# Patient Record
Sex: Female | Born: 2000 | Hispanic: Yes | Marital: Single | Smoking: Never smoker
Health system: Southern US, Community
[De-identification: ages and names within clinical notes are randomized; demographics above are authoritative.]

## PROBLEM LIST (undated history)

## (undated) DIAGNOSIS — J45909 Unspecified asthma, uncomplicated: Secondary | ICD-10-CM

## (undated) HISTORY — PX: NO PAST SURGERIES: SHX2092

---
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
## 2020-01-15 ENCOUNTER — Encounter: Payer: Self-pay | Admitting: Women's Health

## 2020-01-15 ENCOUNTER — Ambulatory Visit (INDEPENDENT_AMBULATORY_CARE_PROVIDER_SITE_OTHER): Payer: Medicaid Other | Admitting: Women's Health

## 2020-01-15 ENCOUNTER — Other Ambulatory Visit: Payer: Medicaid Other

## 2020-01-15 VITALS — BP 109/73 | HR 100 | Wt 185.6 lb

## 2020-01-15 DIAGNOSIS — Z131 Encounter for screening for diabetes mellitus: Secondary | ICD-10-CM

## 2020-01-15 DIAGNOSIS — Z3402 Encounter for supervision of normal first pregnancy, second trimester: Secondary | ICD-10-CM

## 2020-01-15 DIAGNOSIS — Z3A25 25 weeks gestation of pregnancy: Secondary | ICD-10-CM | POA: Diagnosis not present

## 2020-01-15 DIAGNOSIS — Z1389 Encounter for screening for other disorder: Secondary | ICD-10-CM

## 2020-01-15 DIAGNOSIS — Z331 Pregnant state, incidental: Secondary | ICD-10-CM

## 2020-01-15 DIAGNOSIS — Z3480 Encounter for supervision of other normal pregnancy, unspecified trimester: Secondary | ICD-10-CM

## 2020-01-15 LAB — POCT URINALYSIS DIPSTICK OB
Blood, UA: NEGATIVE
Glucose, UA: NEGATIVE
Ketones, UA: NEGATIVE
Leukocytes, UA: NEGATIVE
Nitrite, UA: NEGATIVE
POC,PROTEIN,UA: NEGATIVE

## 2020-01-15 NOTE — Progress Notes (Signed)
LOW-RISK PREGNANCY VISIT Patient name: Nichole [name] MRN [id number]  Date of birth: [birth year] Chief Complaint:   Routine Prenatal Visit  History of Present Illness:   Nichole [name] is a 19 y.o. G1P0 female at [redacted]w[redacted]d with an Estimated Date of Delivery: 04/23/20 being seen today for ongoing management of a low-risk pregnancy.  Depression screen [facility] 2/9 10/21/2019 08/26/2019 02/26/2019 11/21/2017 05/15/2017  Decreased Interest 0 0 0 0 0  Down, Depressed, Hopeless 0 0 0 0 0  PHQ - 2 Score 0 0 0 0 0  Altered sleeping 0 - - - -  Tired, decreased energy 1 - - - -  Change in appetite 0 - - - -  Feeling bad or failure about yourself  0 - - - -  Trouble concentrating 1 - - - -  Moving slowly or fidgety/restless 0 - - - -  Suicidal thoughts 0 - - - -  PHQ-9 Score 2 - - - -    Today she reports one breast leaking a little milk. Contractions: Irritability. Vag. Bleeding: None.  Movement: Present. denies leaking of fluid. Review of Systems:   Pertinent items are noted in HPI Denies abnormal vaginal discharge w/ itching/odor/irritation, headaches, visual changes, shortness of breath, chest pain, abdominal pain, severe nausea/vomiting, or problems with urination or bowel movements unless otherwise stated above. Pertinent History Reviewed:  Reviewed past medical,surgical, social, obstetrical and family history.  Reviewed problem list, medications and allergies. Physical Assessment:   Vitals:   01/15/20 0935  BP: 109/73  Pulse: 100  Weight: 185 lb 9.6 oz (84.2 kg)  Body mass index is 31.86 kg/m.        Physical Examination:   General appearance: Well appearing, and in no distress  Mental status: Alert, oriented to person, place, and time  Skin: Warm & dry  Cardiovascular: Normal heart rate noted  Respiratory: Normal respiratory effort, no distress  Abdomen: Soft, gravid, nontender  Pelvic: Cervical exam deferred         Extremities:    Fetal Status:     Movement: Present    Chaperone: Clint Bolder   Results for orders placed or performed in visit on 01/15/20 (from the past 24 hour(s))  POC Urinalysis Dipstick OB   Collection Time: 01/15/20  9:38 AM  Result Value Ref Range   Color, UA     Clarity, UA     Glucose, UA Negative Negative   Bilirubin, UA     Ketones, UA neg    Spec Grav, UA     Blood, UA neg    pH, UA     POC,PROTEIN,UA Negative Negative, Trace, Small (1+), Moderate (2+), Large (3+), 4+   Urobilinogen, UA     Nitrite, UA neg    Leukocytes, UA Negative Negative   Appearance     Odor      Assessment & Plan:  1) Low-risk pregnancy G1P0 at [redacted]w[redacted]d with an Estimated Date of Delivery: 04/23/20    Meds: No orders of the defined types were placed in this encounter.  Labs/procedures today: pn2  Plan:  Continue routine obstetrical care  Next visit: prefers in person    Reviewed: Preterm labor symptoms and general obstetric precautions including but not limited to vaginal bleeding, contractions, leaking of fluid and fetal movement were reviewed in detail with the patient.  All questions were answered. Has home bp cuff.  Check bp weekly, let us know if >140/90.   Follow-up: No follow-ups on file.  Orders Placed  This Encounter  Procedures   POC Urinalysis Dipstick OB   Cheral Marker CNM, [facility] 01/15/2020 9:43 AM

## 2020-01-15 NOTE — Patient Instructions (Signed)
Nichole [name], I greatly value your feedback.  If you receive a survey following your visit with [name] today, we appreciate you taking the time to fill it out.  Thanks, [name], CNM, WHNP-BC   [facility] ([address] [place], [place] [address]) Entrance C, located off of [address] valet parking  Go to [email or web address] to register for FREE online childbirth classes   Call the office [phone]) or go to [facility] if:  You begin to have strong, frequent contractions  Your water breaks.  Sometimes it is a big gush of fluid, sometimes it is just a trickle that keeps getting your panties wet or running down your legs  You have vaginal bleeding.  It is normal to have a small amount of spotting if your cervix was checked.   You don't feel your baby moving like normal.  If you don't, get you something to eat and drink and lay down and focus on feeling your baby move.  You should feel at least 10 movements in 2 hours.  If you don't, you should call the office or go to [facility].    Tdap Vaccine  It is recommended that you get the Tdap vaccine during the third trimester of EACH pregnancy to help protect your baby from getting pertussis (whooping cough)  27-36 weeks is the BEST time to do this so that you can pass the protection on to your baby. During pregnancy is better than after pregnancy, but if you are unable to get it during pregnancy it will be offered at the hospital.   You can get this vaccine with [name], at the health department, your family doctor, or some local pharmacies  Everyone who will be around your baby should also be up-to-date on their vaccines before the baby comes. Adults (who are not pregnant) only need 1 dose of Tdap during adulthood.    Pediatricians/Family Doctors:  [facility] [phone]            [facility] [phone]                 [facility]  [phone] (usually not accepting new patients unless you have family there already, you are always welcome to call and ask)       [facility] Department [phone]       [facility] Pediatricians/Family Doctors:   [facility]: [phone]  [facility]: [phone]  [facility]: [phone]  [facility] Doctors:   [facility]: [phone]   [facility]: [phone]  [facility] Doctors:  [facility]: [phone]   Home Blood Pressure Monitoring for Patients   Your provider has recommended that you check your blood pressure (BP) at least once a week at home. If you do not have a blood pressure cuff at home, one will be provided for you. Contact your provider if you have not received your monitor within 1 week.   Helpful Tips for Accurate Home Blood Pressure Checks  . Don't smoke, exercise, or drink caffeine 30 minutes before checking your BP . Use the restroom before checking your BP (a full bladder can raise your pressure) . Relax in a comfortable upright chair . Feet on the ground . Left arm resting comfortably on a flat surface at the level of your heart . Legs uncrossed . Back supported . Sit quietly and don't talk . Place the cuff on your bare  arm . Adjust snuggly, so that only two fingertips can fit between your skin and the top of the cuff . Check 2 readings separated by at least one minute . Keep a log of your BP readings . For a visual, please reference this diagram: http://ccnc.care/bpdiagram  Provider Name: [facility]     Phone: [phone]  Zone 1: ALL CLEAR  Continue to monitor your symptoms:  . BP reading is less than 140 (top [name]) or less than 90 (bottom [name])  . No right upper stomach pain . No headaches or seeing spots . No feeling nauseated or throwing up . No swelling in face and hands  Zone 2: CAUTION Call your  doctor's office for any of the following:  . BP reading is greater than 140 (top [name]) or greater than 90 (bottom [name])  . Stomach pain under your ribs in the middle or right side . Headaches or seeing spots . Feeling nauseated or throwing up . Swelling in face and hands  Zone 3: EMERGENCY  Seek immediate medical care if you have any of the following:  . BP reading is greater than160 (top [name]) or greater than 110 (bottom [name]) . Severe headaches not improving with Tylenol . Serious difficulty catching your breath . Any worsening symptoms from Zone 2    Tercer trimestre de Psychiatrist Third Trimester of Pregnancy El tercer trimestre comprende desde la [employer] la semana40 (desde el mes7 hasta el mes9). El tercer trimestre es un perodo en el que el beb en gestacin (feto) crece rpidamente. Hacia el final del noveno mes, el feto mide alrededor de 20pulgadas (45cm) de largo y pesa entre 6 y 10 libras (2,700 y 57,500kg). Cambios en el cuerpo durante el tercer trimestre Su organismo continuar atravesando por muchos cambios durante el [place]. Estos cambios varan de [place]. Durante el tercer trimestre:  Seguir aumentando de [place]. Es de esperar que aumente entre 25 y 35libras (11 y 16kg) hacia el final del Psychiatrist.  Podrn aparecer las primeras Albertson's caderas, el abdomen y las [place].  Puede tener necesidad de Geographical information systems officer con ms frecuencia porque el feto baja hacia la pelvis y ejerce presin sobre la vejiga.  Puede desarrollar o continuar teniendo Merchant navy officer. Esto se debe a que el aumento de las hormonas hace que los msculos en el tubo digestivo trabajen ms lentamente.  Puede desarrollar o continuar teniendo estreimiento debido a que el aumento de las hormonas ralentiza la digestin y hace que los msculos que [employer] desechos a travs de los intestinos se relajen.  Puede desarrollar hemorroides. Estas son venas hinchadas (venas varicosas)  en el recto que pueden causar picazn o dolor.  Puede desarrollar venas hinchadas y abultadas (venas varicosas) en las piernas.  Puede presentar ms dolor en la pelvis, la espalda o los muslos. Esto se debe al Citigroup de peso y al aumento de las hormonas que relajan las articulaciones.  Tal vez haya cambios en el cabello. Esto cambios pueden incluir su engrosamiento, crecimiento rpido y [employer] Waste Industries textura. Adems, a algunas mujeres se les cae el cabello durante o despus del embarazo, o tienen el cabello seco o fino. Lo ms probable es que el cabello se le normalice despus del nacimiento del beb.  Sus pechos seguirn creciendo y se pondrn cada vez ms sensibles. Un lquido amarillo Charity fundraiser) puede salir de sus pechos. Esta es la primera leche que usted produce para su beb.  El ombligo puede salir May  afuera.  Puede observar que se le Eli Lilly and Company, el rostro o los tobillos.  Puede presentar un aumento del hormigueo o entumecimiento en las manos, brazos y piernas. La piel de su vientre tambin puede sentirse entumecida.  Puede sentir que le falta el aire debido a que se expande el tero.  Puede tener ms problemas para dormir. Esto puede deberse al tamao de su vientre, una mayor necesidad de Geographical information systems officer y un aumento en el metabolismo de su cuerpo.  Puede notar que el feto "baja" o lo siente ms bajo, en el abdomen (aligeramiento).  Puede tener un aumento de la secrecin vaginal.  Puede notar que las articulaciones se sienten flojas y puede sentir dolor alrededor del hueso plvico. Qu debe esperar en las visitas prenatales Le harn exmenes prenatales cada 2semanas hasta la semana36. A partir de ese momento le harn los Lex[employer] International. Durante una visita prenatal de rutina:  La pesarn para asegurarse de que usted y el beb estn creciendo normalmente.  Le tomarn la presin arterial.  Le medirn el abdomen para controlar el desarrollo del beb.  Se escucharn los  latidos cardacos fetales.  Se evaluarn los resultados de los estudios solicitados en visitas anteriores.  Le revisarn el cuello del tero cuando est prxima la fecha de parto para controlar si el cuello uterino se ha afinado o adelgazado (borrado).  Le harn una prueba de estreptococos del grupo B. Esto sucede AutoNation 35 y 33. El mdico puede preguntarle lo siguiente:  Cmo le gustara que fuera el [place].  Cmo se siente.  Si siente los movimientos del beb.  Si ha tenido sntomas anormales, como prdida de lquido, [place], dolores de cabeza intensos o clicos abdominales.  Si est consumiendo algn producto que contenga tabaco, como cigarrillos, tabaco de Theatre manager y Administrator, Civil Service.  Si tiene Colgate-Palmolive. Otros exmenes o estudios de deteccin que pueden realizarse durante el tercer trimestre incluyen lo siguiente:  Anlisis de sangre para controlar los niveles de hierro (anemia).  Controles fetales para determinar su salud, nivel de [place] y Designer, jewellery. Si tiene [place] enfermedad o hay problemas durante el embarazo, le harn estudios.  Prueba sin estrs. Esta prueba verifica la salud de su beb y se [place] para Engineer, manufacturing signos de problemas, tales como si el beb no est recibiendo suficiente oxgeno. Durante esta prueba, se coloca un cinturn alrededor de su vientre. Al moverse el beb, se controla su frecuencia cardaca. Qu es el falso [place] de [place]? El falso trabajo de parto es una afeccin en la que se sienten pequeos e irregulares espasmos de los msculos del tero (contracciones) que generalmente desaparecen al hacer reposo, cambiar de posicin o al beber agua. Estas contracciones se llaman contracciones de CSX Corporation. Las Fifth Third Bancorp pueden durar horas, [address] o incluso semanas, antes de que el verdadero trabajo de parto se inicie. Si las contracciones ocurren a intervalos regulares, se vuelven ms frecuentes, aumentan en intensidad o se  vuelven dolorosas, debera ver al mdico.  Cules son los signos del [place] de [place]?  Clicos abdominales.  Contracciones regulares que comienzan en intervalos de 10 minutos y se vuelven ms fuertes y ms frecuentes con el tiempo.  Contracciones que comienzan en la parte superior del tero y se extienden hacia abajo, a la zona inferior del abdomen y la espalda.  Aumento de la presin en la pelvis y Armed forces logistics/support/administrative officer en la espalda.  Una secrecin de mucosidad acuosa o con sangre que sale de la vagina.  Prdida de  lquido amnitico. Esto tambin se conoce como "ruptura de la bolsa de las aguas". Esto puede ser un chorro o un goteo constante y lento de lquido. Informe a su mdico si tiene un color u [employer]. Si tiene alguno de F[employer], llame a su mdico de inmediato, incluso si es antes de la fecha de [place]. Siga estas indicaciones en su casa: Medicamentos  Siga las indicaciones del mdico en relacin con el uso de medicamentos. Durante el embarazo, hay medicamentos que pueden tomarse y otros que no.  Tome vitaminas prenatales que contengan por lo menos (?g) de cido flico.  Si est estreida, tome un laxante suave, si el mdico lo autoriza. Qu debe comer y beber   Meriel Flavors una dieta equilibrada que incluya gran cantidad de frutas y verduras frescas, cereales integrales, buenas fuentes de protenas como carnes [place], huevos o tofu, y lcteos descremados. El mdico la ayudar a Production assistant, radio cantidad de peso que puede [place].  No coma carne cruda ni quesos sin cocinar. Estos elementos contienen grmenes que pueden causar defectos congnitos en el beb.  Si no consume muchos alimentos con calcio, hable con su mdico sobre si debera tomar un suplemento diario de calcio.  La ingesta diaria de cuatro o cinco comidas pequeas en lugar de tres comidas abundantes.  Limite el consumo de alimentos con alto contenido de grasas y azcares procesados, como alimentos  fritos o dulces.  Para evitar el estreimiento: ? Bebe suficiente lquido para mantener la orina clara o de color amarillo plido. ? Consuma alimentos ricos en fibra, como frutas y verduras frescas, cereales integrales y frijoles. Actividad  Haga ejercicio solamente como se lo haya indicado el mdico. La mayora de las mujeres pueden continuar su rutina de ejercicios durante el [place]. Intente realizar como mnimo de actividad fsica por lo menos 5das a la semana. Deje de hacer ejercicio si experimenta contracciones uterinas.  Evite levantar pesos [employer] Brands.  No haga ejercicio en condiciones de calor o humedad extremas, o a grandes alturas.  Use zapatos cmodos de tacn bajo.  Adopte una buena postura.  Puede seguir teniendo [employer] [place] [place] Travelers, excepto que el mdico le diga lo contrario. Alivio del dolor y del [place] pausas frecuentes y descanse con las piernas elevadas si tiene calambres en las piernas o dolor en la zona lumbar.  Dese baos de asiento con agua tibia para Engineer, materials o las molestias causadas por las hemorroides. Use una crema para las hemorroides si el mdico la autoriza.  Use un sostn que le brinde buen soporte para prevenir las molestias causadas por la sensibilidad en los pechos.  Si tiene venas varicosas: ? Use pantimedias que brinden soporte o medias de compresin como se lo haya indicado el mdico. ? Eleve los pies durante , 3 o 4veces por da. Cuidados prenatales  Escriba sus preguntas. Llvelas cuando concurra a las visitas prenatales.  Concurra a todas las visitas prenatales tal como se lo haya indicado el mdico. Esto es importante. Seguridad  Use el cinturn de seguridad en todo momento mientras conduce.  Haga una lista de los nmeros de telfono de Associate Professor, que W. [employer] nmeros de telfono de familiares, [place], el hospital y los departamentos de polica y bomberos. Instrucciones generales  Evite el  contacto con las bandejas sanitarias de los gatos y la tierra que estos animales usan. Estos elementos contienen grmenes que pueden causar defectos congnitos en el beb. Si tiene Financial controller, pdale a alguien que limpie la caja  de arena por usted.  No haga viajes largos excepto que sea absolutamente necesario y solo con la autorizacin de su mdico.  No se d baos de inmersin en agua caliente, baos turcos ni saunas.  No beber alcohol.  No consuma ningn producto que contenga nicotina o tabaco, como cigarrillos y Administrator, Civil Service. Si necesita ayuda para dejar de fumar, consulte al mdico.  No use hierbas medicinales ni medicamentos que no le hayan recetado. Estas sustancias qumicas afectan la formacin y el desarrollo del beb.  No se haga duchas vaginales ni use tampones o toallas higinicas perfumadas.  No mantenga las piernas cruzadas durante largos periodos de [place].  Para prepararse para la llegada de su beb: ? Tome clases prenatales para entender, Education administrator, y hacer preguntas sobre el P[place] de parto y [facility]. ? Haga un ensayo de la partida al hospital. ? Visite el hospital y recorra el rea de maternidad. ? Pida un permiso de maternidad o paternidad a sus empleadores. ? Organice para que Research scientist (physical sciences) o amigo cuide a sus mascotas mientras usted est en el hospital. ? Compre un asiento de seguridad FirstEnergy Corp, y asegrese de saber cmo instalarlo en su automvil. ? Prepare el bolso que llevar al hospital. ? Prepare la habitacin del beb. Asegrese de quitar todas las almohadas y [place] de peluche de la cuna del beb para evitar la asfixia.  Visite a su dentista si no lo ha Occupational hygienist. Use un cepillo de dientes blando para higienizarse los dientes y psese el hilo dental con suavidad. Comunquese con un mdico si:  No est segura de que est en trabajo de parto o de que ha roto la bolsa de las aguas.  Se siente mareada.  Siente  clicos leves, presin en la pelvis o dolor persistente en el abdomen.  Siente dolor en la parte inferior de la espalda.  Tiene nuseas, vmitos o diarrea persistentes.  Brett Fairy secrecin vaginal inusual o con mal olor.  Siente dolor al [employer]oco[employer]illips. Solicite ayuda de inmediato si:  Rompe la bolsa de las aguas antes de la semana 37.  Tiene contracciones regulares en intervalos de menos de 5 minutos antes de la semana 37.  Tiene fiebre.  Tiene una prdida de lquido por la vagina.  Tiene sangrado o pequeas prdidas vaginales.  Tiene dolor o clicos abdominales intensos.  Baja de peso o sube de peso rpidamente.  Tiene dificultad para respirar y siente dolor de pecho.  Sbitamente se le hinchan mucho el rostro, las [place], los tobillos, los pies o las piernas.  Su beb se mueve menos de 10 veces en 2 horas.  Siente un dolor de cabeza intenso que no se alivia al tomar United Parcel.  Nota cambios en la visin. Resumen  El tercer trimestre comprende desde la semana28 [employer] semana40, es decir, desde el mes7 hasta el [address] El tercer trimestre es un perodo en el que el beb en gestacin (feto) crece rpidamente.  Durante el tercer trimestre, su incomodidad puede aumentar a medida que usted y su beb continan aumentando de [place]. Es posible que tenga dolor abdominal, en las piernas y en la [place], [place] para dormir y [place] mayor necesidad de Geographical information systems officer.  Durante el tercer trimestre, sus pechos seguirn creciendo y se pondrn cada vez ms sensibles. Un lquido amarillo Charity fundraiser) puede salir de sus pechos. Esta es la primera leche que usted produce para su beb.  El falso trabajo de parto es una afeccin en la que se sienten  pequeos e irregulares espasmos de los msculos del tero (contracciones) que a Corporate investment bankerla larga desaparecen. Estas contracciones se llaman contracciones de CSX CorporationBraxton Hicks. Las Fifth Third Bancorpcontracciones pueden durar horas, [address]  [address] o incluso semanas, antes de que el verdadero trabajo  de parto se inicie.  Los signos del trabajo de parto pueden incluir: calambres abdominales; contracciones regulares que comienzan en intervalos de 10 minutos y se vuelven ms fuertes y ms frecuentes con el tiempo; una secrecin de mucosidad acuosa o con sangre que sale de la vagina; aumento de la presin en la pelvis y Engineer, miningdolor latente en la espalda; y prdida de lquido amnitico. Esta informacin no tiene Theme park managercomo fin reemplazar el consejo del mdico. Asegrese de hacerle al mdico cualquier pregunta que tenga. Document Revised: 07/17/2016 Document Reviewed: 07/17/2016 Elsevier Patient Education  2020 [employer]

## 2020-01-16 LAB — CBC
Hematocrit: 36 % (ref 34.0–46.6)
Hemoglobin: 12 g/dL (ref 11.1–15.9)
MCH: 29.3 pg (ref 26.6–33.0)
MCHC: 33.3 g/dL (ref 31.5–35.7)
MCV: 88 fL (ref 79–97)
Platelets: 191 10*3/uL (ref 150–450)
RBC: 4.09 x10E6/uL (ref 3.77–5.28)
RDW: 12.3 % (ref 11.7–15.4)
WBC: 7.9 10*3/uL (ref 3.4–10.8)

## 2020-01-16 LAB — GLUCOSE TOLERANCE, 2 HOURS W/ 1HR
Glucose, 1 hour: 116 mg/dL (ref 65–179)
Glucose, 2 hour: 96 mg/dL (ref 65–152)
Glucose, Fasting: 74 mg/dL (ref 65–91)

## 2020-01-16 LAB — RPR: RPR Ser Ql: NONREACTIVE

## 2020-01-16 LAB — ANTIBODY SCREEN: Antibody Screen: NEGATIVE

## 2020-01-16 LAB — HIV ANTIBODY (ROUTINE TESTING W REFLEX): HIV Screen 4th Generation wRfx: NONREACTIVE

## 2020-02-09 DIAGNOSIS — Z23 Encounter for immunization: Secondary | ICD-10-CM | POA: Diagnosis not present

## 2020-02-09 DIAGNOSIS — S91332A Puncture wound without foreign body, left foot, initial encounter: Secondary | ICD-10-CM | POA: Diagnosis not present

## 2020-02-13 ENCOUNTER — Inpatient Hospital Stay (HOSPITAL_COMMUNITY)
Admission: EM | Admit: 2020-02-13 | Discharge: 2020-02-13 | Disposition: A | Discharge status: Home / Self Care | Payer: Medicaid Other | Attending: Family Medicine | Admitting: Family Medicine

## 2020-02-13 ENCOUNTER — Encounter (HOSPITAL_COMMUNITY): Payer: Self-pay | Admitting: Family Medicine

## 2020-02-13 ENCOUNTER — Other Ambulatory Visit: Payer: Self-pay

## 2020-02-13 DIAGNOSIS — O212 Late vomiting of pregnancy: Secondary | ICD-10-CM | POA: Diagnosis not present

## 2020-02-13 DIAGNOSIS — R197 Diarrhea, unspecified: Secondary | ICD-10-CM | POA: Insufficient documentation

## 2020-02-13 DIAGNOSIS — R1084 Generalized abdominal pain: Secondary | ICD-10-CM | POA: Insufficient documentation

## 2020-02-13 DIAGNOSIS — E876 Hypokalemia: Secondary | ICD-10-CM | POA: Insufficient documentation

## 2020-02-13 DIAGNOSIS — O99891 Other specified diseases and conditions complicating pregnancy: Secondary | ICD-10-CM | POA: Diagnosis not present

## 2020-02-13 DIAGNOSIS — Z3A30 30 weeks gestation of pregnancy: Secondary | ICD-10-CM | POA: Insufficient documentation

## 2020-02-13 DIAGNOSIS — O99283 Endocrine, nutritional and metabolic diseases complicating pregnancy, third trimester: Secondary | ICD-10-CM | POA: Insufficient documentation

## 2020-02-13 DIAGNOSIS — O26893 Other specified pregnancy related conditions, third trimester: Secondary | ICD-10-CM | POA: Diagnosis present

## 2020-02-13 DIAGNOSIS — R195 Other fecal abnormalities: Secondary | ICD-10-CM

## 2020-02-13 DIAGNOSIS — N858 Other specified noninflammatory disorders of uterus: Secondary | ICD-10-CM | POA: Diagnosis not present

## 2020-02-13 DIAGNOSIS — R112 Nausea with vomiting, unspecified: Secondary | ICD-10-CM

## 2020-02-13 LAB — CBC
HCT: 32.7 % — ABNORMAL LOW (ref 36.0–46.0)
Hemoglobin: 11.1 g/dL — ABNORMAL LOW (ref 12.0–15.0)
MCH: 29 pg (ref 26.0–34.0)
MCHC: 33.9 g/dL (ref 30.0–36.0)
MCV: 85.4 fL (ref 80.0–100.0)
Platelets: 178 10*3/uL (ref 150–400)
RBC: 3.83 MIL/uL — ABNORMAL LOW (ref 3.87–5.11)
RDW: 12.1 % (ref 11.5–15.5)
WBC: 9.9 10*3/uL (ref 4.0–10.5)
nRBC: 0 % (ref 0.0–0.2)

## 2020-02-13 LAB — URINALYSIS, ROUTINE W REFLEX MICROSCOPIC
Bacteria, UA: NONE SEEN
Glucose, UA: NEGATIVE mg/dL
Hgb urine dipstick: NEGATIVE
Ketones, ur: 5 mg/dL — AB
Nitrite: NEGATIVE
Protein, ur: 100 mg/dL — AB
Specific Gravity, Urine: 1.026 (ref 1.005–1.030)
pH: 7 (ref 5.0–8.0)

## 2020-02-13 LAB — COMPREHENSIVE METABOLIC PANEL
ALT: 12 U/L (ref 0–44)
AST: 17 U/L (ref 15–41)
Albumin: 3.1 g/dL — ABNORMAL LOW (ref 3.5–5.0)
Alkaline Phosphatase: 68 U/L (ref 38–126)
Anion gap: 13 (ref 5–15)
BUN: <5 mg/dL — ABNORMAL LOW (ref 6–20)
CO2: 22 mmol/L (ref 22–32)
Calcium: 9.1 mg/dL (ref 8.9–10.3)
Chloride: 102 mmol/L (ref 98–111)
Creatinine, Ser: 0.52 mg/dL (ref 0.44–1.00)
GFR, Estimated: >60 mL/min (ref >60)
Glucose, Bld: 116 mg/dL — ABNORMAL HIGH (ref 70–99)
Potassium: 3.2 mmol/L — ABNORMAL LOW (ref 3.5–5.1)
Sodium: 137 mmol/L (ref 135–145)
Total Bilirubin: 0.6 mg/dL (ref 0.3–1.2)
Total Protein: 6.4 g/dL — ABNORMAL LOW (ref 6.5–8.1)

## 2020-02-13 LAB — FETAL FIBRONECTIN: Fetal Fibronectin: NEGATIVE

## 2020-02-13 MED ORDER — SIMETHICONE 80 MG PO CHEW
80.0000 mg | CHEWABLE_TABLET | Freq: Once | ORAL | Status: AC
  Administered 2020-02-13: 80 mg via ORAL
  Filled 2020-02-13: qty 1

## 2020-02-13 MED ORDER — PROMETHAZINE HCL 25 MG PO TABS: 25.0000 mg | ORAL_TABLET | Freq: Four times a day (QID) | ORAL | 2 refills | Status: DC | PRN

## 2020-02-13 MED ORDER — POTASSIUM CHLORIDE ER 10 MEQ PO TBCR: 10.0000 meq | EXTENDED_RELEASE_TABLET | Freq: Every day | ORAL | 0 refills | Status: DC

## 2020-02-13 MED ORDER — LACTATED RINGERS IV SOLN: Freq: Once | INTRAVENOUS | Status: AC

## 2020-02-13 MED ORDER — HYOSCYAMINE SULFATE 0.125 MG SL SUBL
0.1250 mg | SUBLINGUAL_TABLET | Freq: Once | SUBLINGUAL | Status: AC
  Administered 2020-02-13: 0.125 mg via SUBLINGUAL
  Filled 2020-02-13: qty 1

## 2020-02-13 NOTE — MAU Note (Signed)
PT SAYS SHE WENT TO ER - LOWER ABD - STARTED AT 7 PM- . NOW FEELS LESS THAN AT 7 . [facility].

## 2020-02-13 NOTE — Discharge Instructions (Signed)
Hipopotasemia Hypokalemia Hipopotasemia significa que el nivel de potasio en sangre es menor que lo normal. El potasio es una sustancia qumica (electrolito) que ayuda a regular la cantidad de lquido en el organismo. Tambin estimula la tensin (contraccin) muscular y ayuda a que los nervios funcionen correctamente. Normalmente, la mayor parte del potasio del organismo se encuentra dentro de las clulas y solo una cantidad muy pequea est en la sangre. Debido a que la cantidad en la sangre es tan pequea, un cambio pequeo en los niveles de potasio en la sangre puede ser potencialmente mortal. Cules son las causas? Esta afeccin puede ser causada por lo siguiente:  Antibiticos.  Diarrea o vmitos. Tomar demasiada cantidad de un medicamento que lo ayuda a Warehouse manager deposiciones (laxante) puede causar diarrea y derivar en hipopotasemia.  Enfermedad renal crnica (ERC).  Medicamentos que ayudan al cuerpo a eliminar el exceso de lquido (diurticos).  Trastornos de Psychologist, sport and exercise, como la bulimia.  Niveles bajos de [employer].  Sudoracin abundante. Cules son los signos o los sntomas? Los sntomas de esta afeccin incluyen:  Debilidad.  Estreimiento.  Fatiga.  Calambres musculares.  Confusin mental.  Latidos cardacos salteados o irregulares (palpitaciones).  Hormigueo o entumecimiento. Cmo se diagnostica? Esta afeccin se diagnostica con un anlisis de sangre. Cmo se trata? El tratamiento para esta afeccin puede incluir lo siguiente:  Consumo de suplementos de potasio por la boca.  Ajuste de los medicamentos que toma.  Consumo de ms alimentos que contengan una gran cantidad de potasio. Si sus niveles de potasio son muy bajos, posiblemente sea necesario que reciba potasio a travs de una va intravenosa y se lo controle en el hospital. Siga estas indicaciones en su casa:   Tome los medicamentos de venta libre y los recetados solamente como se  lo haya indicado el mdico. Esto incluye las vitaminas y suplementos.  Siga una dieta saludable. Una dieta saludable incluye frutas y verduras frescas, cereales integrales, grasas saludables y protenas magras.  Si se lo indican, consuma ms alimentos que contengan una gran cantidad de potasio. Esto puede comprender lo siguiente: ? Frutos secos, como cacahuetes y Psychologist, prison and probation services. ? Semillas, como semillas de girasol y de Land. ? Porotos, guisantes secos y lentejas. ? Granos enteros y panes y cereales con salvado. ? [place] y verduras frescas, como damascos, palta, bananas, meln, kiwi, naranjas, tomates, esprragos y papas. ? Jugo de naranjas. ? Jugo de tomate. ? Carnes rojas. ? Yogur.  Concurra a todas las visitas de control como se lo haya indicado el mdico. Esto es importante. Comunquese con un mdico si:  Tiene debilidad que empeora.  Siente que el corazn late fuerte o est acelerado.  Vomita.  Tiene diarrea.  Tiene diabetes (diabetes mellitus) y tiene problemas para mantener el nivel de azcar en la sangre (glucosa) en el rango indicado. Solicite ayuda inmediatamente si:  Midwife.  Le falta el aire.  Tiene vmitos o diarrea durante ms de 2das.  Se desmaya. Resumen  Hipopotasemia significa que el nivel de potasio en sangre es menor que lo normal.  Esta afeccin se diagnostica con un anlisis de [place].  La hipopotasemia puede tratarse tomando suplementos de potasio, ajustando los medicamentos que toma o comiendo ms alimentos con alto contenido de Government social research officer.  Si sus niveles de potasio son muy bajos, posiblemente sea necesario que reciba potasio a travs de una va intravenosa y se lo controle en el hospital. Esta informacin no tiene Theme park manager el consejo del mdico.  Asegrese de hacerle al mdico cualquier pregunta que tenga. Document Revised: 12/04/2017 Document Reviewed: 12/04/2017 Elsevier Patient Education  2020 Elsevier  Inc.   Nuseas y vmitos en los adultos Nausea and Vomiting, Adult Las nuseas son la sensacin de Dentist en el estmago o de que se est por vomitar. Los vmitos se producen cuando los alimentos que estn en el estmago se expulsan por la boca. Vomitar puede hacerlo sentir dbil. Tambin puede hacerlo perder gran cantidad de agua del cuerpo (deshidratarse). Si pierde demasiada agua del cuerpo, es posible que usted:  Se sienta cansado.  Sienta sed.  Tenga la boca seca.  Tenga los labios agrietados.  Haga pis (orine) con menos frecuencia. Los [employer] y las personas que tienen otras enfermedades o un sistema de defensa del cuerpo (sistema inmunitario) dbil corren un mayor riesgo de perder demasiada agua del cuerpo. Si siente Journalist, newspaper y vomita, es importante que siga las indicaciones del mdico acerca de cmo cuidarse. Siga estas indicaciones en su casa: Controle sus sntomas para detectar cualquier cambio. Dgale a su mdico sobre ellos. Siga estas indicaciones para cuidarse en su hogar. Comida y bebida      Beverely Risen SRO (solucin de rehidratacin oral). Es Neomia Dear bebida que se vende en farmacias y tiendas.  Beba lquidos claros en pequeas cantidades segn le sea posible, por ejemplo: ? [place]. ? Trocitos de hielo. ? Jugo de frutas con agua agregada (jugo de frutas diluido). ? Bebidas deportivas de bajas caloras.  En la medida en que pueda, consuma alimentos blandos y fciles de digerir en pequeas cantidades, por ejemplo, los siguientes: ? Bananas. ? Pur de [employer]. ? Arroz. ? Carnes bajas en grasa Nonah Mattes). ? Pan tostado. ? Galletas.  Evite beber lquidos con alto contenido de azcar o cafena. Esto incluye bebidas energticas, bebidas deportivas y gaseosas.  Evite tomar alcohol.  Evite los alimentos condimentados o con alto contenido de [place]. Indicaciones generales  [employer] de venta libre y los recetados solamente como se lo haya  indicado el mdico.  Beba suficiente lquido para mantener el pis (la orina) de color amarillo plido.  Lvese las manos frecuentemente con agua y [place]. Use un desinfectante para manos si no dispone de [place] y [place].  Asegrese de que todas las personas que viven en su casa se laven bien las manos y con frecuencia.  Descanse en su casa hasta sentirse mejor.  Controle su afeccin para Insurance risk surveyor cambio.  Cuando Careers adviser, respire lenta y profundamente.  Concurra a todas las visitas de control como se lo haya indicado el mdico. Esto es importante. Comunquese con un mdico si:  Sus sntomas empeoran.  Aparecen nuevos sntomas.  Tiene fiebre.  No puede beber lquidos sin vomitar.  Siente malestar en el estmago durante ms de 2das.  Se siente aturdido o mareado.  Tiene dolor de [place].  Tiene calambres musculares.  Tiene una erupcin cutnea.  Siente dolor al [employer]. Solicite ayuda inmediatamente si:  [employer], el cuello, los brazos o la [place].  Se siente muy dbil o se desvanece (se desmaya).  Vomita repetidamente.  Vomita sangre de color rojo intenso o el vmito se asemeja al poso del caf.  Tiene deposiciones (heces) con sangre o de color negro, o con aspecto alquitranado.  Siente un dolor de cabeza intenso, rigidez en el cuello, o ambas cosas.  Tiene dolor muy intenso en el vientre (abdomen), clicos o meteorismo.  Tiene dificultad para  respirar.  Respira muy rpido.  Su corazn late muy rpidamente.  Siente la piel fra y hmeda.  Se siente confundido.  Tiene signos de haber perdido [place] cantidad excesiva de agua del cuerpo, como: ? [place], muy escasa o falta de [place]. ? Labios agrietados. ? Sequedad de boca. ? Ojos hundidos. ? Somnolencia. ? Debilidad. Estos sntomas pueden Customer service manager. No espere a ver si los sntomas desaparecen. Solicite atencin mdica de inmediato.  Comunquese con el servicio de emergencias de su localidad (911 en los [place]). No conduzca por sus propios medios [employer] hospital. Resumen  Las nuseas son la sensacin de Dentist en el estmago o de que se est por vomitar. Los vmitos se producen cuando los alimentos que estn en el estmago se expulsan por la boca.  Siga las indicaciones de su mdico acerca de qu comer y beber para evitar perder demasiada agua del cuerpo.  Tome los medicamentos de venta libre y los recetados solamente como se lo haya indicado el mdico.  Comunquese con su mdico si sus sntomas empeoran o si tiene nuevos sntomas.  Concurra a todas las visitas de control como se lo haya indicado el mdico. Esto es importante. Esta informacin no tiene Theme park manager el consejo del mdico. Asegrese de hacerle al mdico cualquier pregunta que tenga. Document Revised: 10/24/2017 Document Reviewed: 10/24/2017 Elsevier Patient Education  2020 [employer].

## 2020-02-13 NOTE — MAU Provider Note (Signed)
Chief Complaint:  Abdominal Pain   First Provider Initiated Contact with Patient 02/13/20 0141     HPI: Nichole [name] is a 19 y.o. G1P0 at 55w0dwho presents to maternity admissions reporting abdominal pain, loose stools and vomiting which all started earlier today.  No bleeding.  Has not tried anything for these symptoms.. She reports good fetal movement, denies LOF, vaginal bleeding, vaginal itching/burning, urinary symptoms, h/a, dizziness, constipation or fever/chills.    Abdominal Pain This is a new problem. The current episode started today. The onset quality is gradual. The problem occurs intermittently. The problem has been unchanged. The pain is located in the periumbilical region and generalized abdominal region. The quality of the pain is cramping and colicky. The abdominal pain radiates to the back. Associated symptoms include diarrhea (not frequent but loose), nausea and vomiting. Pertinent negatives include no anorexia, constipation, dysuria, fever, frequency, headaches or myalgias. Nothing aggravates the pain. The pain is relieved by nothing. She has tried nothing for the symptoms.     RN Note: PT SAYS SHE WENT TO ER - LOWER ABD - STARTED AT 7 PM- . NOW FEELS LESS THAN AT 7 . [facility] WITH  FAMILY [facility]  Past Medical History: Past Medical History:  Diagnosis Date  . Asthma     Past obstetric history: OB History  Gravida Para Term Preterm AB Living  1            SAB TAB Ectopic Multiple Live Births               # Outcome Date GA Lbr Len/2nd Weight Sex Delivery Anes PTL Lv  1 Current             Past Surgical History: Past Surgical History:  Procedure Laterality Date  . NO PAST SURGERIES      Family History: History reviewed. No pertinent family history.  Social History: Social History   Tobacco Use  . Smoking status: Never Smoker  . Smokeless tobacco: Never Used  Vaping Use  . Vaping Use: Never used  Substance Use Topics  . Alcohol use: No  . Drug use: No     Allergies: No Known Allergies  Meds:  Medications Prior to Admission  Medication Sig Dispense Refill Last Dose  . albuterol (VENTOLIN HFA) 108 (90 Base) MCG/ACT inhaler Inhale 2 puffs into the lungs every 6 (six) hours as needed for wheezing or shortness of breath. 18 g 1 02/12/2020 at Unknown time  . docusate sodium (COLACE) 250 MG capsule Take 1 capsule (250 mg total) by mouth 2 (two) times daily as needed for constipation. 30 capsule 4 Past Month at Unknown time  . Prenatal Vit-Fe Fumarate-FA (PRENATAL VITAMIN PO) Take by mouth.   Past Week at Unknown time  . Blood Pressure Monitor MISC For regular home bp monitoring during pregnancy 1 each 0   . loratadine (CLARITIN) 10 MG tablet Take 10 mg by mouth daily.   More than a month at Unknown time    I have reviewed patient's Past Medical Hx, Surgical Hx, Family Hx, Social Hx, medications and allergies.   ROS:  Review of Systems  Constitutional: Negative for fever.  Gastrointestinal: Positive for abdominal pain, diarrhea (not frequent but loose), nausea and vomiting. Negative for anorexia and constipation.  Genitourinary: Negative for dysuria and frequency.  Musculoskeletal: Negative for myalgias.  Neurological: Negative for headaches.   Other systems negative  Physical Exam   Patient Vitals for the past 24 hrs:  BP Temp Temp  src Pulse Resp SpO2 Height Weight  02/13/20 0108 107/60 98.2 F (36.8 C) Oral (!) 113 20 -- 5\' 4"  (1.626 m) 86 kg  02/13/20 0013 112/66 98 F (36.7 C) Oral (!) 114 (!) 22 99 % -- --   Constitutional: Well-developed, well-nourished female in no acute distress.  Cardiovascular: normal rate and rhythm Respiratory: normal effort, clear to auscultation bilaterally GI: Abd soft, non-tender, gravid appropriate for gestational age.   No rebound or guarding. MS: Extremities nontender, no edema, normal ROM Neurologic: Alert and oriented x 4.  GU: Neg CVAT.  PELVIC EXAM: Fetal fibronectin sent Dilation:  Closed Effacement (%): Thick Cervical Position: Posterior Station: Ballotable Exam by:: [phone], CNM   FHT:  Baseline 140 , moderate variability, accelerations present, no decelerations Contractions: uterine irritability   Labs: Results for orders placed or performed during the hospital encounter of 02/13/20 (from the past 24 hour(s))  Urinalysis, Routine w reflex microscopic     Status: Abnormal   Collection Time: 02/13/20  1:45 AM  Result Value Ref Range   Color, Urine AMBER (A) YELLOW   APPearance HAZY (A) CLEAR   Specific Gravity, Urine 1.026 1.005 - 1.030   pH 7.0 5.0 - 8.0   Glucose, UA NEGATIVE NEGATIVE mg/dL   Hgb urine dipstick NEGATIVE NEGATIVE   Bilirubin Urine SMALL (A) NEGATIVE   Ketones, ur 5 (A) NEGATIVE mg/dL   Protein, ur 02/15/20 (A) NEGATIVE mg/dL   Nitrite NEGATIVE NEGATIVE   Leukocytes,Ua SMALL (A) NEGATIVE   RBC / HPF 0-5 0 - 5 RBC/hpf   WBC, UA 21-50 0 - 5 WBC/hpf   Bacteria, UA NONE SEEN NONE SEEN   Squamous Epithelial / LPF 11-20 0 - 5   Mucus PRESENT    Hyaline Casts, UA PRESENT   Fetal fibronectin     Status: None   Collection Time: 02/13/20  1:55 AM  Result Value Ref Range   Fetal Fibronectin NEGATIVE NEGATIVE  CBC     Status: Abnormal   Collection Time: 02/13/20  2:10 AM  Result Value Ref Range   WBC 9.9 4.0 - 10.5 K/uL   RBC 3.83 (L) 3.87 - 5.11 MIL/uL   Hemoglobin 11.1 (L) 12.0 - 15.0 g/dL   HCT 02/15/20 (L) 36 - 46 %   MCV 85.4 80.0 - 100.0 fL   MCH 29.0 26.0 - 34.0 pg   MCHC 33.9 30.0 - 36.0 g/dL   RDW 64.4 03.4 - 74.2 %   Platelets 178 150 - 400 K/uL   nRBC 0.0 0.0 - 0.2 %  Comprehensive metabolic panel     Status: Abnormal   Collection Time: 02/13/20  2:10 AM  Result Value Ref Range   Sodium 137 135 - 145 mmol/L   Potassium 3.2 (L) 3.5 - 5.1 mmol/L   Chloride 102 98 - 111 mmol/L   CO2 22 22 - 32 mmol/L   Glucose, Bld 116 (H) 70 - 99 mg/dL   BUN <5 (L) 6 - 20 mg/dL   Creatinine, Ser 02/15/20 0.44 - 1.00 mg/dL   Calcium 9.1 8.9 -  6.38 mg/dL   Total Protein 6.4 (L) 6.5 - 8.1 g/dL   Albumin 3.1 (L) 3.5 - 5.0 g/dL   AST 17 15 - 41 U/L   ALT 12 0 - 44 U/L   Alkaline Phosphatase 68 38 - 126 U/L   Total Bilirubin 0.6 0.3 - 1.2 mg/dL   GFR, Estimated 75.6 >43 mL/min   Anion gap 13 5 - 15  O/Positive/-- (07/28 1444)  Imaging:  No results found.  MAU Course/MDM: I have ordered labs and reviewed results. No leukocytosis.  LFTs normal  Potassium is mildly depleted, likely from vomiting and loose stools NST reviewed, reactive We gave a liter of IVF which stopped irritability  Treatments in MAU included EFM, IV hydration.    Assessment: Pregnancy at [redacted]w[redacted]d Uterine irritability Loose stools Nausea and vomiting Mild hypokalemia   Plan: Discharge home Rx K-Dur x 3 days Rx Phenergan for nausea Advance diet as tolerated Preterm Labor precautions and fetal kick counts Follow up in Office for prenatal visits   Encouraged to return if she develops worsening of symptoms, increase in pain, fever, or other concerning symptoms.   Pt stable at time of discharge.  Wynelle Bourgeois CNM, MSN Certified Nurse-Midwife 02/13/2020 1:41 AM

## 2020-02-13 NOTE — ED Provider Notes (Signed)
12:33 AM G1P0 female, currently [redacted] weeks pregnant, presenting for abdominal pain.  States that she felt unwell earlier in the day with one episode of vomiting.  Began to have pain periumbilically around 1900 tonight.  Has been experiencing associated urinary urgency as well as discomfort across her back.  No vaginal bleeding, vaginal discharge, fevers, dysuria.  Followed by [name], CNM for prenatal care.  Accepted by midwife, Hilda Lias, at [facility] for evaluation.   Antony Madura, PA-C 02/13/20 0034    Ward, Layla Maw, DO 02/13/20 [id number]

## 2020-02-13 NOTE — ED Triage Notes (Addendum)
Pt said she was having some abdominal pain with nausea tonight. Pt said she did vomit twice. No bleeding, nothing leaking. Pt said she was having pain in her back into her legs. Pt is [redacted] weeks pregnant

## 2020-02-15 ENCOUNTER — Ambulatory Visit (INDEPENDENT_AMBULATORY_CARE_PROVIDER_SITE_OTHER): Payer: Medicaid Other | Admitting: Advanced Practice Midwife

## 2020-02-15 ENCOUNTER — Other Ambulatory Visit: Payer: Self-pay

## 2020-02-15 VITALS — BP 117/66 | HR 89 | Wt 190.0 lb

## 2020-02-15 DIAGNOSIS — Z331 Pregnant state, incidental: Secondary | ICD-10-CM

## 2020-02-15 DIAGNOSIS — Z3A30 30 weeks gestation of pregnancy: Secondary | ICD-10-CM

## 2020-02-15 DIAGNOSIS — Z23 Encounter for immunization: Secondary | ICD-10-CM

## 2020-02-15 DIAGNOSIS — Z1389 Encounter for screening for other disorder: Secondary | ICD-10-CM

## 2020-02-15 DIAGNOSIS — Z3403 Encounter for supervision of normal first pregnancy, third trimester: Secondary | ICD-10-CM

## 2020-02-15 LAB — POCT URINALYSIS DIPSTICK OB
Blood, UA: NEGATIVE
Glucose, UA: NEGATIVE
Ketones, UA: NEGATIVE
Leukocytes, UA: NEGATIVE
Nitrite, UA: NEGATIVE
POC,PROTEIN,UA: NEGATIVE

## 2020-02-15 NOTE — Addendum Note (Signed)
Addended by: Annamarie Dawley on: 02/15/2020 11:10 AM   Modules accepted: Orders

## 2020-02-15 NOTE — Patient Instructions (Signed)
Tercer trimestre de embarazo Third Trimester of Pregnancy El tercer trimestre comprende desde la semana28 hasta la semana40 (desde el mes7 hasta el mes9). El tercer trimestre es un perodo en el que el beb en gestacin (feto) crece rpidamente. Hacia el final del noveno mes, el feto mide alrededor de 20pulgadas (45cm) de largo y pesa entre 6 y 10 libras (2,700 y 4,500kg). Cambios en el cuerpo durante el tercer trimestre Su organismo continuar atravesando por muchos cambios durante el embarazo. Estos cambios varan de una mujer a otra. Durante el tercer trimestre:  Seguir aumentando de peso. Es de esperar que aumente entre 25 y 35libras (11 y 16kg) hacia el final del embarazo.  Podrn aparecer las primeras estras en las caderas, el abdomen y las mamas.  Puede tener necesidad de orinar con ms frecuencia porque el feto baja hacia la pelvis y ejerce presin sobre la vejiga.  Puede desarrollar o continuar teniendo acidez estomacal. Esto se debe a que el aumento de las hormonas hace que los msculos en el tubo digestivo trabajen ms lentamente.  Puede desarrollar o continuar teniendo estreimiento debido a que el aumento de las hormonas ralentiza la digestin y hace que los msculos que empujan los desechos a travs de los intestinos se relajen.  Puede desarrollar hemorroides. Estas son venas hinchadas (venas varicosas) en el recto que pueden causar picazn o dolor.  Puede desarrollar venas hinchadas y abultadas (venas varicosas) en las piernas.  Puede presentar ms dolor en la pelvis, la espalda o los muslos. Esto se debe al aumento de peso y al aumento de las hormonas que relajan las articulaciones.  Tal vez haya cambios en el cabello. Esto cambios pueden incluir su engrosamiento, crecimiento rpido y cambios en la textura. Adems, a algunas mujeres se les cae el cabello durante o despus del embarazo, o tienen el cabello seco o fino. Lo ms probable es que el cabello se le normalice  despus del nacimiento del beb.  Sus pechos seguirn creciendo y se pondrn cada vez ms sensibles. Un lquido amarillo (calostro) puede salir de sus pechos. Esta es la primera leche que usted produce para su beb.  El ombligo puede salir hacia afuera.  Puede observar que se le hinchan las manos, el rostro o los tobillos.  Puede presentar un aumento del hormigueo o entumecimiento en las manos, brazos y piernas. La piel de su vientre tambin puede sentirse entumecida.  Puede sentir que le falta el aire debido a que se expande el tero.  Puede tener ms problemas para dormir. Esto puede deberse al tamao de su vientre, una mayor necesidad de orinar y un aumento en el metabolismo de su cuerpo.  Puede notar que el feto "baja" o lo siente ms bajo, en el abdomen (aligeramiento).  Puede tener un aumento de la secrecin vaginal.  Puede notar que las articulaciones se sienten flojas y puede sentir dolor alrededor del hueso plvico. Qu debe esperar en las visitas prenatales Le harn exmenes prenatales cada 2semanas hasta la semana36. A partir de ese momento le harn los exmenes semanales. Durante una visita prenatal de rutina:  La pesarn para asegurarse de que usted y el beb estn creciendo normalmente.  Le tomarn la presin arterial.  Le medirn el abdomen para controlar el desarrollo del beb.  Se escucharn los latidos cardacos fetales.  Se evaluarn los resultados de los estudios solicitados en visitas anteriores.  Le revisarn el cuello del tero cuando est prxima la fecha de parto para controlar si el cuello uterino   se ha afinado o adelgazado (borrado).  Le harn una prueba de estreptococos del grupo B. Esto sucede entre las semanas 35 y 37. El mdico puede preguntarle lo siguiente:  Cmo le gustara que fuera el parto.  Cmo se siente.  Si siente los movimientos del beb.  Si ha tenido sntomas anormales, como prdida de lquido, sangrado, dolores de cabeza  intensos o clicos abdominales.  Si est consumiendo algn producto que contenga tabaco, como cigarrillos, tabaco de mascar y cigarrillos electrnicos.  Si tiene alguna pregunta. Otros exmenes o estudios de deteccin que pueden realizarse durante el tercer trimestre incluyen lo siguiente:  Anlisis de sangre para controlar los niveles de hierro (anemia).  Controles fetales para determinar su salud, nivel de actividad y crecimiento. Si tiene alguna enfermedad o hay problemas durante el embarazo, le harn estudios.  Prueba sin estrs. Esta prueba verifica la salud de su beb y se utiliza para detectar signos de problemas, tales como si el beb no est recibiendo suficiente oxgeno. Durante esta prueba, se coloca un cinturn alrededor de su vientre. Al moverse el beb, se controla su frecuencia cardaca. Qu es el falso trabajo de parto? El falso trabajo de parto es una afeccin en la que se sienten pequeos e irregulares espasmos de los msculos del tero (contracciones) que generalmente desaparecen al hacer reposo, cambiar de posicin o al beber agua. Estas contracciones se llaman contracciones de Braxton Hicks. Las contracciones pueden durar horas, das o incluso semanas, antes de que el verdadero trabajo de parto se inicie. Si las contracciones ocurren a intervalos regulares, se vuelven ms frecuentes, aumentan en intensidad o se vuelven dolorosas, debera ver al mdico.  Cules son los signos del trabajo de parto?  Clicos abdominales.  Contracciones regulares que comienzan en intervalos de 10 minutos y se vuelven ms fuertes y ms frecuentes con el tiempo.  Contracciones que comienzan en la parte superior del tero y se extienden hacia abajo, a la zona inferior del abdomen y la espalda.  Aumento de la presin en la pelvis y dolor latente en la espalda.  Una secrecin de mucosidad acuosa o con sangre que sale de la vagina.  Prdida de lquido amnitico. Esto tambin se conoce como  "ruptura de la bolsa de las aguas". Esto puede ser un chorro o un goteo constante y lento de lquido. Informe a su mdico si tiene un color u olor extrao. Si tiene alguno de estos signos, llame a su mdico de inmediato, incluso si es antes de la fecha de parto. Siga estas indicaciones en su casa: Medicamentos  Siga las indicaciones del mdico en relacin con el uso de medicamentos. Durante el embarazo, hay medicamentos que pueden tomarse y otros que no.  Tome vitaminas prenatales que contengan por lo menos 600microgramos (?g) de cido flico.  Si est estreida, tome un laxante suave, si el mdico lo autoriza. Qu debe comer y beber   Lleve una dieta equilibrada que incluya gran cantidad de frutas y verduras frescas, cereales integrales, buenas fuentes de protenas como carnes magras, huevos o tofu, y lcteos descremados. El mdico la ayudar a determinar la cantidad de peso que puede aumentar.  No coma carne cruda ni quesos sin cocinar. Estos elementos contienen grmenes que pueden causar defectos congnitos en el beb.  Si no consume muchos alimentos con calcio, hable con su mdico sobre si debera tomar un suplemento diario de calcio.  La ingesta diaria de cuatro o cinco comidas pequeas en lugar de tres comidas abundantes.  Limite el   consumo de alimentos con alto contenido de grasas y azcares procesados, como alimentos fritos o dulces.  Para evitar el estreimiento: ? Bebe suficiente lquido para mantener la orina clara o de color amarillo plido. ? Consuma alimentos ricos en fibra, como frutas y verduras frescas, cereales integrales y frijoles. Actividad  Haga ejercicio solamente como se lo haya indicado el mdico. La mayora de las mujeres pueden continuar su rutina de ejercicios durante el embarazo. Intente realizar como mnimo 30minutos de actividad fsica por lo menos 5das a la semana. Deje de hacer ejercicio si experimenta contracciones uterinas.  Evite levantar pesos  excesivos.  No haga ejercicio en condiciones de calor o humedad extremas, o a grandes alturas.  Use zapatos cmodos de tacn bajo.  Adopte una buena postura.  Puede seguir teniendo relaciones sexuales, excepto que el mdico le diga lo contrario. Alivio del dolor y del malestar  Haga pausas frecuentes y descanse con las piernas elevadas si tiene calambres en las piernas o dolor en la zona lumbar.  Dese baos de asiento con agua tibia para aliviar el dolor o las molestias causadas por las hemorroides. Use una crema para las hemorroides si el mdico la autoriza.  Use un sostn que le brinde buen soporte para prevenir las molestias causadas por la sensibilidad en los pechos.  Si tiene venas varicosas: ? Use pantimedias que brinden soporte o medias de compresin como se lo haya indicado el mdico. ? Eleve los pies durante 15minutos, 3 o 4veces por da. Cuidados prenatales  Escriba sus preguntas. Llvelas cuando concurra a las visitas prenatales.  Concurra a todas las visitas prenatales tal como se lo haya indicado el mdico. Esto es importante. Seguridad  Use el cinturn de seguridad en todo momento mientras conduce.  Haga una lista de los nmeros de telfono de emergencia, que incluya los nmeros de telfono de familiares, amigos, el hospital y los departamentos de polica y bomberos. Instrucciones generales  Evite el contacto con las bandejas sanitarias de los gatos y la tierra que estos animales usan. Estos elementos contienen grmenes que pueden causar defectos congnitos en el beb. Si tiene un gato, pdale a alguien que limpie la caja de arena por usted.  No haga viajes largos excepto que sea absolutamente necesario y solo con la autorizacin de su mdico.  No se d baos de inmersin en agua caliente, baos turcos ni saunas.  No beber alcohol.  No consuma ningn producto que contenga nicotina o tabaco, como cigarrillos y cigarrillos electrnicos. Si necesita ayuda para  dejar de fumar, consulte al mdico.  No use hierbas medicinales ni medicamentos que no le hayan recetado. Estas sustancias qumicas afectan la formacin y el desarrollo del beb.  No se haga duchas vaginales ni use tampones o toallas higinicas perfumadas.  No mantenga las piernas cruzadas durante largos periodos de tiempo.  Para prepararse para la llegada de su beb: ? Tome clases prenatales para entender, practicar, y hacer preguntas sobre el trabajo de parto y el parto. ? Haga un ensayo de la partida al hospital. ? Visite el hospital y recorra el rea de maternidad. ? Pida un permiso de maternidad o paternidad a sus empleadores. ? Organice para que algn familiar o amigo cuide a sus mascotas mientras usted est en el hospital. ? Compre un asiento de seguridad orientado hacia atrs, y asegrese de saber cmo instalarlo en su automvil. ? Prepare el bolso que llevar al hospital. ? Prepare la habitacin del beb. Asegrese de quitar todas las almohadas y animales   de peluche de la cuna del beb para evitar la asfixia.  Visite a su dentista si no lo ha hecho durante el embarazo. Use un cepillo de dientes blando para higienizarse los dientes y psese el hilo dental con suavidad. Comunquese con un mdico si:  No est segura de que est en trabajo de parto o de que ha roto la bolsa de las aguas.  Se siente mareada.  Siente clicos leves, presin en la pelvis o dolor persistente en el abdomen.  Siente dolor en la parte inferior de la espalda.  Tiene nuseas, vmitos o diarrea persistentes.  Observa una secrecin vaginal inusual o con mal olor.  Siente dolor al orinar. Solicite ayuda de inmediato si:  Rompe la bolsa de las aguas antes de la semana 37.  Tiene contracciones regulares en intervalos de menos de 5 minutos antes de la semana 37.  Tiene fiebre.  Tiene una prdida de lquido por la vagina.  Tiene sangrado o pequeas prdidas vaginales.  Tiene dolor o clicos  abdominales intensos.  Baja de peso o sube de peso rpidamente.  Tiene dificultad para respirar y siente dolor de pecho.  Sbitamente se le hinchan mucho el rostro, las manos, los tobillos, los pies o las piernas.  Su beb se mueve menos de 10 veces en 2 horas.  Siente un dolor de cabeza intenso que no se alivia al tomar medicamentos.  Nota cambios en la visin. Resumen  El tercer trimestre comprende desde la semana28 hasta la semana40, es decir, desde el mes7 hasta el mes9. El tercer trimestre es un perodo en el que el beb en gestacin (feto) crece rpidamente.  Durante el tercer trimestre, su incomodidad puede aumentar a medida que usted y su beb continan aumentando de peso. Es posible que tenga dolor abdominal, en las piernas y en la espalda, problemas para dormir y una mayor necesidad de orinar.  Durante el tercer trimestre, sus pechos seguirn creciendo y se pondrn cada vez ms sensibles. Un lquido amarillo (calostro) puede salir de sus pechos. Esta es la primera leche que usted produce para su beb.  El falso trabajo de parto es una afeccin en la que se sienten pequeos e irregulares espasmos de los msculos del tero (contracciones) que a la larga desaparecen. Estas contracciones se llaman contracciones de Braxton Hicks. Las contracciones pueden durar horas, das o incluso semanas, antes de que el verdadero trabajo de parto se inicie.  Los signos del trabajo de parto pueden incluir: calambres abdominales; contracciones regulares que comienzan en intervalos de 10 minutos y se vuelven ms fuertes y ms frecuentes con el tiempo; una secrecin de mucosidad acuosa o con sangre que sale de la vagina; aumento de la presin en la pelvis y dolor latente en la espalda; y prdida de lquido amnitico. Esta informacin no tiene como fin reemplazar el consejo del mdico. Asegrese de hacerle al mdico cualquier pregunta que tenga. Document Revised: 07/17/2016 Document Reviewed:  07/17/2016 Elsevier Patient Education  2020 [employer]  

## 2020-02-15 NOTE — Progress Notes (Signed)
   LOW-RISK PREGNANCY VISIT Patient name: Nichole [name] MRN [id number]  Date of birth: [birth year] Chief Complaint:   Routine Prenatal Visit  History of Present Illness:   Nichole [name] is a 19 y.o. G1P0 female at [redacted]w[redacted]d with an Estimated Date of Delivery: 04/23/20 being seen today for ongoing management of a low-risk pregnancy.  Today she reports occ tinnitis; has noticed occ rash on back of hands after using hand sanitizer. . Contractions: Irritability. Vag. Bleeding: None.  Movement: Present. denies leaking of fluid. Went to [facility] 2 days ago for lower abdominal pain, N/V/D.  cx LTC, UI improved after IVF.  Review of Systems:   Pertinent items are noted in HPI Denies abnormal vaginal discharge w/ itching/odor/irritation, headaches, visual changes, shortness of breath, chest pain, abdominal pain, severe nausea/vomiting, or problems with urination or bowel movements unless otherwise stated above. Pertinent History Reviewed:  Reviewed past medical,surgical, social, obstetrical and family history.  Reviewed problem list, medications and allergies. Physical Assessment:   Vitals:   02/15/20 1020  BP: 117/66  Pulse: 89  Weight: 190 lb (86.2 kg)  Body mass index is 32.61 kg/m.        Physical Examination:   General appearance: Well appearing, and in no distress  Mental status: Alert, oriented to person, place, and time  Skin: Warm & dry  Cardiovascular: Normal heart rate noted  Respiratory: Normal respiratory effort, no distress  Abdomen: Soft, gravid, nontender  Pelvic: Cervical exam deferred         Extremities: Edema: Trace  Fetal Status: Fetal Heart Rate (bpm): 132 Fundal Height: 31 cm Movement: Present    Chaperone: n/a    No results found for this or any previous visit (from the past 24 hour(s)).  Assessment & Plan:  1) Low-risk pregnancy G1P0 at [redacted]w[redacted]d with an Estimated Date of Delivery: 04/23/20     Meds: No orders of the defined types were placed in this  encounter.  Labs/procedures today: Flu vaccine  Plan:  Continue routine obstetrical care  Next visit: prefers in person    Reviewed: Preterm labor symptoms and general obstetric precautions including but not limited to vaginal bleeding, contractions, leaking of fluid and fetal movement were reviewed in detail with the patient.  All questions were answered. Has home bp cuff. . Check bp weekly, let us know if >140/90.   Follow-up: Return in about 2 weeks (around 02/29/2020) for LROB.  Orders Placed This Encounter  Procedures  . Flu Vaccine QUAD 36+ mos IM   Jacklyn Shell DNP, CNM 02/15/2020 10:57 AM

## 2020-02-29 ENCOUNTER — Encounter: Payer: Self-pay | Admitting: Women's Health

## 2020-02-29 ENCOUNTER — Other Ambulatory Visit: Payer: Self-pay

## 2020-02-29 ENCOUNTER — Ambulatory Visit (INDEPENDENT_AMBULATORY_CARE_PROVIDER_SITE_OTHER): Payer: Medicaid Other | Admitting: Women's Health

## 2020-02-29 VITALS — BP 106/67 | HR 97 | Wt 194.0 lb

## 2020-02-29 DIAGNOSIS — Z3A32 32 weeks gestation of pregnancy: Secondary | ICD-10-CM

## 2020-02-29 DIAGNOSIS — Z1389 Encounter for screening for other disorder: Secondary | ICD-10-CM

## 2020-02-29 DIAGNOSIS — Z331 Pregnant state, incidental: Secondary | ICD-10-CM

## 2020-02-29 DIAGNOSIS — Z3403 Encounter for supervision of normal first pregnancy, third trimester: Secondary | ICD-10-CM

## 2020-02-29 LAB — POCT URINALYSIS DIPSTICK OB
Blood, UA: NEGATIVE
Glucose, UA: NEGATIVE
Ketones, UA: NEGATIVE
Leukocytes, UA: NEGATIVE
Nitrite, UA: NEGATIVE
POC,PROTEIN,UA: NEGATIVE

## 2020-02-29 NOTE — Progress Notes (Signed)
LOW-RISK PREGNANCY VISIT Patient name: Nichole [name] MRN [id number]  Date of birth: [birth year] Chief Complaint:   Routine Prenatal Visit  History of Present Illness:   Nichole [name] is a 19 y.o. G1P0 female at [redacted]w[redacted]d with an Estimated Date of Delivery: 04/23/20 being seen today for ongoing management of a low-risk pregnancy.  Depression screen [facility] 2/9 01/15/2020 10/21/2019 08/26/2019 02/26/2019 11/21/2017  Decreased Interest 1 0 0 0 0  Down, Depressed, Hopeless 0 0 0 0 0  PHQ - 2 Score 1 0 0 0 0  Altered sleeping 1 0 - - -  Tired, decreased energy 1 1 - - -  Change in appetite 0 0 - - -  Feeling bad or failure about yourself  0 0 - - -  Trouble concentrating 0 1 - - -  Moving slowly or fidgety/restless 0 0 - - -  Suicidal thoughts 0 0 - - -  PHQ-9 Score 3 2 - - -    Today she reports reflux, mucous, cramping. Contractions: Not present. Vag. Bleeding: None.  Movement: Present. denies leaking of fluid. Review of Systems:   Pertinent items are noted in HPI Denies abnormal vaginal discharge w/ itching/odor/irritation, headaches, visual changes, shortness of breath, chest pain, abdominal pain, severe nausea/vomiting, or problems with urination or bowel movements unless otherwise stated above. Pertinent History Reviewed:  Reviewed past medical,surgical, social, obstetrical and family history.  Reviewed problem list, medications and allergies. Physical Assessment:   Vitals:   02/29/20 1620  BP: 106/67  Pulse: 97  Weight: 194 lb (88 kg)  Body mass index is 33.3 kg/m.        Physical Examination:   General appearance: Well appearing, and in no distress  Mental status: Alert, oriented to person, place, and time  Skin: Warm & dry  Cardiovascular: Normal heart rate noted  Respiratory: Normal respiratory effort, no distress  Abdomen: Soft, gravid, nontender  Pelvic: Cervical exam deferred         Extremities: Edema: Trace  Fetal Status: Fetal Heart Rate (bpm): 134 Fundal Height: 32  cm Movement: Present    Chaperone: N/A   Results for orders placed or performed in visit on 02/29/20 (from the past 24 hour(s))  POC Urinalysis Dipstick OB   Collection Time: 02/29/20  4:23 PM  Result Value Ref Range   Color, UA     Clarity, UA     Glucose, UA Negative Negative   Bilirubin, UA     Ketones, UA neg    Spec Grav, UA     Blood, UA neg    pH, UA     POC,PROTEIN,UA Negative Negative, Trace, Small (1+), Moderate (2+), Large (3+), 4+   Urobilinogen, UA     Nitrite, UA neg    Leukocytes, UA Negative Negative   Appearance     Odor      Assessment & Plan:  1) Low-risk pregnancy G1P0 at [redacted]w[redacted]d with an Estimated Date of Delivery: 04/23/20    Meds: No orders of the defined types were placed in this encounter.  Labs/procedures today: none  Plan:  Continue routine obstetrical care  Next visit: prefers in person    Reviewed: Preterm labor symptoms and general obstetric precautions including but not limited to vaginal bleeding, contractions, leaking of fluid and fetal movement were reviewed in detail with the patient.  All questions were answered. Has home bp cuff. Check bp weekly, let us know if >140/90.   Follow-up: Return in about 2 weeks (around  03/14/2020) for LROB, CNM, in person.  Future Appointments  Date Time Provider Department Center  03/14/2020  4:10 PM Lazaro Arms, MD [facility]    Orders Placed This Encounter  Procedures  . POC Urinalysis Dipstick OB   Cheral Marker CNM, [facility] 02/29/2020 4:36 PM

## 2020-02-29 NOTE — Patient Instructions (Signed)
Nichole [name], I greatly value your feedback.  If you receive a survey following your visit with us today, we appreciate you taking the time to fill it out.  °Thanks, °Kim Elisandra Deshmukh, CNM, WHNP-BC ° °[facility] °([address] [place], [place] [address]) °Entrance C, located off of [address] °Free 24/7 valet parking  ° °Go to [email or web address] to register for FREE online childbirth classes  °  °Call the office ([phone]) or go to [facility] if: °· You begin to have strong, frequent contractions °· Your water breaks.  Sometimes it is a big gush of fluid, sometimes it is just a trickle that keeps getting your panties wet or running down your legs °· You have vaginal bleeding.  It is normal to have a small amount of spotting if your cervix was checked.  °· You don't feel your baby moving like normal.  If you don't, get you something to eat and drink and lay down and focus on feeling your baby move.  You should feel at least 10 movements in 2 hours.  If you don't, you should call the office or go to [facility].  ° °Call the office ([phone]) or go to [facility] for these signs of pre-eclampsia: °· Severe headache that does not go away with Tylenol °· Visual changes- seeing spots, double, blurred vision °· Pain under your right breast or upper abdomen that does not go away with Tums or heartburn medicine °· Nausea and/or vomiting °· Severe swelling in your hands, feet, and face  °  °Home Blood Pressure Monitoring for Patients  ° °Your provider has recommended that you check your blood pressure (BP) at least once a week at home. If you do not have a blood pressure cuff at home, one will be provided for you. Contact your provider if you have not received your monitor within 1 week.  ° °Helpful Tips for Accurate Home Blood Pressure Checks  °• Don't smoke, exercise, or drink caffeine 30 minutes before checking your BP °• Use the restroom before checking your BP (a full  bladder can raise your pressure) °• Relax in a comfortable upright chair °• Feet on the ground °• Left arm resting comfortably on a flat surface at the level of your heart °• Legs uncrossed °• Back supported °• Sit quietly and don't talk °• Place the cuff on your bare arm °• Adjust snuggly, so that only two fingertips can fit between your skin and the top of the cuff °• Check 2 readings separated by at least one minute °• Keep a log of your BP readings °• For a visual, please reference this diagram: http://ccnc.care/bpdiagram ° °Provider Name: [facility]     Phone: [phone] ° °Zone 1: ALL CLEAR  °Continue to monitor your symptoms:  °• BP reading is less than 140 (top number) or less than 90 (bottom number)  °• No right upper stomach pain °• No headaches or seeing spots °• No feeling nauseated or throwing up °• No swelling in face and hands ° °Zone 2: CAUTION °Call your doctor's office for any of the following:  °• BP reading is greater than 140 (top number) or greater than 90 (bottom number)  °• Stomach pain under your ribs in the middle or right side °• Headaches or seeing spots °• Feeling nauseated or throwing up °• Swelling in face and hands ° °Zone 3: EMERGENCY  °Seek immediate medical care if you have any of   the following:  °• BP reading is greater than160 (top number) or greater than 110 (bottom number) °• Severe headaches not improving with Tylenol °• Serious difficulty catching your breath °• Any worsening symptoms from Zone 2  ° °Información sobre parto y trabajo de parto prematuros °(Preterm Labor and Birth Information) °La duración de un embarazo normal es de 39 a 41 semanas. Se llama trabajo de parto prematuro cuando se inicia antes de las 37 semanas de embarazo. °¿CUÁLES SON LOS FACTORES DE RIESGO DEL TRABAJO DE PARTO PREMATURO? °Existen mayores probabilidades de trabajo de parto prematuro en mujeres con las siguientes características: °· Tienen ciertas infecciones durante el embarazo, como  infección de vejiga, infección de transmisión sexual o infección en el útero (corioamnionitis). °· Tienen el cuello del útero más corto que lo normal. °· Tuvieron trabajo de parto prematuro anteriormente. °· Se sometieron a una cirugía en el cuello del útero. °· Son menores de 17 años o mayores de 35 años de edad. °· Son afroamericanas. °· Están embarazadas de mellizos o de varios bebés (gestación múltiple). °· Consumen drogas o fuman mientras están embarazadas. °· No aumentan de peso lo suficiente durante el embarazo. °· Se embarazan poco después de haber estado embarazadas. °¿CUÁLES SON LOS SÍNTOMAS DEL TRABAJO DE PARTO PREMATURO? °Los síntomas del trabajo de parto prematuro incluyen lo siguiente: °· Calambres similares a los que ocurren durante el período menstrual. Los calambres pueden presentarse con diarrea. °· Dolor en el abdomen o en la parte inferior de la espalda. °· Contracciones uterinas regulares que se pueden sentir como una presión en el abdomen. °· Una sensación de mayor presión en la pelvis. °· Aumento de la secreción de moco acuoso o sanguinolento en la vagina. °· Rotura de bolsa (rotura de saco amniótico). °¿POR QUÉ ES IMPORTANTE RECONOCER LOS SIGNOS DEL TRABAJO DE PARTO PREMATURO? °Es importante reconocer los signos del trabajo de parto prematuro porque los bebés que nacen de forma prematura pueden no estar completamente desarrollados. Por lo tanto, pueden correr mayor riesgo de lo siguiente: °· Problemas cardíacos y pulmonares a largo plazo (crónicos). °· Inmediatamente después del parto, dificultades para regular los sistemas corporales, que incluyen glucemia, temperatura corporal, frecuencia cardíaca y frecuencia respiratoria. °· Hemorragia cerebral. °· Parálisis cerebral. °· Dificultades en el aprendizaje. °· Muerte. °Estos riesgos son mucho mayores para bebés que nacen antes de las 34 semanas de embarazo. °¿CÓMO SE TRATA EL TRABAJO DE PARTO PREMATURO? °El tratamiento depende del tiempo de su  embarazo, su afección y la salud de su bebé. Puede incluir lo siguiente: °· Tener un punto (sutura) en el cuello del útero para evitar que este se abra demasiado pronto (cerclaje). °· Tomar medicamentos, por ejemplo: °? Medicamentos hormonales. Estos se pueden administrar de forma temprana en el embarazo para ayudar a mantener el embarazo. °? Medicamentos para detener las contracciones. °? Medicamentos que ayudan a madurar los pulmones del bebé. Estos se pueden recetar si el riesgo de parto es alto. °? Medicamentos para evitar que el bebé desarrolle parálisis cerebral. °Si el trabajo de parto de inicia antes de las 34 semanas de embarazo, es posible que deba hospitalizarse. °¿QUÉ DEBO HACER SI CREO QUE ESTOY EN TRABAJO DE PARTO PREMATURO? °Si cree que está iniciando trabajo de parto prematuro, llame al médico de inmediato. °¿CÓMO PUEDO EVITAR EL TRABAJO DE PARTO PREMATURO EN FUTUROS EMBARAZOS? °Para aumentar las probabilidades de tener un embarazo a término, tenga en cuenta lo siguiente: °· No consuma ningún producto que contenga tabaco, lo que incluye cigarrillos, tabaco de mascar y cigarrillos electrónicos. Si   necesita ayuda para dejar de fumar, consulte al mdico.  No consuma drogas ni medicamentos que no sean recetados Academic librarian.  Hable con el mdico antes de tomar suplementos a base de hierbas aunque los [employer].  Asegrese de llegar a un peso Office manager.  Tenga cuidado con las infecciones. Si cree que puede tener una infeccin, consulte al mdico para que la revisen.  Asegrese de informarle al mdico si ha tenido trabajo de parto prematuro antes. Esta informacin no tiene Theme park manager el consejo del mdico. Asegrese de hacerle al mdico cualquier pregunta que tenga. Document Revised: 11/10/2015 Document Reviewed: 07/27/2015 Elsevier Patient Education  2020 [employer].

## 2020-03-01 ENCOUNTER — Telehealth: Payer: Self-pay | Admitting: Women's Health

## 2020-03-01 NOTE — Telephone Encounter (Signed)
Pt calling concerned with having sore throat, runny nose, SOB, coughing up clear mucus, started last night  Wonders if it's related to getting the flu shot on 02/15/2020  Suggestions?  Please advise & notify pt

## 2020-03-02 ENCOUNTER — Encounter (HOSPITAL_COMMUNITY): Payer: Self-pay | Admitting: Obstetrics and Gynecology

## 2020-03-02 ENCOUNTER — Other Ambulatory Visit: Payer: Self-pay

## 2020-03-02 ENCOUNTER — Inpatient Hospital Stay (HOSPITAL_COMMUNITY)
Admission: AD | Admit: 2020-03-02 | Discharge: 2020-03-02 | Disposition: A | Discharge status: Home / Self Care | Payer: Medicaid Other | Attending: Obstetrics and Gynecology | Admitting: Obstetrics and Gynecology

## 2020-03-02 ENCOUNTER — Inpatient Hospital Stay (HOSPITAL_COMMUNITY): Payer: Medicaid Other

## 2020-03-02 DIAGNOSIS — J101 Influenza due to other identified influenza virus with other respiratory manifestations: Secondary | ICD-10-CM | POA: Diagnosis not present

## 2020-03-02 DIAGNOSIS — J90 Pleural effusion, not elsewhere classified: Secondary | ICD-10-CM | POA: Diagnosis not present

## 2020-03-02 DIAGNOSIS — O99513 Diseases of the respiratory system complicating pregnancy, third trimester: Secondary | ICD-10-CM | POA: Insufficient documentation

## 2020-03-02 DIAGNOSIS — Z3A32 32 weeks gestation of pregnancy: Secondary | ICD-10-CM | POA: Diagnosis not present

## 2020-03-02 DIAGNOSIS — Z3402 Encounter for supervision of normal first pregnancy, second trimester: Secondary | ICD-10-CM

## 2020-03-02 DIAGNOSIS — R0981 Nasal congestion: Secondary | ICD-10-CM | POA: Diagnosis present

## 2020-03-02 DIAGNOSIS — Z20822 Contact with and (suspected) exposure to covid-19: Secondary | ICD-10-CM | POA: Diagnosis not present

## 2020-03-02 DIAGNOSIS — R0602 Shortness of breath: Secondary | ICD-10-CM | POA: Diagnosis not present

## 2020-03-02 DIAGNOSIS — N858 Other specified noninflammatory disorders of uterus: Secondary | ICD-10-CM

## 2020-03-02 LAB — COMPREHENSIVE METABOLIC PANEL
ALT: 13 U/L (ref 0–44)
AST: 18 U/L (ref 15–41)
Albumin: 3 g/dL — ABNORMAL LOW (ref 3.5–5.0)
Alkaline Phosphatase: 86 U/L (ref 38–126)
Anion gap: 10 (ref 5–15)
BUN: <5 mg/dL — ABNORMAL LOW (ref 6–20)
CO2: 23 mmol/L (ref 22–32)
Calcium: 9.1 mg/dL (ref 8.9–10.3)
Chloride: 104 mmol/L (ref 98–111)
Creatinine, Ser: 0.5 mg/dL (ref 0.44–1.00)
GFR, Estimated: >60 mL/min (ref >60)
Glucose, Bld: 92 mg/dL (ref 70–99)
Potassium: 3.6 mmol/L (ref 3.5–5.1)
Sodium: 137 mmol/L (ref 135–145)
Total Bilirubin: 0.4 mg/dL (ref 0.3–1.2)
Total Protein: 6.3 g/dL — ABNORMAL LOW (ref 6.5–8.1)

## 2020-03-02 LAB — CBC
HCT: 31.6 % — ABNORMAL LOW (ref 36.0–46.0)
Hemoglobin: 10.9 g/dL — ABNORMAL LOW (ref 12.0–15.0)
MCH: 29.1 pg (ref 26.0–34.0)
MCHC: 34.5 g/dL (ref 30.0–36.0)
MCV: 84.5 fL (ref 80.0–100.0)
Platelets: 179 10*3/uL (ref 150–400)
RBC: 3.74 MIL/uL — ABNORMAL LOW (ref 3.87–5.11)
RDW: 12.3 % (ref 11.5–15.5)
WBC: 8.8 10*3/uL (ref 4.0–10.5)
nRBC: 0 % (ref 0.0–0.2)

## 2020-03-02 LAB — RESP PANEL BY RT-PCR (FLU A&B, COVID) ARPGX2
Influenza A by PCR: POSITIVE — AB
Influenza B by PCR: NEGATIVE
SARS Coronavirus 2 by RT PCR: NEGATIVE

## 2020-03-02 MED ORDER — LACTATED RINGERS IV BOLUS
1000.0000 mL | Freq: Once | INTRAVENOUS | Status: AC
  Administered 2020-03-02: 07:00:00 1000 mL via INTRAVENOUS

## 2020-03-02 MED ORDER — GUAIFENESIN ER 600 MG PO TB12
600.0000 mg | ORAL_TABLET | Freq: Once | ORAL | Status: AC
  Administered 2020-03-02: 07:00:00 600 mg via ORAL
  Filled 2020-03-02: qty 1

## 2020-03-02 MED ORDER — ACETAMINOPHEN 325 MG PO TABS
650.0000 mg | ORAL_TABLET | Freq: Once | ORAL | Status: AC
  Administered 2020-03-02: 07:00:00 650 mg via ORAL
  Filled 2020-03-02: qty 2

## 2020-03-02 MED ORDER — OSELTAMIVIR PHOSPHATE 75 MG PO CAPS: 75.0000 mg | ORAL_CAPSULE | Freq: Two times a day (BID) | ORAL | 0 refills | Status: DC

## 2020-03-02 MED ORDER — GUAIFENESIN ER 600 MG PO TB12: 600.0000 mg | ORAL_TABLET | Freq: Two times a day (BID) | ORAL | 1 refills | Status: DC

## 2020-03-02 MED ORDER — ACETAMINOPHEN 325 MG PO TABS: 650.0000 mg | ORAL_TABLET | Freq: Four times a day (QID) | ORAL | 1 refills | Status: DC | PRN

## 2020-03-02 NOTE — Discharge Instructions (Signed)
Gripe H1N1 H1N1 Influenza La gripe H1N1 es una enfermedad respiratoria causada por el virus H1N1. Una enfermedad respiratoria afecta las partes del cuerpo que intervienen en la respiracin, como la nariz, la garganta y los pulmones. La gripe H1N1 es una variedad de gripe A. Inicialmente, se la llam gripe porcina porque se pareca a un virus que comnmente afecta a los cerdos. Los sntomas de la gripe H1N1 pueden ser levemente ms graves que los sntomas que causan otros tipos de gripe Astronomer. Financial risk analyst brote epidmico de gripe H1N1 en las personas ocurri en2009. Despus de que el virus se propag de los cerdos a [employer], se transform en un tipo de virus que puede transmitirse entre personas (es contagioso), como otros virus que causan gripe. El virus no se contrae al comer cerdo. Cules son las causas? La gripe H1N1 es causada por el virus H1N1. Se puede contagiar el virus de las siguientes Loda:  Al aspirar las gotitas que una persona infectada elimina al toser o estornudar (secreciones respiratorias).  Al tocar algo que estuvo expuesto al virus (fue contaminado) y luego tocarse la boca, la nariz o los ojos. Qu incrementa el riesgo? Las Transport planner un riesgo ms alto de contagiarse la gripe H1N1 si:  Son [place] de 65aos de edad.  Son menores de [place].  Viven en una residencia de ancianos o en un hospital.  Son adultos jvenes y siguen un tratamiento con aspirina.  Tienen el sistema que combate las enfermedades (sistema inmunitario) debilitado.  Est embarazada.  Tienen una enfermedad a largo plazo (crnica). Cules son los signos o los sntomas? Los signos y sntomas pueden comenzar entre 3y 5das despus de contraer la infeccin. Estos pueden incluir los siguientes:  Teacher, English as a foreign language.  Escalofros.  Dolores musculares (mialgia).  Cansancio.  Prdida del apetito.  Tos.  Secrecin nasal.  Dolor de garganta.  Dolor de  [place].  Nuseas.  Vmitos o diarrea. (Estos sntomas son menos frecuentes). Cmo se diagnostica? Esta afeccin se puede diagnosticar en funcin de los sntomas, la historia clnica y los antecedentes mdicos. En algunos casos, se analiza un hisopado de lquido de la nariz o la garganta para determinar si est presente el virus H1N1. Cmo se trata? La gripe H1N1 habitualmente desaparece en un perodo de 4a 8das sin tratamiento. Cuidarse en el hogar puede ser lo nico necesario durante ese perodo. Su mdico puede recomendarle usar medicamentos de venta libre, beber una gran cantidad de lquidos y agregar humedad en el aire en su casa. Si corre riesgo de [employer] gripe grave, posiblemente reciba tratamiento con un medicamento antiviral. Si ese medicamento se toma con suficiente prontitud, puede acortar la duracin de la enfermedad y Lexicographer grave. Siga estas indicaciones en su casa: Medicamentos  [employer] de venta libre y los recetados solamente como se lo haya indicado el mdico.  Si le recetaron medicamentos antivirales, tmelos como se lo haya indicado el mdico. No deje de tomarlo aunque comience a sentirse mejor.  No le administre aspirina a un nio con gripe por el riesgo de que contraiga el sndrome de [place]. Actividad  Descanse en su casa mientras se recupera.  Evite contagiar la gripe H1N1 a Economist. Siga estos pasos hasta que haya dejado de tener fiebre durante 24 horas sin el uso de medicamentos: ? Personal assistant de su casa. Lanny Hurst en su casa y no concurra al Aleen Campi ni a la escuela. ? Mantngase alejado de las [address], las personas [address] [place], los nios  pequeos y las personas con afecciones crnicas. Instrucciones generales      Use un humidificador de aire fro para agregar humedad al aire de su casa. Esto puede ayudarlo a Solicitor.  Al toser o estornudar, cbrase la boca y la [place].  No prepare alimentos para otras personas  si est infectado.  Lvese las manos con agua y jabn frecuentemente, en especial despus de toser o Engineering geologist. Use desinfectante para manos si no dispone de [place] y [place].  Asegrese de que todas las personas que viven en su casa se laven bien las manos y con frecuencia.  Bebe suficiente lquido para mantener la orina de color amarillo plido.  Concurra a todas las visitas de control como se lo haya indicado el mdico. Esto es importante. Cmo se evita?  Aplicarse la vacuna anual contra la gripe es la mejor manera de prevenir la gripe. Puede aplicarse la vacuna contra la gripe a fines de verano, en otoo o en invierno. Pregntele al mdico cundo debe aplicarse la vacuna contra la gripe.  Lvese las manos con agua y jabn frecuentemente, en especial despus de toser o Engineering geologist. Use desinfectante para manos si no dispone de [place] y [place].  Evite el contacto con personas que estn enfermas durante la temporada de resfro y gripe. Use una mascarilla para protegerse cuando se encuentre entre personas que estn enfermas o podran estar enfermas.  Siga una dieta saludable, beba muchos lquidos, duerma lo suficiente y realice actividad fsica con regularidad.  No se toque la cara si no se limpi las manos. Comunquese con un mdico si:  Presenta nuevos sntomas.  Tiene sntomas graves que no mejoran con el cuidado Facilities manager.  Tiene sntomas de gripe: ? Que duran ms de [employer]. ? Que regresan. Solicite ayuda de inmediato si:  Tiene dificultad para respirar.  Siente dolor en el pecho.  Tiene un catarro amarillo o grisceo o con [place].  Se siente mareado o se desmaya.  Se siente confundido.  Le falta energa (est aletargado). Resumen  La gripe H1N1 es una enfermedad respiratoria causada por el virus H1N1. Los sntomas pueden ser levemente ms graves que los sntomas que causan otros tipos de gripe Astronomer.  La gripe H1N1 habitualmente desaparece en un perodo de 4a  8das sin tratamiento. Cuidarse en el hogar puede ser lo nico necesario durante ese perodo.  Qudese en su casa y no concurra al Aleen Campi ni a la escuela hasta que haya dejado de tener fiebre durante 24 horas sin el uso de [place].  Aplicarse la vacuna anual contra la gripe y Education administrator una buena higiene de las manos puede ayudar a Teacher, early years/pre. Esta informacin no tiene Theme park manager el consejo del mdico. Asegrese de hacerle al mdico cualquier pregunta que tenga. Document Revised: 05/31/2017 Document Reviewed: 10/30/2016 Elsevier Patient Education  2020 Elsevier Inc. Influenza, Adult Influenza is also called "the flu." It is an infection in the lungs, nose, and throat (respiratory tract). It is caused by a virus. The flu causes symptoms that are similar to symptoms of a cold. It also causes a high fever and body aches. The flu spreads easily from person to person (is contagious). Getting a flu shot (influenza vaccination) every year is the best way to prevent the flu. What are the causes? This condition is caused by the influenza virus. You can get the virus by:  Breathing in droplets that are in the air from the cough or sneeze of a person who has  the virus.  Touching something that has the virus on it (is contaminated) and then touching your mouth, nose, or eyes. What increases the risk? Certain things may make you more likely to get the flu. These include:  Not washing your hands often.  Having close contact with many people during cold and flu season.  Touching your mouth, eyes, or nose without first washing your hands.  Not getting a flu shot every year. You may have a higher risk for the flu, along with serious problems such as a lung infection (pneumonia), if you:  Are older than 65.  Are pregnant.  Have a weakened disease-fighting system (immune system) because of a disease or taking certain medicines.  Have a long-term (chronic) illness, such  as: ? Heart, kidney, or lung disease. ? Diabetes. ? Asthma.  Have a liver disorder.  Are very overweight (morbidly obese).  Have anemia. This is a condition that affects your red blood cells. What are the signs or symptoms? Symptoms usually begin suddenly and last 4-14 days. They may include:  Fever and chills.  Headaches, body aches, or muscle aches.  Sore throat.  Cough.  Runny or stuffy (congested) nose.  Chest discomfort.  Not wanting to eat as much as normal (poor appetite).  Weakness or feeling tired (fatigue).  Dizziness.  Feeling sick to your stomach (nauseous) or throwing up (vomiting). How is this treated? If the flu is found early, you can be treated with medicine that can help reduce how bad the illness is and how long it lasts (antiviral medicine). This may be given by mouth (orally) or through an IV tube. Taking care of yourself at home can help your symptoms get better. Your doctor may suggest:  Taking over-the-counter medicines.  Drinking plenty of fluids. The flu often goes away on its own. If you have very bad symptoms or other problems, you may be treated in a hospital. Follow these instructions at home:     Activity  Rest as needed. Get plenty of sleep.  Stay home from work or school as told by your doctor. ? Do not leave home until you do not have a fever for 24 hours without taking medicine. ? Leave home only to visit your doctor. Eating and drinking  Take an ORS (oral rehydration solution). This is a drink that is sold at pharmacies and stores.  Drink enough fluid to keep your pee (urine) pale yellow.  Drink clear fluids in small amounts as you are able. Clear fluids include: ? Water. ? Ice chips. ? Fruit juice that has water added (diluted fruit juice). ? Low-calorie sports drinks.  Eat bland, easy-to-digest foods in small amounts as you are able. These foods include: ? Bananas. ? Applesauce. ? Rice. ? Lean  meats. ? Toast. ? Crackers.  Do not eat or drink: ? Fluids that have a lot of sugar or caffeine. ? Alcohol. ? Spicy or fatty foods. General instructions  Take over-the-counter and prescription medicines only as told by your doctor.  Use a cool mist humidifier to add moisture to the air in your home. This can make it easier for you to breathe.  Cover your mouth and nose when you cough or sneeze.  Wash your hands with soap and water often, especially after you cough or sneeze. If you cannot use soap and water, use alcohol-based hand sanitizer.  Keep all follow-up visits as told by your doctor. This is important. How is this prevented?   Get a flu shot  every year. You may get the flu shot in late summer, fall, or winter. Ask your doctor when you should get your flu shot.  Avoid contact with people who are sick during fall and winter (cold and flu season). Contact a doctor if:  You get new symptoms.  You have: ? Chest pain. ? Watery poop (diarrhea). ? A fever.  Your cough gets worse.  You start to have more mucus.  You feel sick to your stomach.  You throw up. Get help right away if you:  Have shortness of breath.  Have trouble breathing.  Have skin or nails that turn a bluish color.  Have very bad pain or stiffness in your neck.  Get a sudden headache.  Get sudden pain in your face or ear.  Cannot eat or drink without throwing up. Summary  Influenza ("the flu") is an infection in the lungs, nose, and throat. It is caused by a virus.  Take over-the-counter and prescription medicines only as told by your doctor.  Getting a flu shot every year is the best way to avoid getting the flu. This information is not intended to replace advice given to you by your health care provider. Make sure you discuss any questions you have with your health care provider. Document Revised: 08/21/2017 Document Reviewed: 08/21/2017 Elsevier Patient Education  2020 [employer]  [employer]

## 2020-03-02 NOTE — MAU Note (Addendum)
.  Nichole [name] is a 19 y.o. at [redacted]w[redacted]d here in MAU reporting: "I've been feeling sick since last night. I started having a runny nose, coughing, but I didn't have a sore throat yet. Just itchy. My backs of my legs hurt as well and my whole body was hurting earlier. I also kept getting cold then hot." She reports she used her inhaler last night thinking it would help and reports it did in the moment but then she became congested again. She reports "I can breathe just fine I just have to breathe through my mouth because my nose is stopped up. My ears are also clogged up and itchy." She reports when she woke up this morning and got up, she noticed her vision was blurry so she sat down and it cleared up.   Husband, husbands father and husbands sister have been sick and all live in the same house.  +FM. No VB. No LOF.  Had COVID last Spring in May.

## 2020-03-02 NOTE — MAU Provider Note (Signed)
Chief Complaint:  No chief complaint on file.   Event Date/Time   First Provider Initiated Contact with Patient 03/02/20 0554     HPI: Nichole [name] is a 19 y.o. G1P0 at 17w4dwho presents to maternity admissions reporting scratchy throat, nasal congestion, leg and body aches, several family members have been sick also.  She had COVID in January 2021.Marland Kitchen She reports good fetal movement, denies LOF, vaginal bleeding, vaginal itching/burning, urinary symptoms, h/a, dizziness, n/v, diarrhea, constipation or fever/chills. .  Fever  This is a new problem. The current episode started yesterday. The problem occurs intermittently. The problem has been waxing and waning. Her temperature was unmeasured prior to arrival. Associated symptoms include congestion, coughing, muscle aches and a sore throat. Pertinent negatives include no abdominal pain, diarrhea, headaches, nausea, vomiting or wheezing. She has tried nothing for the symptoms.  Risk factors: sick contacts     RN Note: I've been feeling sick since last night. I started having a runny nose, coughing, but I didn't have a sore throat yet. Just itchy. My backs of my legs hurt as well and my whole body was hurting earlier. I also kept getting cold then hot." She reports she used her inhaler last night thinking it would help and reports it did in the moment but then she became congested again. She reports "I can breathe just fine I just have to breathe through my mouth because my nose is stopped up. My ears are also clogged up and itchy." She reports when she woke up this morning and got up, she noticed her vision was blurry so she sat down and it cleared up.   Husband, husbands father and husbands sister have been sick and all live in the same house.  Past Medical History: Past Medical History:  Diagnosis Date  . Asthma     Past obstetric history: OB History  Gravida Para Term Preterm AB Living  1            SAB IAB Ectopic Multiple Live Births                # Outcome Date GA Lbr Len/2nd Weight Sex Delivery Anes PTL Lv  1 Current             Past Surgical History: Past Surgical History:  Procedure Laterality Date  . NO PAST SURGERIES      Family History: No family history on file.  Social History: Social History   Tobacco Use  . Smoking status: Never Smoker  . Smokeless tobacco: Never Used  Vaping Use  . Vaping Use: Never used  Substance Use Topics  . Alcohol use: No  . Drug use: No    Allergies: No Known Allergies  Meds:  Medications Prior to Admission  Medication Sig Dispense Refill Last Dose  . albuterol (VENTOLIN HFA) 108 (90 Base) MCG/ACT inhaler Inhale 2 puffs into the lungs every 6 (six) hours as needed for wheezing or shortness of breath. (Patient not taking: Reported on 02/29/2020) 18 g 1   . Blood Pressure Monitor MISC For regular home bp monitoring during pregnancy (Patient not taking: Reported on 02/29/2020) 1 each 0   . docusate sodium (COLACE) 250 MG capsule Take 1 capsule (250 mg total) by mouth 2 (two) times daily as needed for constipation. (Patient not taking: Reported on 02/29/2020) 30 capsule 4   . loratadine (CLARITIN) 10 MG tablet Take 10 mg by mouth daily.     . Prenatal Vit-Fe Fumarate-FA (PRENATAL VITAMIN  PO) Take by mouth.     . promethazine (PHENERGAN) 25 MG tablet Take 1 tablet (25 mg total) by mouth every 6 (six) hours as needed for nausea or vomiting. (Patient not taking: Reported on 02/29/2020) 30 tablet 2     I have reviewed patient's Past Medical Hx, Surgical Hx, Family Hx, Social Hx, medications and allergies.   ROS:  Review of Systems  Constitutional: Positive for fever.  HENT: Positive for congestion and sore throat.   Respiratory: Positive for cough. Negative for wheezing.   Gastrointestinal: Negative for abdominal pain, diarrhea, nausea and vomiting.  Neurological: Negative for headaches.   Other systems negative  Physical Exam   Patient Vitals for the past 24  hrs:  BP Temp Temp src Pulse Resp SpO2  03/02/20 0547 96/71 98.8 F (37.1 C) Oral (!) 144 17 99 %   Constitutional: Well-developed, well-nourished female in no acute distress.  Cardiovascular: normal rate and rhythm Respiratory: normal effort, clear to auscultation bilaterally GI: Abd soft, non-tender, gravid appropriate for gestational age.   No rebound or guarding. MS: Extremities nontender, no edema, normal ROM Neurologic: Alert and oriented x 4.  GU: Neg CVAT.  PELVIC EXAM: deferred  FHT:  Baseline 150 , moderate variability, accelerations present, no decelerations Contractions: q 4-6 mins Irregular     Labs: Results for orders placed or performed during the hospital encounter of 03/02/20 (from the past 24 hour(s))  Resp Panel by RT-PCR (Flu A&B, Covid) Nasopharyngeal Swab     Status: Abnormal   Collection Time: 03/02/20  5:43 AM   Specimen: Nasopharyngeal Swab; Nasopharyngeal(NP) swabs in vial transport medium  Result Value Ref Range   SARS Coronavirus 2 by RT PCR NEGATIVE NEGATIVE   Influenza A by PCR POSITIVE (A) NEGATIVE   Influenza B by PCR NEGATIVE NEGATIVE  CBC     Status: Abnormal   Collection Time: 03/02/20  6:08 AM  Result Value Ref Range   WBC 8.8 4.0 - 10.5 K/uL   RBC 3.74 (L) 3.87 - 5.11 MIL/uL   Hemoglobin 10.9 (L) 12.0 - 15.0 g/dL   HCT 16.1 (L) 09.6 - 04.5 %   MCV 84.5 80.0 - 100.0 fL   MCH 29.1 26.0 - 34.0 pg   MCHC 34.5 30.0 - 36.0 g/dL   RDW 40.9 81.1 - 91.4 %   Platelets 179 150 - 400 K/uL   nRBC 0.0 0.0 - 0.2 %  Comprehensive metabolic panel     Status: Abnormal   Collection Time: 03/02/20  6:08 AM  Result Value Ref Range   Sodium 137 135 - 145 mmol/L   Potassium 3.6 3.5 - 5.1 mmol/L   Chloride 104 98 - 111 mmol/L   CO2 23 22 - 32 mmol/L   Glucose, Bld 92 70 - 99 mg/dL   BUN <5 (L) 6 - 20 mg/dL   Creatinine, Ser 7.82 0.44 - 1.00 mg/dL   Calcium 9.1 8.9 - 95.6 mg/dL   Total Protein 6.3 (L) 6.5 - 8.1 g/dL   Albumin 3.0 (L) 3.5 - 5.0 g/dL    AST 18 15 - 41 U/L   ALT 13 0 - 44 U/L   Alkaline Phosphatase 86 38 - 126 U/L   Total Bilirubin 0.4 0.3 - 1.2 mg/dL   GFR, Estimated >21 >30 mL/min   Anion gap 10 5 - 15    O/Positive/-- (07/28 1444) EKG showed sinus tachycardia  Imaging:  DG Chest Portable 1 View  Result Date: 03/02/2020 CLINICAL DATA:  19 year old  female with shortness of breath. EXAM: PORTABLE CHEST 1 VIEW COMPARISON:  Chest radiographs 08/01/2016 and earlier. FINDINGS: Portable AP upright view at 0627 hours. Lung volumes and mediastinal contours remain normal. Visualized tracheal air column is within normal limits. Allowing for portable technique the lungs are clear. No pneumothorax or pleural effusion. No osseous abnormality identified. Paucity of bowel gas in the upper abdomen. IMPRESSION: Negative portable chest. Electronically Signed   By: Odessa Fleming M.D.   On: 03/02/2020 07:05     MAU Course/MDM: I have ordered labs and reviewed results. Influenza A is positive.  K+ is improved and normal now.  CXR is clear.  NST reviewed, reassuring, but baseline in 150s, likely due to fever IV fluids given for contractions and tachycardia. One liter brought maternal HR from 140 to 120.  Contractions stopped   Mucinex and Tylenol given here Long explanation to pt and FOB re: diagnosis and home care  Assessment: Single IUP at [redacted]w[redacted]d Influenza A  Plan: Discharge home Rx Mucinex for cough Rx Tamiflu sent for flu Rx Tylenol for fever Preterm Labor precautions and fetal kick counts Follow up in Office for prenatal visits   Encouraged to return if she develops worsening of symptoms, increase in pain, fever, or other concerning symptoms.  Pt stable at time of discharge.  Wynelle Bourgeois CNM, MSN Certified Nurse-Midwife 03/02/2020 5:54 AM

## 2020-03-14 ENCOUNTER — Other Ambulatory Visit: Payer: Self-pay

## 2020-03-14 ENCOUNTER — Encounter: Payer: Self-pay | Admitting: Women's Health

## 2020-03-14 ENCOUNTER — Ambulatory Visit (INDEPENDENT_AMBULATORY_CARE_PROVIDER_SITE_OTHER): Payer: Medicaid Other | Admitting: Women's Health

## 2020-03-14 VITALS — BP 114/70 | HR 97 | Wt 193.6 lb

## 2020-03-14 DIAGNOSIS — Z1389 Encounter for screening for other disorder: Secondary | ICD-10-CM

## 2020-03-14 DIAGNOSIS — Z3A34 34 weeks gestation of pregnancy: Secondary | ICD-10-CM

## 2020-03-14 DIAGNOSIS — Z331 Pregnant state, incidental: Secondary | ICD-10-CM

## 2020-03-14 DIAGNOSIS — Z3483 Encounter for supervision of other normal pregnancy, third trimester: Secondary | ICD-10-CM

## 2020-03-14 LAB — POCT URINALYSIS DIPSTICK OB
Blood, UA: NEGATIVE
Glucose, UA: NEGATIVE
Ketones, UA: NEGATIVE
Leukocytes, UA: NEGATIVE
Nitrite, UA: NEGATIVE
POC,PROTEIN,UA: NEGATIVE

## 2020-03-14 NOTE — Progress Notes (Signed)
LOW-RISK PREGNANCY VISIT Patient name: Nichole [name] MRN [id number]  Date of birth: 2000/10/11 Chief Complaint:   Routine Prenatal Visit  History of Present Illness:   Nichole [name] is a 19 y.o. G1P0 female at [redacted]w[redacted]d with an Estimated Date of Delivery: 04/23/20 being seen today for ongoing management of a low-risk pregnancy.  Depression screen [facility] 2/9 01/15/2020 10/21/2019 08/26/2019 02/26/2019 11/21/2017  Decreased Interest 1 0 0 0 0  Down, Depressed, Hopeless 0 0 0 0 0  PHQ - 2 Score 1 0 0 0 0  Altered sleeping 1 0 - - -  Tired, decreased energy 1 1 - - -  Change in appetite 0 0 - - -  Feeling bad or failure about yourself  0 0 - - -  Trouble concentrating 0 1 - - -  Moving slowly or fidgety/restless 0 0 - - -  Suicidal thoughts 0 0 - - -  PHQ-9 Score 3 2 - - -    Today she reports low back pain when up on feet a lot. Contractions: Irritability. Vag. Bleeding: None.  Movement: Present. denies leaking of fluid. Review of Systems:   Pertinent items are noted in HPI Denies abnormal vaginal discharge w/ itching/odor/irritation, headaches, visual changes, shortness of breath, chest pain, abdominal pain, severe nausea/vomiting, or problems with urination or bowel movements unless otherwise stated above. Pertinent History Reviewed:  Reviewed past medical,surgical, social, obstetrical and family history.  Reviewed problem list, medications and allergies. Physical Assessment:   Vitals:   03/14/20 1612  BP: 114/70  Pulse: 97  Weight: 193 lb 9.6 oz (87.8 kg)  Body mass index is 33.23 kg/m.        Physical Examination:   General appearance: Well appearing, and in no distress  Mental status: Alert, oriented to person, place, and time  Skin: Warm & dry  Cardiovascular: Normal heart rate noted  Respiratory: Normal respiratory effort, no distress  Abdomen: Soft, gravid, nontender  Pelvic: Cervical exam deferred         Extremities: Edema: None  Fetal Status: Fetal Heart Rate (bpm):  130 Fundal Height: 34 cm Movement: Present    Chaperone: N/A   Results for orders placed or performed in visit on 03/14/20 (from the past 24 hour(s))  POC Urinalysis Dipstick OB   Collection Time: 03/14/20  4:11 PM  Result Value Ref Range   Color, UA     Clarity, UA     Glucose, UA Negative Negative   Bilirubin, UA     Ketones, UA neg    Spec Grav, UA     Blood, UA neg    pH, UA     POC,PROTEIN,UA Negative Negative, Trace, Small (1+), Moderate (2+), Large (3+), 4+   Urobilinogen, UA     Nitrite, UA neg    Leukocytes, UA Negative Negative   Appearance     Odor      Assessment & Plan:  1) Low-risk pregnancy G1P0 at [redacted]w[redacted]d with an Estimated Date of Delivery: 04/23/20    Meds: No orders of the defined types were placed in this encounter.  Labs/procedures today: none  Plan:  Continue routine obstetrical care  Next visit: prefers will be in person for gbs    Reviewed: Preterm labor symptoms and general obstetric precautions including but not limited to vaginal bleeding, contractions, leaking of fluid and fetal movement were reviewed in detail with the patient.  All questions were answered. Has home bp cuff. Check bp weekly, let us know  if >140/90.   Follow-up: Return in about 2 weeks (around 03/28/2020) for LROB, in person, CNM.  No future appointments.  Orders Placed This Encounter  Procedures  . POC Urinalysis Dipstick OB   Cheral Marker CNM, [facility] 03/14/2020 4:38 PM

## 2020-03-14 NOTE — Patient Instructions (Signed)
Nichole [name], I greatly value your feedback.  If you receive a survey following your visit with [name] today, we appreciate you taking the time to fill it out.  Thanks, [name], CNM, WHNP-BC  [facility] ([address] [place], [place] [address]) Entrance C, located off of [address] valet parking   Go to [email or web address] to register for FREE online childbirth classes    Call the office [phone]) or go to [facility] if:  You begin to have strong, frequent contractions  Your water breaks.  Sometimes it is a big gush of fluid, sometimes it is just a trickle that keeps getting your panties wet or running down your legs  You have vaginal bleeding.  It is normal to have a small amount of spotting if your cervix was checked.   You don't feel your baby moving like normal.  If you don't, get you something to eat and drink and lay down and focus on feeling your baby move.  You should feel at least 10 movements in 2 hours.  If you don't, you should call the office or go to [facility].   Call the office [phone]) or go to [facility] hospital for these signs of pre-eclampsia:  Severe headache that does not go away with Tylenol  Visual changes- seeing spots, double, blurred vision  Pain under your right breast or upper abdomen that does not go away with Tums or heartburn medicine  Nausea and/or vomiting  Severe swelling in your hands, feet, and face    Home Blood Pressure Monitoring for Patients   Your provider has recommended that you check your blood pressure (BP) at least once a week at home. If you do not have a blood pressure cuff at home, one will be provided for you. Contact your provider if you have not received your monitor within 1 week.   Helpful Tips for Accurate Home Blood Pressure Checks   Don't smoke, exercise, or drink caffeine 30 minutes before checking your BP  Use the restroom before checking your BP (a full  bladder can raise your pressure)  Relax in a comfortable upright chair  Feet on the ground  Left arm resting comfortably on a flat surface at the level of your heart  Legs uncrossed  Back supported  Sit quietly and don't talk  Place the cuff on your bare arm  Adjust snuggly, so that only two fingertips can fit between your skin and the top of the cuff  Check 2 readings separated by at least one minute  Keep a log of your BP readings  For a visual, please reference this diagram: http://ccnc.care/bpdiagram  Provider Name: [facility]     Phone: [phone]  Zone 1: ALL CLEAR  Continue to monitor your symptoms:   BP reading is less than 140 (top number) or less than 90 (bottom number)   No right upper stomach pain  No headaches or seeing spots  No feeling nauseated or throwing up  No swelling in face and hands  Zone 2: CAUTION Call your doctor's office for any of the following:   BP reading is greater than 140 (top number) or greater than 90 (bottom number)   Stomach pain under your ribs in the middle or right side  Headaches or seeing spots  Feeling nauseated or throwing up  Swelling in face and hands  Zone 3: EMERGENCY  Seek immediate medical care if you have any of  the following:   BP reading is greater than160 (top number) or greater than 110 (bottom number)  Severe headaches not improving with Tylenol  Serious difficulty catching your breath  Any worsening symptoms from Zone 2   Informacin sobre parto y [place] de parto prematuros (Preterm Labor and Birth Information) La duracin de un embarazo normal es de 39 a 41semanas. Se llama trabajo de parto prematuro cuando se inicia antes de las 37semanas de [place]. CULES SON LOS FACTORES DE RIESGO DEL TRABAJO DE PARTO PREMATURO? Existen mayores probabilidades de trabajo de parto prematuro en mujeres con las siguientes caractersticas:  Tienen ciertas infecciones durante el embarazo, como  infeccin de vejiga, infeccin de transmisin sexual o infeccin en el tero (corioamnionitis).  Tienen el cuello del tero ms corto que lo normal.  Tuvieron trabajo de parto prematuro anteriormente.  Se sometieron a una ciruga en el cuello del tero.  Son menores de 17aos o [address] de 35aos de edad.  Son afroamericanas.  Estn embarazadas de [employer] o de varios bebs (gestacin mltiple).  Consumen drogas o fuman mientras estn embarazadas.  No aumentan de peso lo suficiente durante el [employer].  Se embarazan poco despus de [employer]. CULES SON LOS SNTOMAS DEL TRABAJO DE PARTO PREMATURO? Los sntomas del trabajo de parto prematuro incluyen lo siguiente:  Educational psychologist similares a los que ocurren durante el perodo menstrual. Los calambres pueden presentarse con diarrea.  Dolor en el abdomen o en la parte inferior de la espalda.  Contracciones uterinas regulares que se pueden sentir como una presin en el abdomen.  Una sensacin de mayor presin en la pelvis.  Aumento de la secrecin de moco acuoso o sanguinolento en la vagina.  Rotura de bolsa (rotura de saco amnitico). POR QU ES IMPORTANTE RECONOCER LOS SIGNOS DEL TRABAJO DE PARTO PREMATURO? Es Public librarian los signos del trabajo de parto prematuro porque los bebs que nacen de forma prematura pueden no estar completamente desarrollados. Por lo tanto, pueden correr mayor riesgo de lo siguiente:  Problemas cardacos y pulmonares a Air cabin crew (crnicos).  Inmediatamente despus del parto, dificultades para regular los sistemas corporales, que incluyen glucemia, temperatura corporal, frecuencia cardaca y frecuencia respiratoria.  Hemorragia cerebral.  Parlisis cerebral.  Dificultades en el aprendizaje.  Muerte. Estos riesgos son [employer] para bebs que nacen antes de las 34semanas de [place]. CMO SE TRATA EL TRABAJO DE PARTO PREMATURO? El tratamiento depende del tiempo de su  [place], su afeccin y la salud de su beb. Puede incluir lo siguiente:  Tener un punto (sutura) en el cuello del tero para evitar que este se abra demasiado pronto (cerclaje).  Tomar medicamentos, por ejemplo: ? Medicamentos hormonales. Estos se pueden administrar de forma temprana en el embarazo para ayudar a Visual merchandiser. ? Medicamentos para TEFL teacher las contracciones. ? Medicamentos que ayudan a [employer] del beb. Estos se pueden recetar si el riesgo de parto es [place]. ? Medicamentos para evitar que el beb desarrolle parlisis cerebral. Si el trabajo de parto de inicia antes de las 34semanas de [place], es posible que deba hospitalizarse. QU DEBO HACER SI CREO QUE ESTOY EN TRABAJO DE PARTO PREMATURO? Si cree que est iniciando trabajo de parto prematuro, llame al mdico de inmediato. CMO PUEDO EVITAR EL TRABAJO DE PARTO PREMATURO EN FUTUROS EMBARAZOS? Para aumentar las probabilidades de tener un embarazo a trmino, Financial planner en cuenta lo siguiente:  No consuma ningn producto que contenga tabaco, lo que incluye cigarrillos, tabaco de Theatre manager y Administrator, Civil Service. Si  necesita ayuda para dejar de fumar, consulte al mdico.  No consuma drogas ni medicamentos que no sean recetados Academic librarian.  Hable con el mdico antes de tomar suplementos a base de hierbas aunque los [employer].  Asegrese de llegar a un peso Office manager.  Tenga cuidado con las infecciones. Si cree que puede tener una infeccin, consulte al mdico para que la revisen.  Asegrese de informarle al mdico si ha tenido trabajo de parto prematuro antes. Esta informacin no tiene Theme park manager el consejo del mdico. Asegrese de hacerle al mdico cualquier pregunta que tenga. Document Revised: 11/10/2015 Document Reviewed: 07/27/2015 Elsevier Patient Education  2020 [employer].

## 2020-03-18 ENCOUNTER — Encounter (HOSPITAL_COMMUNITY): Payer: Self-pay | Admitting: Family Medicine

## 2020-03-18 ENCOUNTER — Other Ambulatory Visit: Payer: Self-pay

## 2020-03-18 ENCOUNTER — Inpatient Hospital Stay (HOSPITAL_COMMUNITY)
Admission: AD | Admit: 2020-03-18 | Discharge: 2020-03-18 | Disposition: A | Discharge status: Home / Self Care | Payer: Medicaid Other | Attending: Family Medicine | Admitting: Family Medicine

## 2020-03-18 DIAGNOSIS — Z3A36 36 weeks gestation of pregnancy: Secondary | ICD-10-CM | POA: Insufficient documentation

## 2020-03-18 DIAGNOSIS — O4700 False labor before 37 completed weeks of gestation, unspecified trimester: Secondary | ICD-10-CM

## 2020-03-18 DIAGNOSIS — Z3A00 Weeks of gestation of pregnancy not specified: Secondary | ICD-10-CM

## 2020-03-18 DIAGNOSIS — R109 Unspecified abdominal pain: Secondary | ICD-10-CM | POA: Diagnosis present

## 2020-03-18 DIAGNOSIS — O4703 False labor before 37 completed weeks of gestation, third trimester: Secondary | ICD-10-CM

## 2020-03-18 LAB — URINALYSIS, ROUTINE W REFLEX MICROSCOPIC
Bilirubin Urine: NEGATIVE
Glucose, UA: NEGATIVE mg/dL
Ketones, ur: NEGATIVE mg/dL
Nitrite: NEGATIVE
Protein, ur: NEGATIVE mg/dL
Specific Gravity, Urine: 1.005 (ref 1.005–1.030)
pH: 7 (ref 5.0–8.0)

## 2020-03-18 LAB — WET PREP, GENITAL
Clue Cells Wet Prep HPF POC: NONE SEEN
Sperm: NONE SEEN
Trich, Wet Prep: NONE SEEN
Yeast Wet Prep HPF POC: NONE SEEN

## 2020-03-18 MED ORDER — CYCLOBENZAPRINE HCL 5 MG PO TABS
10.0000 mg | ORAL_TABLET | Freq: Once | ORAL | Status: AC
  Administered 2020-03-18: 10 mg via ORAL
  Filled 2020-03-18: qty 2

## 2020-03-18 MED ORDER — LACTATED RINGERS IV BOLUS
1000.0000 mL | Freq: Once | INTRAVENOUS | Status: AC
  Administered 2020-03-18: 1000 mL via INTRAVENOUS

## 2020-03-18 MED ORDER — LACTATED RINGERS IV SOLN
Freq: Once | INTRAVENOUS | Status: DC
Start: 2020-03-18 — End: 2020-03-18

## 2020-03-18 NOTE — Discharge Instructions (Signed)
Keep your appointments in the office. Drink at least 8 8-oz glasses of water every day.    Contracciones de [employer] Braxton Continental Airlines Las contracciones del tero pueden presentarse durante todo el [place], [place] no siempre indican que la mujer est de [place]. Es posible que usted haya tenido contracciones de prctica llamadas "contracciones de [place]". A veces, se las confunde con el parto real. Qu son las contracciones de [place]? Las contracciones de [place] son espasmos que se producen en los msculos del tero antes del [place]. A diferencia de las contracciones del parto verdadero, estas no producen el agrandamiento (la dilatacin) ni el afinamiento del cuello uterino. Hacia el final del embarazo [facility] las semanas [phone]), las contracciones de Braxton Hicks pueden presentarse ms seguido y tornarse ms intensas. A veces, resulta difcil distinguirlas del parto verdadero porque pueden ser [employer]. No debe sentirse avergonzada si concurre al hospital con falso parto. En ocasiones, la nica forma de saber si el trabajo de parto es verdadero es que el mdico determine si hay cambios en el cuello del tero. El Office Depot har un examen fsico y International aid/development worker controle las contracciones. Si usted no est de Systems developer, el examen debe indicar que el cuello uterino no est dilatado y que usted no ha roto Barista. Si no hay otros problemas de salud asociados con su embarazo, no habr inconvenientes si la envan a su casa con un falso parto. Es posible que las contracciones de Braxton Hicks continen hasta que se desencadene el parto verdadero. Cmo diferenciar el Aleen Campi de parto falso del verdadero Trabajo de parto verdadero  Las contracciones duran de [place].  Las contracciones pueden tornarse muy regulares.  La molestia generalmente se siente en la parte superior del tero y se extiende hacia la zona baja del abdomen y Parker Hannifin cintura.  Las  contracciones no desaparecen cuando usted camina.  Las contracciones generalmente se hacen ms intensas y Comptroller.  El cuello uterino se dilata y se afina. Parto falso  En general, las contracciones son ms cortas y no tan intensas como las del parto verdadero.  En general, las contracciones son irregulares.  A menudo, las contracciones se sienten en la parte delantera de la parte baja del abdomen y en la ingle.  Las Futures trader cuando usted camina o [place] de posicin mientras est [place].  Las contracciones se vuelven ms dbiles y su duracin es menor a medida que transcurre Allied Waste Industries.  En general, el cuello uterino no se dilata ni se afina. Siga estas indicaciones en su casa:   Tome los medicamentos de venta libre y los recetados solamente como se lo haya indicado el mdico.  Contine haciendo los ejercicios habituales y siga las dems indicaciones que el mdico le d.  Coma y beba con moderacin si cree que est de parto.  Si las contracciones de Dole Food provocan incomodidad: ? Cambie de posicin: si est acostada o descansando, camine; si est caminando, descanse. ? Sintese y descanse en una baera con agua tibia. ? Beba suficiente lquido como para mantener la orina de color amarillo plido. La deshidratacin puede provocar contracciones. ? Respire lenta y profundamente varias veces por hora.  Vaya a todas las visitas de control prenatales y de control como se lo haya indicado el mdico. Esto es importante. Comunquese con un mdico si:  Tiene fiebre.  Siente dolor constante en el abdomen. Solicite ayuda de inmediato si:  Las contracciones se intensifican,  se hacen ms regulares y [employer] s.  Tiene una prdida de lquido por la vagina.  Elimina una mucosidad sanguinolenta (prdida del tapn mucoso).  Tiene una hemorragia vaginal.  Tiene un dolor en la zona lumbar que nunca tuvo antes.  Siente que la  cabeza del beb empuja hacia abajo y ejerce presin en la zona plvica.  El beb no se mueve tanto como antes. Resumen  Las State Farm se presentan antes del parto se conocen como contracciones de [place], [place] parto o contracciones de Multimedia programmer.  En general, las contracciones de [address] son ms cortas, ms dbiles, con ms tiempo entre una y [place], y menos regulares que las contracciones del parto verdadero. Las contracciones del parto verdadero se intensifican progresivamente y se tornan regulares y ms frecuentes.  Para controlar la [employer] producen las contracciones de [place], puede cambiar de posicin, darse un bao templado y Lawyer, beber mucha agua o practicar la respiracin profunda. Esta informacin no tiene Theme park manager el consejo del mdico. Asegrese de hacerle al mdico cualquier pregunta que tenga. Document Revised: 06/14/2017 Document Reviewed: 10/15/2016 Elsevier Patient Education  2020 [employer].

## 2020-03-18 NOTE — MAU Provider Note (Signed)
History     CSN: [id number]  Arrival date and time: 03/18/20 1102   Event Date/Time   First Provider Initiated Contact with Patient 03/18/20 1153     CC: vaginal bleeding and abdominal pain  HPI Has had abdominal pain for 2 days - constant on her right side and on the left side of the umbilicus.  Had intercourse last night and had bleeding when she wiped this morning.  Continued to have pain and came for evaluation.  In the car ride to MAU over 30 minutes she was experiencing numbness in her legs but she now has no numbness in her legs.     OB History    Gravida  1   Para      Term      Preterm      AB      Living        SAB      IAB      Ectopic      Multiple      Live Births              Past Medical History:  Diagnosis Date  . Asthma     Past Surgical History:  Procedure Laterality Date  . NO PAST SURGERIES      No family history on file.  Social History   Tobacco Use  . Smoking status: Never Smoker  . Smokeless tobacco: Never Used  Vaping Use  . Vaping Use: Never used  Substance Use Topics  . Alcohol use: No  . Drug use: No    Allergies: No Known Allergies  Medications Prior to Admission  Medication Sig Dispense Refill Last Dose  . acetaminophen (TYLENOL) 325 MG tablet Take 2 tablets (650 mg total) by mouth every 6 (six) hours as needed for mild pain or fever. 30 tablet 1 Past Week at Unknown time  . albuterol (VENTOLIN HFA) 108 (90 Base) MCG/ACT inhaler Inhale 2 puffs into the lungs every 6 (six) hours as needed for wheezing or shortness of breath. 18 g 1 03/17/2020 at Unknown time  . Blood Pressure Monitor MISC For regular home bp monitoring during pregnancy 1 each 0 Past Week at Unknown time  . docusate sodium (COLACE) 250 MG capsule Take 1 capsule (250 mg total) by mouth 2 (two) times daily as needed for constipation. 30 capsule 4 Past Week at Unknown time  . Prenatal Vit-Fe Fumarate-FA (PRENATAL VITAMIN PO) Take by mouth.    03/18/2020 at Unknown time  . promethazine (PHENERGAN) 25 MG tablet Take 1 tablet (25 mg total) by mouth every 6 (six) hours as needed for nausea or vomiting. (Patient not taking: No sig reported) 30 tablet 2     Review of Systems  Constitutional: Negative for fever.  Respiratory: Negative for cough and shortness of breath.   Gastrointestinal: Positive for abdominal pain. Negative for nausea and vomiting.  Genitourinary: Positive for vaginal bleeding. Negative for dysuria and vaginal discharge.   Physical Exam   Blood pressure 112/71, pulse (!) 104, temperature 98.7 F (37.1 C), resp. rate 17, weight 87.2 kg, last menstrual period 07/18/2019, SpO2 100 %.  Physical Exam Vitals and nursing note reviewed.  Constitutional:      Appearance: She is well-developed and well-nourished.  HENT:     Head: Normocephalic.  Eyes:     Extraocular Movements: EOM normal.  Abdominal:     Palpations: Abdomen is soft.     Tenderness: There is no abdominal tenderness. There is  no guarding or rebound.     Comments: FHT baseline 130 with moderate variability and 15x15 accels noted. Contractions irregular and uterine irritability noted.  No decelerations.  Category 1 strip.  Genitourinary:    Comments: Speculum exam: vulva - clear and no blood on pad on bed Vagina - Small amount of red blood and mucus discharge, no odor Cervix - No active bleeding Bimanual exam: Cervix internal os closed GC/Chlam, wet prep done Chaperone present for exam.  Musculoskeletal:        General: Normal range of motion.     Cervical back: Neck supple.  Skin:    General: Skin is warm and dry.  Neurological:     Mental Status: She is alert and oriented to person, place, and time.  Psychiatric:        Mood and Affect: Mood and affect normal.     MAU Course  Procedures LABS Results for orders placed or performed during the hospital encounter of 03/18/20 (from the past 24 hour(s))  Urinalysis, Routine w reflex  microscopic Urine, Clean Catch     Status: Abnormal   Collection Time: 03/18/20 11:53 AM  Result Value Ref Range   Color, Urine YELLOW YELLOW   APPearance CLEAR CLEAR   Specific Gravity, Urine 1.005 1.005 - 1.030   pH 7.0 5.0 - 8.0   Glucose, UA NEGATIVE NEGATIVE mg/dL   Hgb urine dipstick LARGE (A) NEGATIVE   Bilirubin Urine NEGATIVE NEGATIVE   Ketones, ur NEGATIVE NEGATIVE mg/dL   Protein, ur NEGATIVE NEGATIVE mg/dL   Nitrite NEGATIVE NEGATIVE   Leukocytes,Ua TRACE (A) NEGATIVE   RBC / HPF 0-5 0 - 5 RBC/hpf   WBC, UA 0-5 0 - 5 WBC/hpf   Bacteria, UA RARE (A) NONE SEEN   Squamous Epithelial / LPF 0-5 0 - 5  Wet prep, genital     Status: Abnormal   Collection Time: 03/18/20 12:51 PM   Specimen: PATH Cytology Cervicovaginal Ancillary Only  Result Value Ref Range   Yeast Wet Prep HPF POC NONE SEEN NONE SEEN   Trich, Wet Prep NONE SEEN NONE SEEN   Clue Cells Wet Prep HPF POC NONE SEEN NONE SEEN   WBC, Wet Prep HPF POC MANY (A) NONE SEEN   Sperm NONE SEEN      MDM Had IVF 1000 cc LR and Flexeril 10mg  PO.  Client feeling much better after fluids  Cervix closed.  Assessment and Plan  Threatened preterm labor Category 1 strip  Plan Keep your appointment in the office 03-30-20 Call the office if your pain is worsening. No sex until 36 weeks. Expect the bleeding to resolve.  Habiba Treloar L Akeelah Seppala 03/18/2020, 12:58 PM

## 2020-03-18 NOTE — MAU Note (Signed)
Patient started having constant lower abdominal pain 2 days ago.  Had intercourse last night and then woke up this morning with bleeding and left lower abdominal pain and what felt like "air bubbles" moving down at the lower part of her abdomen.  Also reports feeling numbness in her legs.  Also reports decreased fetal movement until she arrived here, now feeling baby move.

## 2020-03-19 NOTE — L&D Delivery Note (Addendum)
OB/GYN Faculty Practice Delivery Note  Nichole [name] is a 20 y.o. G1P0 s/p SVD at [redacted]w[redacted]d. She was admitted for labor.   ROM: 1h 44m with clear fluid GBS Status: positive  Maximum Maternal Temperature: 98.7  Labor Progress: Progressed well with expectant management   Delivery Date/Time: 04/26/20. 0700 Delivery: Called to room and patient was complete and pushing. Head delivered LOA. Loose nuchal x2, which was reduced after delivery.  Compound hand present. Shoulder and body delivered in usual fashion. Infant with spontaneous cry, placed on mother's abdomen, dried and stimulated. Cord clamped x 2 after 1-minute delay, and cut by FOB under my direct supervision. Cord blood drawn. Placenta delivered spontaneously with gentle cord traction. Fundus slow to firm despite vigorous bimanual massage and Pitocin bolus. Immediately lost a large amount of blood, so TXA 1000mg  given IV bolus and cytotec 800 mcg given PR.  Within a few minutes, bleeding slowed and fundus became and remained firm. Labia, perineum, vagina, and cervix were inspected, without any lacerations   Placenta: complete, three vessel cord appreciated Complications: post partum hemorrhage  Lacerations: none  EBL: 1950 mL Analgesia: Fentanyl IV   Infant:  APGARs 9, 9  weight pending   , DO [facility], [facility]     The above was performed under my direct supervision and guidance.

## 2020-03-21 LAB — GC/CHLAMYDIA PROBE AMP (~~LOC~~) NOT AT ARMC
Chlamydia: NEGATIVE
Comment: NEGATIVE
Comment: NORMAL
Neisseria Gonorrhea: NEGATIVE

## 2020-03-30 ENCOUNTER — Other Ambulatory Visit (HOSPITAL_COMMUNITY)
Admission: RE | Admit: 2020-03-30 | Discharge: 2020-03-30 | Disposition: A | Discharge status: Home / Self Care | Payer: Medicaid Other | Source: Ambulatory Visit | Attending: Advanced Practice Midwife | Admitting: Advanced Practice Midwife

## 2020-03-30 ENCOUNTER — Other Ambulatory Visit: Payer: Self-pay

## 2020-03-30 ENCOUNTER — Encounter: Payer: Self-pay | Admitting: Advanced Practice Midwife

## 2020-03-30 ENCOUNTER — Ambulatory Visit (INDEPENDENT_AMBULATORY_CARE_PROVIDER_SITE_OTHER): Payer: Medicaid Other | Admitting: Advanced Practice Midwife

## 2020-03-30 VITALS — BP 105/65 | HR 88 | Wt 197.0 lb

## 2020-03-30 DIAGNOSIS — Z3403 Encounter for supervision of normal first pregnancy, third trimester: Secondary | ICD-10-CM | POA: Diagnosis not present

## 2020-03-30 DIAGNOSIS — Z3A36 36 weeks gestation of pregnancy: Secondary | ICD-10-CM | POA: Insufficient documentation

## 2020-03-30 DIAGNOSIS — Z331 Pregnant state, incidental: Secondary | ICD-10-CM

## 2020-03-30 DIAGNOSIS — Z1389 Encounter for screening for other disorder: Secondary | ICD-10-CM

## 2020-03-30 LAB — POCT URINALYSIS DIPSTICK OB
Glucose, UA: NEGATIVE
Ketones, UA: NEGATIVE
Nitrite, UA: NEGATIVE
POC,PROTEIN,UA: NEGATIVE

## 2020-03-30 NOTE — Progress Notes (Signed)
   LOW-RISK PREGNANCY VISIT Patient name: Nichole [name] MRN [id number]  Date of birth: [birth year] Chief Complaint:   Routine Prenatal Visit (GBS, GC/CHL)  History of Present Illness:   Nichole [name] is a 20 y.o. G1P0 female at [redacted]w[redacted]d with an Estimated Date of Delivery: 04/23/20 being seen today for ongoing management of a low-risk pregnancy.  Today she reports mostly doing well; some low back pain; leg sometimes 'gives out'. Contractions: Irregular. Vag. Bleeding: None.  Movement: Present. denies leaking of fluid. Review of Systems:   Pertinent items are noted in HPI Denies abnormal vaginal discharge w/ itching/odor/irritation, headaches, visual changes, shortness of breath, chest pain, abdominal pain, severe nausea/vomiting, or problems with urination or bowel movements unless otherwise stated above. Pertinent History Reviewed:  Reviewed past medical,surgical, social, obstetrical and family history.  Reviewed problem list, medications and allergies. Physical Assessment:   Vitals:   03/30/20 1635  BP: 105/65  Pulse: 88  Weight: 197 lb (89.4 kg)  Body mass index is 33.81 kg/m.        Physical Examination:   General appearance: Well appearing, and in no distress  Mental status: Alert, oriented to person, place, and time  Skin: Warm & dry  Cardiovascular: Normal heart rate noted  Respiratory: Normal respiratory effort, no distress  Abdomen: Soft, gravid, nontender  Pelvic: Cervical exam performed  Dilation: 1 Effacement (%): Thick Station: -2  Extremities: Edema: Trace  Fetal Status: Fetal Heart Rate (bpm): 131 Fundal Height: 36 cm Movement: Present Presentation: Vertex  Results for orders placed or performed in visit on 03/30/20 (from the past 24 hour(s))  POC Urinalysis Dipstick OB   Collection Time: 03/30/20  4:36 PM  Result Value Ref Range   Color, UA     Clarity, UA     Glucose, UA Negative Negative   Bilirubin, UA     Ketones, UA neg    Spec Grav, UA     Blood,  UA trace    pH, UA     POC,PROTEIN,UA Negative Negative, Trace, Small (1+), Moderate (2+), Large (3+), 4+   Urobilinogen, UA     Nitrite, UA neg    Leukocytes, UA Trace (A) Negative   Appearance     Odor      Assessment & Plan:  1) Low-risk pregnancy G1P0 at [redacted]w[redacted]d with an Estimated Date of Delivery: 04/23/20   2) Sciatic discomfort, rev'd   Meds: No orders of the defined types were placed in this encounter.  Labs/procedures today: GBS/cultures/SVE  Plan:  Continue routine obstetrical care   Reviewed: Term labor symptoms and general obstetric precautions including but not limited to vaginal bleeding, contractions, leaking of fluid and fetal movement were reviewed in detail with the patient.  All questions were answered. Didn't ask about home bp cuff. Check bp weekly, let us know if >140/90.   Follow-up: Return in about 1 week (around 04/06/2020) for LROB, in person.  Orders Placed This Encounter  Procedures  . Strep Gp B NAA  . POC Urinalysis Dipstick OB   Nichole [name] [facility] 03/30/2020 5:01 PM

## 2020-03-30 NOTE — Patient Instructions (Addendum)
Rosen's Emergency Medicine: Concepts and Clinical Practice (9th ed., pp. 2296- 2312). Elsevier.">  Contracciones de [employer]xton [employer] Las contracciones del tero pueden presentarse durante todo el [place], [place] no siempre indican que la mujer est de [place]. Es posible que usted haya tenido contracciones de prctica llamadas "contracciones de [place]". A veces, se las confunde con el parto real. Qu son las contracciones de [place]? Las contracciones de [place] son espasmos que se producen en los msculos del tero antes del [place]. A diferencia de las contracciones del parto verdadero, estas no producen el agrandamiento (la dilatacin) ni el afinamiento del cuello uterino. Hacia el final del embarazo [facility] las semanas [phone]), las contracciones de Braxton Hicks pueden presentarse ms seguido y tornarse ms intensas. A veces, resulta difcil distinguirlas del parto verdadero porque pueden ser [employer]. No debe sentirse avergonzada si concurre al hospital con falso parto. En ocasiones, la nica forma de saber si el trabajo de parto es verdadero es que el mdico determine si hay cambios en el cuello del tero. El [employer] har un examen fsico y Armed forces operational officer controle las contracciones. Si usted no est de Network engineer, el examen debe indicar que el cuello uterino no est dilatado y que usted no ha roto Financial trader. Si no hay otros problemas de salud asociados con su embarazo, no habr inconvenientes si la envan a su casa con un falso parto. Es posible que las contracciones de Braxton Hicks continen hasta que se desencadene el parto verdadero. Cmo diferenciar el [place] de parto falso del verdadero [place]ajo de parto verdadero  Las contracciones [place].  Las contracciones pueden tornarse muy regulares.  La molestia generalmente se siente en la parte superior del tero y se extiende hacia la zona baja del abdomen y [employer] cintura.  Las  contracciones no desaparecen cuando usted camina.  Las contracciones generalmente se hacen ms intensas y Copywriter, advertising.  El cuello uterino se dilata y se afina. Parto falso  En general, las contracciones son ms cortas y no tan intensas como las del parto verdadero.  En general, las contracciones son irregulares.  A menudo, las contracciones se sienten en la parte delantera de la parte baja del abdomen y en la ingle.  Las Physiological scientist cuando usted camina o [place] de posicin mientras est [place].  Las contracciones se vuelven ms dbiles y su duracin es menor a medida que transcurre [employer].  En general, el cuello uterino no se dilata ni se afina. Siga estas indicaciones en su casa:  Tome los medicamentos de venta libre y los recetados solamente como se lo haya indicado el mdico.  Contine haciendo los ejercicios habituales y siga las dems indicaciones que el mdico le d.  Coma y beba con moderacin si cree que est de parto.  Si las contracciones de [employer] provocan incomodidad: ? Cambie de posicin: si est acostada o descansando, camine; si est caminando, descanse. ? Sintese y descanse en una baera con agua tibia. ? Beba suficiente lquido como para mantener la orina de color amarillo plido. La deshidratacin puede provocar contracciones. ? Respire lenta y profundamente varias veces por hora.  Vaya a todas las visitas de control prenatales y de control como se lo haya indicado el mdico. Esto es importante.   Comunquese con un mdico si:  Tiene fiebre.  Siente dolor constante en el abdomen. Solicite ayuda de inmediato si:  Las contracciones se intensifican, se hacen ms regulares  y cercanas entre sí. °· Tiene una pérdida de líquido por la vagina. °· Elimina una mucosidad sanguinolenta (pérdida del tapón mucoso). °· Tiene una hemorragia vaginal. °· Tiene un dolor en la zona lumbar que nunca tuvo antes. °· Siente que la  cabeza del bebé empuja hacia abajo y ejerce presión en la zona pélvica. °· El bebé no se mueve tanto como antes. °Resumen °· Las contracciones que se presentan antes del parto se conocen como contracciones de Braxton Hicks, falso parto o contracciones de práctica. °· En general, las contracciones de Braxton Hicks son más cortas, más débiles, con más tiempo entre una y otra, y menos regulares que las contracciones del parto verdadero. Las contracciones del parto verdadero se intensifican progresivamente y se tornan regulares y más frecuentes. °· Para controlar la molestia que producen las contracciones de Braxton Hicks, puede cambiar de posición, darse un baño templado y descansar, beber mucha agua o practicar la respiración profunda. °Esta información no tiene como fin reemplazar el consejo del médico. Asegúrese de hacerle al médico cualquier pregunta que tenga. °Document Revised: 06/14/2017 Document Reviewed: 10/15/2016 °Elsevier Patient Education © 2021 Elsevier Inc. ° °

## 2020-03-31 ENCOUNTER — Other Ambulatory Visit: Payer: Self-pay

## 2020-03-31 ENCOUNTER — Inpatient Hospital Stay (HOSPITAL_COMMUNITY)
Admission: AD | Admit: 2020-03-31 | Discharge: 2020-03-31 | Disposition: A | Discharge status: Home / Self Care | Payer: Medicaid Other | Attending: Obstetrics and Gynecology | Admitting: Obstetrics and Gynecology

## 2020-03-31 ENCOUNTER — Encounter (HOSPITAL_COMMUNITY): Payer: Self-pay | Admitting: Obstetrics and Gynecology

## 2020-03-31 DIAGNOSIS — Z3A36 36 weeks gestation of pregnancy: Secondary | ICD-10-CM | POA: Diagnosis not present

## 2020-03-31 DIAGNOSIS — Z3689 Encounter for other specified antenatal screening: Secondary | ICD-10-CM

## 2020-03-31 DIAGNOSIS — Z0371 Encounter for suspected problem with amniotic cavity and membrane ruled out: Secondary | ICD-10-CM | POA: Diagnosis not present

## 2020-03-31 DIAGNOSIS — Z3403 Encounter for supervision of normal first pregnancy, third trimester: Secondary | ICD-10-CM | POA: Diagnosis not present

## 2020-03-31 LAB — POCT FERN TEST: POCT Fern Test: NEGATIVE

## 2020-03-31 NOTE — Discharge Instructions (Signed)

## 2020-03-31 NOTE — MAU Note (Signed)
Pt woke up around 6 and she was wet clear fluid. Still having some leaking now. Had some strong ctx around that time. Reports increased pressure and back pain not sure if they are ctx. Good fetal movement reported.

## 2020-03-31 NOTE — MAU Provider Note (Addendum)
S: [name] is a 20 y.o. G1P0 at [redacted]w[redacted]d who presents to MAU today complaining of leaking of fluid in her bed at 4:20 this morning but has not had LOF since that time. She denies vaginal bleeding. She denies contractions. She reports normal fetal movement.    O: BP 108/73 (BP Location: Right Arm)   Pulse (!) 103   Temp 98.1 F (36.7 C) (Oral)   Resp 18   Ht 5\' 4"  (1.626 m)   Wt 88.5 kg   LMP 07/18/2019   SpO2 100%   BMI 33.47 kg/m  GENERAL: Well-developed, well-nourished female in no acute distress.  HEAD: Normocephalic, atraumatic.  CHEST: Normal effort of breathing, regular heart rate ABDOMEN: Soft, nontender, gravid PELVIC: Normal external female genitalia. Vagina is pink and rugated. Cervix with normal contour, no lesions. Normal discharge. No leakage of amniotic fluid into the vagina.  Cervical exam:  Dilation: Closed Effacement (%): 50 Station: Ballotable Presentation: Vertex Exam by:: [email or web address], CNM   Fetal Monitoring:  Baseline: 135 bpm Variability: moderate Accelerations: present Decelerations: none Contractions: none  Results for orders placed or performed during the hospital encounter of 03/31/20 (from the past 24 hour(s))  POCT fern test     Status: None   Collection Time: 03/31/20 11:43 AM  Result Value Ref Range   POCT Fern Test Negative = intact amniotic membranes    A: SIUP at [redacted]w[redacted]d  Membranes intact  Reactive Non-Stress Test [redacted] weeks gestation of pregnancy  P: - Discharge home in stable condition - Next f/u visit with [facility] 1/19 - Discussed post cervical exam discomfort and expectations - Instructed on return precautions - Instructed to return if needed or if her condition worsens  2/19, Medical Student 03/31/2020 11:44 AM   Attestation of Supervision of Student:  I confirm that I have verified the information documented in the medical student's note and that I have also personally reperformed the history, physical  exam and all medical decision making activities.  I have verified that all services and findings are accurately documented in this student's note; and I agree with management and plan as outlined in the documentation. I have also made any necessary editorial changes.  Fern reviewed by provider and negative. Patient informed of findings. Instructed to follow up as needed or return if other issues arise.   04/02/2020, CNM [facility], [facility] 03/31/2020 2:08 PM

## 2020-04-01 LAB — CERVICOVAGINAL ANCILLARY ONLY
Chlamydia: NEGATIVE
Comment: NEGATIVE
Comment: NORMAL
Neisseria Gonorrhea: NEGATIVE

## 2020-04-02 LAB — STREP GP B NAA: Strep Gp B NAA: POSITIVE — AB

## 2020-04-03 ENCOUNTER — Encounter: Payer: Self-pay | Admitting: Advanced Practice Midwife

## 2020-04-03 DIAGNOSIS — B951 Streptococcus, group B, as the cause of diseases classified elsewhere: Secondary | ICD-10-CM | POA: Insufficient documentation

## 2020-04-06 ENCOUNTER — Encounter: Payer: Self-pay | Admitting: Obstetrics and Gynecology

## 2020-04-06 ENCOUNTER — Other Ambulatory Visit: Payer: Self-pay

## 2020-04-06 ENCOUNTER — Ambulatory Visit (INDEPENDENT_AMBULATORY_CARE_PROVIDER_SITE_OTHER): Payer: Medicaid Other | Admitting: Obstetrics and Gynecology

## 2020-04-06 VITALS — BP 107/71 | HR 94 | Wt 197.8 lb

## 2020-04-06 DIAGNOSIS — Z3403 Encounter for supervision of normal first pregnancy, third trimester: Secondary | ICD-10-CM

## 2020-04-06 DIAGNOSIS — Z141 Cystic fibrosis carrier: Secondary | ICD-10-CM

## 2020-04-06 DIAGNOSIS — B951 Streptococcus, group B, as the cause of diseases classified elsewhere: Secondary | ICD-10-CM

## 2020-04-06 NOTE — Patient Instructions (Addendum)
Parto vaginal Vaginal Delivery  Parto vaginal significa que usted da a luz empujando al beb fuera del canal del parto (vagina). Un equipo de proveedores de atencin mdica la ayudar antes, durante y despus del parto vaginal. Las experiencias de los nacimientos son nicas para todas las mujeres, y cada embarazo y las experiencias de nacimiento varan segn dnde elija dar a luz. Qu ocurrir cuando llegue al centro de parto o al hospital? Una vez que se inicie el trabajo de parto y haya sido admitida en el hospital o centro de parto, el mdico podr hacer lo siguiente:  Revisar sus antecedentes de embarazo y cualquier inquietud que usted pueda tener.  Colocarle una va intravenosa en una de las venas. Esto se podr usar para administrarle lquidos y medicamentos.  Verificar su presin arterial, pulso, temperatura y frecuencia cardaca (signos vitales).  Verificar si la bolsa de agua (saco amnitico) se ha roto (ruptura).  Hablar con usted sobre su plan de nacimiento y analizar las opciones para controlar el dolor. Monitoreo Su mdico puede monitorear las contracciones (monitoreo uterino) y la frecuencia cardaca del beb (monitoreo fetal). Es posible que el monitoreo se necesite realizar:  Con frecuencia, pero no continuamente (intermitentemente).  Todo el tiempo o durante largos perodos a la vez (continuamente). El monitoreo continuo puede ser necesario si: ? Est recibiendo determinados medicamentos, tales como medicamentos para aliviar el dolor o para hacer que las contracciones sean ms fuertes. ? Tiene complicaciones durante el embarazo o el trabajo de parto. El monitoreo se puede realizar:  Al colocar un estetoscopio especial o un dispositivo manual de monitoreo en el abdomen o verificar los latidos cardacos del beb y comprobar las contracciones.  Al colocar monitores en el abdomen (monitores externos) para registrar los latidos cardacos del beb y la frecuencia y duracin de  las contracciones.  Al colocar monitores dentro del tero a travs de la vagina (monitores internos) para registrar los latidos cardacos del beb y la frecuencia, duracin y fuerza de sus contracciones. Segn el tipo de monitor, puede permanecer en el tero o en la cabeza del beb hasta el nacimiento.  Telemetra. Se trata de un tipo de monitoreo continuo que se puede realizar con monitores externos o internos. En lugar de tener que permanecer en la cama, usted puede moverse durante la telemetra. Examen fsico Su mdico puede realizar exmenes fsicos frecuentes. Esto puede incluir lo siguiente:  Verificar cmo y dnde el beb est ubicado en el tero.  Verificar el cuello uterino para determinar: ? Si se est afinando o estirando (borrando). ? Si se est abriendo (dilatando). Qu sucede durante el trabajo de parto y el parto? El trabajo de parto y el parto normales se dividen en tres etapas: Etapa 1  Esta es la etapa ms larga del trabajo de parto.  Esta etapa puede durar horas o das.  Durante esta etapa, sentir contracciones. En general, las contracciones son leves, infrecuentes e irregulares al principio. Se hacen ms fuertes, ms frecuentes (aproximadamente cada 2 o 3 minutos) y ms regulares a medida que avanza en esta etapa.  Esta etapa finaliza cuando el cuello uterino est completamente dilatado hasta 4 pulgadas (10cm) y completamente borrado. Etapa 2  Esta etapa comienza una vez que el cuello uterino est totalmente borrado y dilatado, y dura hasta el nacimiento del beb.  Esta etapa puede durar de 20 minutos a 2 horas.  Esta es la etapa en la que va a sentir ganas de pujar al beb fuera de la vagina.    Puede sentir un dolor urente y por estiramiento, especialmente cuando la parte ms ancha de la cabeza del beb pasa a travs de la abertura vaginal (coronacin).  Una vez que el beb nace, el cordn umbilical se pinzar y se cortar. Esto ocurre por lo general despus  de un perodo de 1 a 2 minutos despus del parto.  Colocarn al beb sobre su pecho desnudo (contacto piel con piel) en una posicin erguida y [place] con [place] Pita abrigada. Observe al beb para detectar seales de hambre, como el reflejo de bsqueda o succin, y acrquelo al pecho para su primera alimentacin. Etapa 3  Esta etapa comienza inmediatamente despus del nacimiento del beb y finaliza despus de la expulsin de la placenta.  Esta etapa puede durar de 5 a 30 minutos.  Despus del nacimiento del beb, puede sentir contracciones cuando el cuerpo expulsa la placenta y el tero se contrae para Radio broadcast assistant.   Qu puedo esperar despus del Aleen Campi de parto y [place]?  Una vez que termine el trabajo de [place], se los controlar a usted y al beb atentamente para Warehouse manager la seguridad de que ambos estn sanos y listos para ir a Higher education careers adviser. Su equipo de atencin Art gallery manager cmo cuidarse y cuidar a su beb.  Usted y el beb permanecern en la misma habitacin (cohabitacin) durante su estada en el hospital. Esto estimular una vinculacin temprana y Elmer Bales [place].  Puede seguir recibiendo lquidos o medicamentos por va intravenosa.  Se le controlar y Engineer, maintenance (IT) el tero con regularidad (masaje fndico).  Tendr algo de inflamacin y dolor en el abdomen, la vagina y la zona de la piel entre la abertura vaginal y el ano (perineo).  Si se le realiz una incisin cerca de la vagina (episiotoma) o si ha tenido Airline pilot parto, podran indicarle que se coloque compresas fras sobre la episiotoma o Art therapist. Esto ayuda a Engineer, materials y la hinchazn.  Es posible que le den una botella rociadora para que use cuando vaya al bao para higienizarse. Siga los pasos a continuacin para usar la botella rociadora: ? Antes de orinar, llene la botella rociadora con agua tibia. No use agua caliente. ? Despus de Geographical information systems officer, [place] an est sentada en el inodoro,  use la botella rociadora para enjuagar el rea alrededor de la uretra y la abertura vaginal. Con esto podr limpiar cualquier rastro de orina y [place]. ? Llene la botella rociadora con agua limpia cada vez que vaya al bao.  Es normal tener hemorragia vaginal despus del [place]. Use un apsito sanitario para el sangrado vaginal y secrecin. Resumen  Parto vaginal significa que usted dar a luz empujando al beb fuera del canal del parto (vagina).  Su mdico puede monitorear las contracciones (monitoreo uterino) y la frecuencia cardaca del beb (monitoreo fetal).  Su mdico puede realizarle un examen fsico.  El trabajo de parto y el parto normales se dividen en tres etapas.  Una vez que termina el [place] de [place], se los controlar a usted y al beb atentamente hasta que estn listos para ir a casa. Esta informacin no tiene Theme park manager el consejo del mdico. Asegrese de hacerle al mdico cualquier pregunta que tenga. Document Revised: 05/15/2017 Document Reviewed: 05/15/2017 Elsevier Patient Education  2021 Elsevier Inc.  Parto vaginal Vaginal Delivery  Parto vaginal significa que usted da a luz empujando al beb fuera del canal del parto (vagina). Un equipo de proveedores de atencin mdica la ayudar  antes, durante y despus del parto vaginal. Las experiencias de los nacimientos son nicas para todas las [place], y Sports administrator y las experiencias de nacimiento varan segn dnde elija dar a luz. Ladell Heads ocurrir cuando llegue al centro de [place] o al hospital? Pollyann Savoy que se inicie el [place] de parto y haya sido admitida en el hospital o centro de parto, el mdico podr hacer lo siguiente:  Revisar sus antecedentes de Psychiatrist y cualquier inquietud que usted pueda tener.  Colocarle una va intravenosa en una de las venas. Esto se podr usar para administrarle lquidos y medicamentos.  Verificar su presin arterial, pulso, temperatura y frecuencia cardaca (signos  vitales).  Verificar si la bolsa de agua (saco amnitico) se ha roto (ruptura).  Hablar con usted sobre su plan de nacimiento y Chiropractor las opciones para Human resources officer. Monitoreo Su mdico puede monitorear las contracciones (monitoreo uterino) y la frecuencia cardaca del beb (monitoreo fetal). Es posible que el monitoreo se necesite realizar:  Con frecuencia, pero no continuamente (intermitentemente).  Todo el tiempo o durante largos perodos a la vez (continuamente). El monitoreo continuo puede ser necesario si: ? Est recibiendo determinados medicamentos, tales como medicamentos para Engineer, materials o para hacer que las contracciones sean ms fuertes. ? Tiene complicaciones durante el embarazo o el [place] de [place]. El monitoreo se puede realizar:  Al colocar un estetoscopio especial o un dispositivo manual de monitoreo en el abdomen o verificar los latidos cardacos del beb y comprobar las contracciones.  Al colocar monitores en el abdomen (monitores externos) para Passenger transport manager los latidos cardacos del beb y la frecuencia y duracin de las contracciones.  Al colocar monitores dentro del tero a travs de la vagina (monitores internos) para Passenger transport manager los latidos cardacos del beb y la frecuencia, duracin y fuerza de sus contracciones. Segn el tipo de monitor, Insurance claims handler en el tero o en la cabeza del beb hasta el nacimiento.  Telemetra. Se trata de un tipo de monitoreo continuo que se puede Education officer, environmental con monitores externos o internos. En lugar de Hospital doctor en la cama, usted puede moverse durante Fish farm manager. Examen fsico Su mdico puede realizar exmenes fsicos frecuentes. Esto puede incluir lo siguiente:  Investment banker, operational cmo y dnde el beb est ubicado en el tero.  Verificar el cuello uterino para determinar: ? Si se est afinando o estirando (borrando). ? Si se est abriendo (dilatando). Qu sucede durante el [place] de parto y [place]? [place]  de parto y el parto normales se dividen en tres etapas: Etapa 1  Esta es la etapa ms larga del trabajo de [place].  Esta etapa puede durar [employer].  Durante esta etapa, sentir contracciones. En general, las contracciones son leves, infrecuentes e irregulares al principio. Se hacen ms fuertes, ms frecuentes (aproximadamente cada 2 o 3 minutos) y ms regulares a medida que avanza en esta etapa.  Esta etapa finaliza cuando el cuello uterino est completamente dilatado hasta 4 pulgadas (10cm) y completamente borrado. Etapa 2  Esta etapa comienza una vez que el cuello uterino est totalmente borrado y dilatado, y dura hasta el nacimiento del beb.  Esta etapa puede durar de 20 minutos a 2 horas.  Esta es la etapa en la que va a sentir ganas de pujar al beb fuera de la vagina.  Puede sentir un dolor urente y por estiramiento, especialmente cuando la parte ms ancha de la cabeza del beb pasa a travs de la abertura vaginal (coronacin).  Neomia Dear  vez que el beb nace, el cordn umbilical se pinzar y se cortar. Esto ocurre por lo general despus de un perodo de 1 a 2 minutos despus del parto.  Colocarn al beb sobre su pecho desnudo (contacto piel con piel) en una posicin erguida y [place] con [place] Pita abrigada. Observe al beb para detectar seales de hambre, como el reflejo de bsqueda o succin, y acrquelo al pecho para su primera alimentacin. Etapa 3  Esta etapa comienza inmediatamente despus del nacimiento del beb y finaliza despus de la expulsin de la placenta.  Esta etapa puede durar de 5 a 30 minutos.  Despus del nacimiento del beb, puede sentir contracciones cuando el cuerpo expulsa la placenta y el tero se contrae para Radio broadcast assistant.   Qu puedo esperar despus del Aleen Campi de parto y [place]?  Una vez que termine el trabajo de [place], se los controlar a usted y al beb atentamente para Warehouse manager la seguridad de que ambos estn sanos y listos para ir a  Higher education careers adviser. Su equipo de atencin Art gallery manager cmo cuidarse y cuidar a su beb.  Usted y el beb permanecern en la misma habitacin (cohabitacin) durante su estada en el hospital. Esto estimular una vinculacin temprana y Elmer Bales [place].  Puede seguir recibiendo lquidos o medicamentos por va intravenosa.  Se le controlar y Engineer, maintenance (IT) el tero con regularidad (masaje fndico).  Tendr algo de inflamacin y dolor en el abdomen, la vagina y la zona de la piel entre la abertura vaginal y el ano (perineo).  Si se le realiz una incisin cerca de la vagina (episiotoma) o si ha tenido Airline pilot parto, podran indicarle que se coloque compresas fras sobre la episiotoma o Art therapist. Esto ayuda a Engineer, materials y la hinchazn.  Es posible que le den una botella rociadora para que use cuando vaya al bao para higienizarse. Siga los pasos a continuacin para usar la botella rociadora: ? Antes de orinar, llene la botella rociadora con agua tibia. No use agua caliente. ? Despus de Geographical information systems officer, [place] an est sentada en el inodoro, use la botella rociadora para enjuagar el rea alrededor de la uretra y la abertura vaginal. Con esto podr limpiar cualquier rastro de orina y [place]. ? Llene la botella rociadora con agua limpia cada vez que vaya al bao.  Es normal tener hemorragia vaginal despus del [place]. Use un apsito sanitario para el sangrado vaginal y secrecin. Resumen  Parto vaginal significa que usted dar a luz empujando al beb fuera del canal del parto (vagina).  Su mdico puede monitorear las contracciones (monitoreo uterino) y la frecuencia cardaca del beb (monitoreo fetal).  Su mdico puede realizarle un examen fsico.  El trabajo de parto y el parto normales se dividen en tres etapas.  Una vez que termina el [place] de [place], se los controlar a usted y al beb atentamente hasta que estn listos para ir a casa. Esta informacin no tiene Microbiologist el consejo del mdico. Asegrese de hacerle al mdico cualquier pregunta que tenga. Document Revised: 05/15/2017 Document Reviewed: 05/15/2017 Elsevier Patient Education  2021 [employer].

## 2020-04-06 NOTE — Progress Notes (Signed)
Subjective:  Nichole [name] is a 20 y.o. G1P0 at [redacted]w[redacted]d being seen today for ongoing prenatal care.  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  Patient reports general discomforts of pregnancy.  Contractions: Irregular. Vag. Bleeding: Scant.  Movement: Present. Denies leaking of fluid.   The following portions of the patient's history were reviewed and updated as appropriate: allergies, current medications, past family history, past medical history, past social history, past surgical history and problem list. Problem list updated.  Objective:   Vitals:   04/06/20 1622  BP: 107/71  Pulse: 94  Weight: 197 lb 12.8 oz (89.7 kg)    Fetal Status:     Movement: Present     General:  Alert, oriented and cooperative. Patient is in no acute distress.  Skin: Skin is warm and dry. No rash noted.   Cardiovascular: Normal heart rate noted  Respiratory: Normal respiratory effort, no problems with respiration noted  Abdomen: Soft, gravid, appropriate for gestational age. Pain/Pressure: Present     Pelvic:  Cervical exam deferred        Extremities: Normal range of motion.  Edema: Trace  Mental Status: Normal mood and affect. Normal behavior. Normal judgment and thought content.   Urinalysis:      Assessment and Plan:  Pregnancy: G1P0 at [redacted]w[redacted]d  1. Encounter for supervision of normal first pregnancy in third trimester Stable Labor precautions  2. Positive testing for group B Streptococcus Tx while in labor  3. Cystic fibrosis carrier   Term labor symptoms and general obstetric precautions including but not limited to vaginal bleeding, contractions, leaking of fluid and fetal movement were reviewed in detail with the patient. Please refer to After Visit Summary for other counseling  recommendations.  Return in about 1 week (around 04/13/2020) for OB visit, face to face, any provider.   Hermina Staggers, MD

## 2020-04-14 ENCOUNTER — Other Ambulatory Visit: Payer: Self-pay

## 2020-04-14 ENCOUNTER — Ambulatory Visit (INDEPENDENT_AMBULATORY_CARE_PROVIDER_SITE_OTHER): Payer: Medicaid Other | Admitting: Advanced Practice Midwife

## 2020-04-14 VITALS — BP 131/79 | HR 103 | Wt 200.0 lb

## 2020-04-14 DIAGNOSIS — Z3A38 38 weeks gestation of pregnancy: Secondary | ICD-10-CM

## 2020-04-14 DIAGNOSIS — Z3403 Encounter for supervision of normal first pregnancy, third trimester: Secondary | ICD-10-CM

## 2020-04-14 NOTE — Patient Instructions (Signed)

## 2020-04-14 NOTE — Progress Notes (Signed)
   LOW-RISK PREGNANCY VISIT Patient name: Nichole [name] MRN [id number]  Date of birth: Mar 30, 2000 Chief Complaint:   Routine Prenatal Visit  History of Present Illness:   Nichole [name] is a 20 y.o. G1P0 female at [redacted]w[redacted]d with an Estimated Date of Delivery: 04/23/20 being seen today for ongoing management of a low-risk pregnancy.  Today she reports no complaints. Contractions: Irregular. Vag. Bleeding: None.  Movement: Present. denies leaking of fluid. Review of Systems:   Pertinent items are noted in HPI Denies abnormal vaginal discharge w/ itching/odor/irritation, headaches, visual changes, shortness of breath, chest pain, abdominal pain, severe nausea/vomiting, or problems with urination or bowel movements unless otherwise stated above. Pertinent History Reviewed:  Reviewed past medical,surgical, social, obstetrical and family history.  Reviewed problem list, medications and allergies. Physical Assessment:   Vitals:   04/14/20 0911  BP: 131/79  Pulse: (!) 103  Weight: 200 lb (90.7 kg)  Body mass index is 34.33 kg/m.        Physical Examination:   General appearance: Well appearing, and in no distress  Mental status: Alert, oriented to person, place, and time  Skin: Warm & dry  Cardiovascular: Normal heart rate noted  Respiratory: Normal respiratory effort, no distress  Abdomen: Soft, gravid, nontender  Pelvic: Cervical exam performed  Dilation: 2.5 Effacement (%): 50 Station: -1  Extremities: Edema: Trace  Fetal Status: Fetal Heart Rate (bpm): 131 Fundal Height: 38 cm Movement: Present Presentation: Vertex  Chaperone: Amanda Rash    No results found for this or any previous visit (from the past 24 hour(s)).  Assessment & Plan:  1) Low-risk pregnancy G1P0 at [redacted]w[redacted]d with an Estimated Date of Delivery: 04/23/20      Meds: No orders of the defined types were placed in this encounter.  Labs/procedures today:   Plan:  Continue routine obstetrical care  Next visit:  prefers in person    Reviewed: Term labor symptoms and general obstetric precautions including but not limited to vaginal bleeding, contractions, leaking of fluid and fetal movement were reviewed in detail with the patient.  All questions were answered. Has home bp cuff. Check bp weekly, let us know if >140/90.   Follow-up: Return in about 1 week (around 04/21/2020) for LROB.  No orders of the defined types were placed in this encounter.  Jacklyn Shell DNP, CNM 04/14/2020 1:30 PM

## 2020-04-24 ENCOUNTER — Other Ambulatory Visit: Payer: Self-pay

## 2020-04-24 ENCOUNTER — Encounter (HOSPITAL_COMMUNITY): Payer: Self-pay | Admitting: Obstetrics and Gynecology

## 2020-04-24 ENCOUNTER — Inpatient Hospital Stay (HOSPITAL_COMMUNITY)
Admission: AD | Admit: 2020-04-24 | Discharge: 2020-04-24 | Disposition: A | Discharge status: Home / Self Care | Payer: Medicaid Other | Source: Home / Self Care | Attending: Obstetrics and Gynecology | Admitting: Obstetrics and Gynecology

## 2020-04-24 DIAGNOSIS — Z3A40 40 weeks gestation of pregnancy: Secondary | ICD-10-CM | POA: Insufficient documentation

## 2020-04-24 DIAGNOSIS — O480 Post-term pregnancy: Secondary | ICD-10-CM | POA: Insufficient documentation

## 2020-04-24 DIAGNOSIS — R519 Headache, unspecified: Secondary | ICD-10-CM | POA: Insufficient documentation

## 2020-04-24 DIAGNOSIS — O26893 Other specified pregnancy related conditions, third trimester: Secondary | ICD-10-CM | POA: Insufficient documentation

## 2020-04-24 DIAGNOSIS — O471 False labor at or after 37 completed weeks of gestation: Secondary | ICD-10-CM | POA: Insufficient documentation

## 2020-04-24 DIAGNOSIS — O479 False labor, unspecified: Secondary | ICD-10-CM

## 2020-04-24 DIAGNOSIS — Z3403 Encounter for supervision of normal first pregnancy, third trimester: Secondary | ICD-10-CM

## 2020-04-24 MED ORDER — ACETAMINOPHEN 500 MG PO TABS
1000.0000 mg | ORAL_TABLET | Freq: Once | ORAL | Status: AC
  Administered 2020-04-24: 1000 mg via ORAL
  Filled 2020-04-24: qty 2

## 2020-04-24 NOTE — MAU Provider Note (Signed)
Event Date/Time   First Provider Initiated Contact with Patient 04/24/20 2140      S [name] is a 20 y.o. G1P0 patient who presents to MAU today with complaint of contractions. She reports irregular contractions and denies any leaking or bleeding. Reports normal fetal movement. She reports a headache that she rates a 4/10 and is requesting tylenol. Denies any hx of hypertension in the pregnancy  O BP 124/78   Pulse 73   Temp 98.7 F (37.1 C) (Oral)   Resp 17   Ht 5\' 3"  (1.6 m)   Wt 92.1 kg   LMP 07/18/2019   SpO2 100%   BMI 35.96 kg/m  Physical Exam Vitals and nursing note reviewed.  Constitutional:      General: She is not in acute distress.    Appearance: She is well-developed and well-nourished.  HENT:     Head: Normocephalic.  Eyes:     Pupils: Pupils are equal, round, and reactive to light.  Cardiovascular:     Rate and Rhythm: Normal rate and regular rhythm.     Heart sounds: Normal heart sounds.  Pulmonary:     Effort: Pulmonary effort is normal. No respiratory distress.     Breath sounds: Normal breath sounds.  Abdominal:     General: Bowel sounds are normal. There is no distension.     Palpations: Abdomen is soft.     Tenderness: There is no abdominal tenderness.  Skin:    General: Skin is warm and dry.  Neurological:     Mental Status: She is alert and oriented to person, place, and time.  Psychiatric:        Mood and Affect: Mood and affect normal.        Behavior: Behavior normal.        Thought Content: Thought content normal.        Judgment: Judgment normal.    Tylenol PO  A Medical screening exam complete Headache in pregnancy  P Patient to be RN labor eval after tylenol  09/17/2019, CNM 04/24/2020 11:03 PM

## 2020-04-24 NOTE — Discharge Instructions (Signed)

## 2020-04-24 NOTE — Progress Notes (Signed)
I have communicated with Franchot Erichsen, resident and reviewed vital signs:  Vitals:   04/24/20 2146 04/24/20 2240  BP: 109/70 124/78  Pulse: 73 73  Resp:    Temp:    SpO2:      Vaginal exam:  Dilation: 3 Effacement (%): 70 Station: -2 Presentation: Vertex Exam by:: Karl Ito, rnc,   Also reviewed contraction pattern and that non-stress test is reactive.  It has been documented that patient is contracting every 3-7 minutes with no cervical change over 1 hour not indicating active labor.  Patient c/o of HA which was relieved with 1000mg  of tylenol.  Based on this report provider has given order for discharge.  A discharge order and diagnosis entered by a provider.   Labor discharge instructions reviewed with patient.

## 2020-04-24 NOTE — MAU Note (Signed)
Contractions since 7am, unsure of frequency. Positive FM, denies bleeding or leaking of fluid

## 2020-04-25 ENCOUNTER — Encounter: Payer: Self-pay | Admitting: Women's Health

## 2020-04-25 ENCOUNTER — Ambulatory Visit (INDEPENDENT_AMBULATORY_CARE_PROVIDER_SITE_OTHER): Payer: Medicaid Other | Admitting: Women's Health

## 2020-04-25 ENCOUNTER — Inpatient Hospital Stay (HOSPITAL_COMMUNITY)
Admission: AD | Admit: 2020-04-25 | Discharge: 2020-04-28 | DRG: 806 | Disposition: A | Discharge status: Home / Self Care | Payer: Medicaid Other | Attending: Obstetrics & Gynecology | Admitting: Obstetrics & Gynecology

## 2020-04-25 ENCOUNTER — Telehealth (HOSPITAL_COMMUNITY): Payer: Self-pay | Admitting: *Deleted

## 2020-04-25 ENCOUNTER — Inpatient Hospital Stay (EMERGENCY_DEPARTMENT_HOSPITAL)
Admission: AD | Admit: 2020-04-25 | Discharge: 2020-04-25 | Disposition: A | Discharge status: Home / Self Care | Payer: Medicaid Other | Source: Home / Self Care | Attending: Family Medicine | Admitting: Family Medicine

## 2020-04-25 ENCOUNTER — Encounter (HOSPITAL_COMMUNITY): Payer: Self-pay | Admitting: Anesthesiology

## 2020-04-25 ENCOUNTER — Encounter (HOSPITAL_COMMUNITY): Payer: Self-pay | Admitting: Family Medicine

## 2020-04-25 ENCOUNTER — Encounter (HOSPITAL_COMMUNITY): Payer: Self-pay | Admitting: Obstetrics & Gynecology

## 2020-04-25 VITALS — BP 121/71 | HR 87 | Wt 199.0 lb

## 2020-04-25 DIAGNOSIS — O26893 Other specified pregnancy related conditions, third trimester: Secondary | ICD-10-CM | POA: Diagnosis not present

## 2020-04-25 DIAGNOSIS — D62 Acute posthemorrhagic anemia: Secondary | ICD-10-CM | POA: Diagnosis not present

## 2020-04-25 DIAGNOSIS — Z3A40 40 weeks gestation of pregnancy: Secondary | ICD-10-CM

## 2020-04-25 DIAGNOSIS — Z3403 Encounter for supervision of normal first pregnancy, third trimester: Secondary | ICD-10-CM

## 2020-04-25 DIAGNOSIS — O471 False labor at or after 37 completed weeks of gestation: Secondary | ICD-10-CM

## 2020-04-25 DIAGNOSIS — O9952 Diseases of the respiratory system complicating childbirth: Secondary | ICD-10-CM | POA: Diagnosis present

## 2020-04-25 DIAGNOSIS — O479 False labor, unspecified: Secondary | ICD-10-CM

## 2020-04-25 DIAGNOSIS — O99824 Streptococcus B carrier state complicating childbirth: Secondary | ICD-10-CM | POA: Diagnosis present

## 2020-04-25 DIAGNOSIS — Z20822 Contact with and (suspected) exposure to covid-19: Secondary | ICD-10-CM | POA: Diagnosis present

## 2020-04-25 DIAGNOSIS — Z141 Cystic fibrosis carrier: Secondary | ICD-10-CM

## 2020-04-25 DIAGNOSIS — Z30017 Encounter for initial prescription of implantable subdermal contraceptive: Secondary | ICD-10-CM | POA: Diagnosis not present

## 2020-04-25 DIAGNOSIS — B951 Streptococcus, group B, as the cause of diseases classified elsewhere: Secondary | ICD-10-CM

## 2020-04-25 DIAGNOSIS — J45909 Unspecified asthma, uncomplicated: Secondary | ICD-10-CM | POA: Diagnosis not present

## 2020-04-25 DIAGNOSIS — O9081 Anemia of the puerperium: Secondary | ICD-10-CM | POA: Diagnosis not present

## 2020-04-25 DIAGNOSIS — O480 Post-term pregnancy: Secondary | ICD-10-CM | POA: Insufficient documentation

## 2020-04-25 DIAGNOSIS — Z3483 Encounter for supervision of other normal pregnancy, third trimester: Secondary | ICD-10-CM

## 2020-04-25 DIAGNOSIS — Z975 Presence of (intrauterine) contraceptive device: Secondary | ICD-10-CM

## 2020-04-25 DIAGNOSIS — O326XX0 Maternal care for compound presentation, not applicable or unspecified: Secondary | ICD-10-CM | POA: Diagnosis not present

## 2020-04-25 DIAGNOSIS — Z880 Allergy status to penicillin: Secondary | ICD-10-CM | POA: Diagnosis not present

## 2020-04-25 DIAGNOSIS — O99820 Streptococcus B carrier state complicating pregnancy: Secondary | ICD-10-CM | POA: Diagnosis not present

## 2020-04-25 LAB — TYPE AND SCREEN
ABO/RH(D): O POS
Antibody Screen: NEGATIVE

## 2020-04-25 LAB — SARS CORONAVIRUS 2 BY RT PCR (HOSPITAL ORDER, PERFORMED IN ~~LOC~~ HOSPITAL LAB): SARS Coronavirus 2: NEGATIVE

## 2020-04-25 LAB — CBC
HCT: 36.9 % (ref 36.0–46.0)
Hemoglobin: 12.3 g/dL (ref 12.0–15.0)
MCH: 27 pg (ref 26.0–34.0)
MCHC: 33.3 g/dL (ref 30.0–36.0)
MCV: 81.1 fL (ref 80.0–100.0)
Platelets: 217 10*3/uL (ref 150–400)
RBC: 4.55 MIL/uL (ref 3.87–5.11)
RDW: 14.7 % (ref 11.5–15.5)
WBC: 10.3 10*3/uL (ref 4.0–10.5)
nRBC: 0 % (ref 0.0–0.2)

## 2020-04-25 MED ORDER — FENTANYL CITRATE (PF) 100 MCG/2ML IJ SOLN
50.0000 ug | Freq: Once | INTRAMUSCULAR | Status: AC
  Administered 2020-04-25: 50 ug via INTRAMUSCULAR
  Filled 2020-04-25: qty 2

## 2020-04-25 MED ORDER — PENICILLIN G POT IN DEXTROSE 60000 UNIT/ML IV SOLN
3.0000 10*6.[IU] | INTRAVENOUS | Status: DC
  Administered 2020-04-26 (×2): 3 10*6.[IU] via INTRAVENOUS
  Filled 2020-04-25 (×2): qty 50

## 2020-04-25 MED ORDER — OXYCODONE-ACETAMINOPHEN 5-325 MG PO TABS: 2.0000 | ORAL_TABLET | ORAL | Status: DC | PRN

## 2020-04-25 MED ORDER — FENTANYL CITRATE (PF) 100 MCG/2ML IJ SOLN
100.0000 ug | INTRAMUSCULAR | Status: DC | PRN
  Administered 2020-04-26 (×4): 100 ug via INTRAVENOUS
  Filled 2020-04-25 (×3): qty 2

## 2020-04-25 MED ORDER — OXYTOCIN BOLUS FROM INFUSION
333.0000 mL | Freq: Once | INTRAVENOUS | Status: AC
  Administered 2020-04-26: 333 mL via INTRAVENOUS

## 2020-04-25 MED ORDER — TERBUTALINE SULFATE 1 MG/ML IJ SOLN: 0.2500 mg | Freq: Once | INTRAMUSCULAR | Status: DC | PRN

## 2020-04-25 MED ORDER — HYDROXYZINE HCL 50 MG PO TABS: 50.0000 mg | ORAL_TABLET | Freq: Four times a day (QID) | ORAL | Status: DC | PRN

## 2020-04-25 MED ORDER — OXYTOCIN-SODIUM CHLORIDE 30-0.9 UT/500ML-% IV SOLN
2.5000 [IU]/h | INTRAVENOUS | Status: DC
  Administered 2020-04-26: 2.5 [IU]/h via INTRAVENOUS
  Filled 2020-04-25: qty 500

## 2020-04-25 MED ORDER — SOD CITRATE-CITRIC ACID 500-334 MG/5ML PO SOLN: 30.0000 mL | ORAL | Status: DC | PRN

## 2020-04-25 MED ORDER — SODIUM CHLORIDE 0.9 % IV SOLN
5.0000 10*6.[IU] | Freq: Once | INTRAVENOUS | Status: AC
  Administered 2020-04-25: 5 10*6.[IU] via INTRAVENOUS
  Filled 2020-04-25: qty 5

## 2020-04-25 MED ORDER — ONDANSETRON HCL 4 MG/2ML IJ SOLN: 4.0000 mg | Freq: Four times a day (QID) | INTRAMUSCULAR | Status: DC | PRN

## 2020-04-25 MED ORDER — LACTATED RINGERS IV SOLN: INTRAVENOUS | Status: DC

## 2020-04-25 MED ORDER — ACETAMINOPHEN 325 MG PO TABS: 650.0000 mg | ORAL_TABLET | ORAL | Status: DC | PRN

## 2020-04-25 MED ORDER — LACTATED RINGERS IV SOLN
500.0000 mL | INTRAVENOUS | Status: DC | PRN
  Administered 2020-04-26: 1000 mL via INTRAVENOUS

## 2020-04-25 MED ORDER — OXYCODONE-ACETAMINOPHEN 5-325 MG PO TABS: 1.0000 | ORAL_TABLET | ORAL | Status: DC | PRN

## 2020-04-25 MED ORDER — LIDOCAINE HCL (PF) 1 % IJ SOLN
30.0000 mL | INTRAMUSCULAR | Status: DC | PRN
  Filled 2020-04-25: qty 30

## 2020-04-25 MED ORDER — FLEET ENEMA 7-19 GM/118ML RE ENEM: 1.0000 | ENEMA | RECTAL | Status: DC | PRN

## 2020-04-25 MED ORDER — OXYTOCIN-SODIUM CHLORIDE 30-0.9 UT/500ML-% IV SOLN: 1.0000 m[IU]/min | INTRAVENOUS | Status: DC

## 2020-04-25 NOTE — Progress Notes (Signed)
   LOW-RISK PREGNANCY VISIT Patient name: Nichole [name] MRN [id number]  Date of birth: 02-23-2001 Chief Complaint:   Routine Prenatal Visit  History of Present Illness:   Nichole [name] is a 20 y.o. G1P0 female at [redacted]w[redacted]d with an Estimated Date of Delivery: 04/23/20 being seen today for ongoing management of a low-risk pregnancy.  Depression screen [facility] 2/9 01/15/2020 10/21/2019 08/26/2019 02/26/2019 11/21/2017  Decreased Interest 1 0 0 0 0  Down, Depressed, Hopeless 0 0 0 0 0  PHQ - 2 Score 1 0 0 0 0  Altered sleeping 1 0 - - -  Tired, decreased energy 1 1 - - -  Change in appetite 0 0 - - -  Feeling bad or failure about yourself  0 0 - - -  Trouble concentrating 0 1 - - -  Moving slowly or fidgety/restless 0 0 - - -  Suicidal thoughts 0 0 - - -  PHQ-9 Score 3 2 - - -    Today she reports went to MAU last night w/ headache and contractions. HA r/b apap. Cx 3/70/-2. Had reactive NST. Contractions: Irregular. Vag. Bleeding: Scant.  Movement: Present. denies leaking of fluid. Review of Systems:   Pertinent items are noted in HPI Denies abnormal vaginal discharge w/ itching/odor/irritation, headaches, visual changes, shortness of breath, chest pain, abdominal pain, severe nausea/vomiting, or problems with urination or bowel movements unless otherwise stated above. Pertinent History Reviewed:  Reviewed past medical,surgical, social, obstetrical and family history.  Reviewed problem list, medications and allergies. Physical Assessment:   Vitals:   04/25/20 1047  BP: 121/71  Pulse: 87  Weight: 199 lb (90.3 kg)  Body mass index is 35.25 kg/m.        Physical Examination:   General appearance: Well appearing, and in no distress  Mental status: Alert, oriented to person, place, and time  Skin: Warm & dry  Cardiovascular: Normal heart rate noted  Respiratory: Normal respiratory effort, no distress  Abdomen: Soft, gravid, nontender  Pelvic: Cervical exam performed  Dilation: 3.5  Effacement (%): 70 Station: -2 Offered membrane sweeping, discussed r/b- pt decided to proceed, so membranes swept.   Extremities: Edema: Trace  Fetal Status: Fetal Heart Rate (bpm): 135 Fundal Height: 38 cm Movement: Present Presentation: Vertex  Chaperone: Peggy Dones   No results found for this or any previous visit (from the past 24 hour(s)).  Assessment & Plan:  1) Low-risk pregnancy G1P0 at [redacted]w[redacted]d with an Estimated Date of Delivery: 04/23/20   2) Postdates, reactive NST last night in MAU. Scheduled 2/12 AM for postdates IOL.  IOL form faxed via Epic and orders placed. Membranes swept today   Meds: No orders of the defined types were placed in this encounter.  Labs/procedures today: sve, membrane sweept  Reviewed: Term labor symptoms and general obstetric precautions including but not limited to vaginal bleeding, contractions, leaking of fluid and fetal movement were reviewed in detail with the patient.  All questions were answered. Has home bp cuff.  Check bp weekly, let us know if >140/90.   Follow-up: Return for will schedule pp visit after baby.  Future Appointments  Date Time Provider Department Center  04/30/2020  6:50 AM [facility] SCHED ROOM [facility] None    No orders of the defined types were placed in this encounter.  Cheral Marker CNM, [facility] 04/25/2020 11:17 AM

## 2020-04-25 NOTE — MAU Note (Signed)
...  Nichole [name] is a 20 y.o. at [redacted]w[redacted]d here in MAU reporting: Sent home yesterday and today from MAU with labor precautions. CTX have increased in intensity and are closer together. Pt reports every few minutes. +FM. No LOF but endorses bloody show.    FHT: 135 external

## 2020-04-25 NOTE — MAU Note (Addendum)
.  Nichole [name] is a 20 y.o. at [redacted]w[redacted]d here in MAU reporting: ctx that started at 1300 this evening. No LOF. Reports bloody show, had membranes swept in office. States that they are now 5 mins apart. Rating pain 10/10.

## 2020-04-25 NOTE — Progress Notes (Addendum)
S: [name] is a 20 y.o. G1P0 at [redacted]w[redacted]d  who presents to MAU today for labor evaluation.     Cervical exam by RN:  Dilation: 3.5 Effacement (%): 70 Station: -2 Presentation: Vertex Exam by:: Holly Flippin RN  Fetal Monitoring: Baseline: 130 Variability: Moderate Accelerations: Present  Decelerations: None Contractions: Irregular q2-8 min  MDM Discussed patient with RN. NST reviewed.   A: SIUP at [redacted]w[redacted]d  False labor  P: Discharge home Labor precautions and kick counts included in AVS Patient to follow-up with OB/GYN as scheduled  Patient may return to MAU as needed or when in labor   Gypsy Decant, MD 04/25/2020 5:52 PM   GME ATTESTATION:  I saw and evaluated the patient. I agree with the findings and the plan of care as documented in the resident's note.  Alric Seton, MD OB Fellow, Faculty [facility], Center for [facility] 04/25/2020 6:26 PM

## 2020-04-25 NOTE — Telephone Encounter (Signed)
Preadmission screen  

## 2020-04-25 NOTE — H&P (Addendum)
OBSTETRIC ADMISSION HISTORY AND PHYSICAL  Nichole [name] is a 20 y.o. female G1P0 with IUP at 32w2dby LMP presenting for labor. She reports +FMs, No LOF, no VB, no blurry vision, headaches or peripheral edema, and RUQ pain.  She plans on breast feeding. She requests nexplanon for birth control. [facility]  Dating: By LMP --->  Estimated Date of Delivery: 04/23/20  Sono:    '@[redacted]w[redacted]d' , CWD, normal anatomy, breech presentation,  226g, 62% EFW   Prenatal History/Complications:  Cystic Fibrosis carrier   Past Medical History: Past Medical History:  Diagnosis Date   Asthma     Past Surgical History: Past Surgical History:  Procedure Laterality Date   NO PAST SURGERIES      Obstetrical History: OB History     Gravida  1   Para      Term      Preterm      AB      Living         SAB      IAB      Ectopic      Multiple      Live Births              Social History Social History   Socioeconomic History   Marital status: Single    Spouse name: Not on file   Number of children: Not on file   Years of education: Not on file   Highest education level: Not on file  Occupational History   Not on file  Tobacco Use   Smoking status: Never Smoker   Smokeless tobacco: Never Used  Vaping Use   Vaping Use: Never used  Substance and Sexual Activity   Alcohol use: No   Drug use: No   Sexual activity: Yes    Birth control/protection: None  Other Topics Concern   Not on file  Social History Narrative   Not on file   Social Determinants of Health   Financial Resource Strain: Medium Risk   Difficulty of Paying Living Expenses: Somewhat hard  Food Insecurity: No Food Insecurity   Worried About RCharity fundraiserin the Last Year: Never true   Ran Out of Food in the Last Year: Never true  Transportation Needs: Unmet Transportation Needs   Lack of Transportation (Medical): No   Lack of Transportation (Non-Medical):  Yes  Physical Activity: Sufficiently Active   Days of Exercise per Week: 6 days   Minutes of Exercise per Session: 30 min  Stress: No Stress Concern Present   Feeling of Stress : Only a little  Social Connections: SE[employer], building servicesof Communication with Friends and Family: Three times a week   Frequency of Social Gatherings with Friends and Family: Once a week   Attends Religious Services: More than 4 times per year   Active Member of [employer] Organizations: Yes   Attends CArchivistMeetings: Never   Marital Status: Married    Family History: Family History  Problem Relation Age of Onset   Diabetes Father     Allergies: No Known Allergies  Pt denies allergies to latex, iodine, or shellfish.  Medications Prior to Admission  Medication Sig Dispense Refill Last Dose   acetaminophen (TYLENOL) 325 MG tablet Take 2 tablets (650 mg total) by mouth every 6 (six) hours as needed for mild pain or fever. 30 tablet 1    albuterol (VENTOLIN HFA) 108 (90  Base) MCG/ACT inhaler Inhale 2 puffs into the lungs every 6 (six) hours as needed for wheezing or shortness of breath. 18 g 1    Blood Pressure Monitor MISC For regular home bp monitoring during pregnancy 1 each 0    docusate sodium (COLACE) 250 MG capsule Take 1 capsule (250 mg total) by mouth 2 (two) times daily as needed for constipation. 30 capsule 4    Prenatal Vit-Fe Fumarate-FA (PRENATAL VITAMIN PO) Take by mouth.        Review of Systems   All systems reviewed and negative except as stated in HPI  Blood pressure 106/61, pulse 97, temperature 98.3 F (36.8 C), temperature source Oral, resp. rate 20, height '5\' 4"'  (1.626 m), weight 91.8 kg, last menstrual period 07/18/2019, SpO2 100 %. General appearance: alert, cooperative and no distress Lungs: clear to auscultation bilaterally Heart: regular rate and rhythm Abdomen: soft, non-tender; bowel sounds normal Extremities: no sign of DVT Presentation:  cephalic and breech Fetal monitoringBaseline: 140 bpm, Variability: Good {> 6 bpm), Accelerations: Reactive and Decelerations: Absent. Late decels were present in MAU Uterine activityFrequency: Every 3 minutes Dilation: 5 Effacement (%): 80 Station: -2 Exam by:: Cloretta Ned, RN    FAMILY TREE  LAB RESULTS  Language English/Spanish Pap <21  Initiated care at 13wk GC/CT Initial: -/-           36wks:-/-  Dating by LMP c/w 6wk U/S    Support person  Genetics NT/IT: neg    Panorama:neg Female     Carrier Screen +CF carrier  Flu vaccine 11/29 Gosper/Hgb Elec Neg  TDaP vaccine 11/23    Rhogam n/a Blood Type O/Positive/-- (07/28 1444)    Antibody Negative (07/28 1444)  Anatomy US Normal female 'Angel' HBsAg Negative (07/28 1444)  Feeding Plan breast RPR Non Reactive (07/28 1444)  Contraception nexplanon in hosp Rubella  1.30 (07/28 1444)  Circumcision '@WCC'  HIV Non Reactive (07/28 1444)  Pediatrician [facility] Hep C neg  Prenatal Classes discussed      A1C/GTT Early: 5.3     26-28wks: normal  BTL Consent n/a    VBAC Consent n/a GBS     POS   '[ ]'  PCN allergy  Waterbirth '[ ]' Class '[ ]' Consent '[ ]' CNM visit       Prenatal labs: ABO, Rh: --/--/PENDING (02/07 2130) Antibody: PENDING (02/07 2130) Rubella: 1.30 (07/28 1444) RPR: Non Reactive (10/29 0919)  HBsAg: Negative (07/28 1444)  HIV: Non Reactive (10/29 0919)  GBS: Positive/-- (01/13 1356)  Genetic screening:  Normal  Anatomy US: normal   Prenatal Transfer Tool  Maternal Diabetes: No Genetic Screening: Normal Maternal Ultrasounds/Referrals: Normal Fetal Ultrasounds or other Referrals:  None Maternal Substance Abuse:  No  Significant Maternal Medications:  None Significant Maternal Lab Results: Group B Strep positive  Results for orders placed or performed during the hospital encounter of 04/25/20 (from the past 24 hour(s))  CBC   Collection Time: 04/25/20  9:11 PM  Result Value Ref Range   WBC 10.3 4.0 - 10.5 K/uL   RBC 4.55 3.87 -  5.11 MIL/uL   Hemoglobin 12.3 12.0 - 15.0 g/dL   HCT 36.9 36.0 - 46.0 %   MCV 81.1 80.0 - 100.0 fL   MCH 27.0 26.0 - 34.0 pg   MCHC 33.3 30.0 - 36.0 g/dL   RDW 14.7 11.5 - 15.5 %   Platelets 217 150 - 400 K/uL   nRBC 0.0 0.0 - 0.2 %  Type and screen   Collection Time:  04/25/20  9:30 PM  Result Value Ref Range   ABO/RH(D) PENDING    Antibody Screen PENDING    Sample Expiration      04/28/2020,2359 Performed at [facility], [place] [address] [place], [place] [address]     Patient Active Problem List   Diagnosis Date Noted   Positive testing for group B Streptococcus 04/03/2020   Cystic fibrosis carrier 11/02/2019   Supervision of normal first pregnancy 10/21/2019   Acne vulgaris 11/23/2015    Assessment/Plan:  Shaquitta Burbridge is a 20 y.o. G1P0 at 16w2dhere for labor.   #Labor: Progressing well. SVE 5cm in MAU. Feels uncomfortable with contractions. Expectant management  #Pain: Analgesics prn. Does not desire epidural at this time #FWB: Cat 1 strip currently. Did have some late decelerations in MAU. Will monitor  #ID: GBS pos, tx with PCN  #MOF: breast #MOC: nexplanon PP #Circ:  Yes  #cystic fibrosis carrier   VWare Placefor [employer] Company CHarbor2/09/2020, 10:10 PM   I personally saw and evaluated the patient, performing the key elements of the service. I developed and verified the management plan that is described in the resident's/student's note, and I agree with the content with my edits above. VSS, HRR&R, Resp unlabored, Legs neg.  FNigel Berthold CNM 04/26/2020 1:50 AM

## 2020-04-25 NOTE — Discharge Instructions (Signed)
First Stage of Labor Labor is your body's natural process of moving your baby and other structures, including the placenta and umbilical cord, out of your uterus. There are three stages of labor. How long each stage lasts is different for every woman. But certain events happen during each stage that are the same for everyone.  The first stage starts when true labor begins. This stage ends when your cervix, which is the opening from your uterus into your vagina, is completely open (dilated).  The second stage begins when your cervix is fully dilated and you start pushing. This stage ends when your baby is born.  The third stage is the delivery of the organ that nourished your baby during pregnancy (placenta). First stage of labor As your due date gets closer, you may start to notice certain physical changes that mean labor is going to start soon. You may feel that your baby has dropped lower into your pelvis. You may experience irregular, often painless, contractions that go away when you walk around or lie down ([employer] contractions). This is also called false labor. The first stage of labor begins when you start having contractions that come at regular (evenly spaced) intervals and your cervix starts to get thinner and wider in preparation for your baby to pass through. Birth care providers measure the dilation of your cervix in centimeters (cm). One centimeter is a little less than one-half of an inch. The first stage ends when your cervix is dilated to 10 cm. The first stage of labor is divided into three phases:  Early phase.  Active phase.  Transitional phase. The length of the first stage of labor varies. It may be longer if this is your first pregnancy. You may spend most of this stage at home trying to relax and stay comfortable. How does this affect me? During the first stage of labor, you will move through three phases. What happens in the early phase?  You will start to  have regular contractions that last 30-60 seconds. Contractions may come every 5-20 minutes. Keep track of your contractions and call your birth care provider.  Your water may break during this phase.  You may notice a clear or slightly bloody discharge of mucus (mucus plug) from your vagina.  Your cervix will dilate to 3-6 cm. What happens in the active phase? The active phase usually lasts 3-5 hours. You may go to the hospital or birth center around this time. During the active phase:  Your contractions will become stronger, longer, and more uncomfortable.  Your contractions may last 45-90 seconds and come every 3-5 minutes.  You may feel lower back pain.  Your birth care providers may examine your cervix and feel your belly to find the position of your baby.  You may have a monitor strapped to your belly to measure your contractions and your baby's heart rate.  You may start using your pain management options.  Your cervix may be dilated to 6 cm and may start to dilate more quickly. What happens in the transitional phase? The transitional phase typically lasts from 30 minutes to 2 hours. At the end of this phase, your cervix will be fully dilated to 10 cm. During the transitional phase:  Contractions will get stronger and longer.  Contractions may last 60-90 seconds and come less than 2 minutes apart.  You may feel hot flashes, chills, or nausea. How does this affect my baby? During the first stage of labor, your baby  will gradually move down into your birth canal. Follow these instructions at home and in the hospital or birth center:  When labor first begins, try to stay calm. You are still in the early phase. If it is night, try to get some sleep. If it is day, try to relax and save your energy. You may want to make some calls and get ready to go to the hospital or birth center.  When you are in the early phase, try these methods to help ease discomfort: ? Deep breathing  and muscle relaxation. ? Taking a walk. ? Taking a warm bath or shower.  Drink some fluids and have a light snack if you feel like it.  Keep track of your contractions.  Based on the plan you created with your birth care provider, call when your contractions indicate it is time.  If your water breaks, note the time, color, and odor of the fluid.  When you are in the active phase, do your breathing exercises and rely on your support people and your team of birth care providers.   Contact a health care provider if:  Your contractions are strong and regular.  You have lower back pain or cramping.  Your water breaks.  You lose your mucus plug. Get help right away if you:  Have a severe headache that does not go away.  Have changes in your vision.  Have severe pain in your upper belly.  Do not feel the baby move.  Have bright red bleeding. Summary  The first stage of labor starts when true labor begins, and it ends when your cervix is dilated to 10 cm.  The first stage of labor has three phases: early, active, and transitional.  Your baby moves into the birth canal during the first stage of labor.  You may have contractions that become stronger and longer. You may also lose your mucus plug and have your water break.  Call your birth care provider when your contractions are frequent and strong enough to go to the hospital or birth center. This information is not intended to replace advice given to you by your health care provider. Make sure you discuss any questions you have with your health care provider. Document Revised: 06/26/2018 Document Reviewed: 05/19/2017 Elsevier Patient Education  2021 Elsevier Inc. Rosen's Emergency Medicine: Concepts and Clinical Practice (9th ed., pp. 2296- 2312). Elsevier.">  Braxton Hicks Contractions Contractions of the uterus can occur throughout pregnancy, but they are not always a sign that you are in labor. You may have practice  contractions called Braxton Hicks contractions. These false labor contractions are sometimes confused with true labor. What are Deberah Pelton contractions? Braxton Hicks contractions are tightening movements that occur in the muscles of the uterus before labor. Unlike true labor contractions, these contractions do not result in opening (dilation) and thinning of the cervix. Toward the end of pregnancy (32-34 weeks), Braxton Hicks contractions can happen more often and may become stronger. These contractions are sometimes difficult to tell apart from true labor because they can be very uncomfortable. You should not feel embarrassed if you go to the hospital with false labor. Sometimes, the only way to tell if you are in true labor is for your health care provider to look for changes in the cervix. The health care provider will do a physical exam and may monitor your contractions. If you are not in true labor, the exam should show that your cervix is not dilating and your water  has not broken. If there are no other health problems associated with your pregnancy, it is completely safe for you to be sent home with false labor. You may continue to have Braxton Hicks contractions until you go into true labor. How to tell the difference between true labor and false labor True labor  Contractions last 30-70 seconds.  Contractions become very regular.  Discomfort is usually felt in the top of the uterus, and it spreads to the lower abdomen and low back.  Contractions do not go away with walking.  Contractions usually become more intense and increase in frequency.  The cervix dilates and gets thinner. False labor  Contractions are usually shorter and not as strong as true labor contractions.  Contractions are usually irregular.  Contractions are often felt in the front of the lower abdomen and in the groin.  Contractions may go away when you walk around or change positions while lying  down.  Contractions get weaker and are shorter-lasting as time goes on.  The cervix usually does not dilate or become thin. Follow these instructions at home:  Take over-the-counter and prescription medicines only as told by your health care provider.  Keep up with your usual exercises and follow other instructions from your health care provider.  Eat and drink lightly if you think you are going into labor.  If Braxton Hicks contractions are making you uncomfortable: ? Change your position from lying down or resting to walking, or change from walking to resting. ? Sit and rest in a tub of warm water. ? Drink enough fluid to keep your urine pale yellow. Dehydration may cause these contractions. ? Do slow and deep breathing several times an hour.  Keep all follow-up prenatal visits as told by your health care provider. This is important.   Contact a health care provider if:  You have a fever.  You have continuous pain in your abdomen. Get help right away if:  Your contractions become stronger, more regular, and closer together.  You have fluid leaking or gushing from your vagina.  You pass blood-tinged mucus (bloody show).  You have bleeding from your vagina.  You have low back pain that you never had before.  You feel your babys head pushing down and causing pelvic pressure.  Your baby is not moving inside you as much as it used to. Summary  Contractions that occur before labor are called Braxton Hicks contractions, false labor, or practice contractions.  Braxton Hicks contractions are usually shorter, weaker, farther apart, and less regular than true labor contractions. True labor contractions usually become progressively stronger and regular, and they become more frequent.  Manage discomfort from [facility] contractions by changing position, resting in a warm bath, drinking plenty of water, or practicing deep breathing. This information is not intended to replace  advice given to you by your health care provider. Make sure you discuss any questions you have with your health care provider. Document Revised: 02/15/2017 Document Reviewed: 07/19/2016 Elsevier Patient Education  2021 [employer].

## 2020-04-25 NOTE — Anesthesia Preprocedure Evaluation (Deleted)
Anesthesia Evaluation    Reviewed: Allergy & Precautions, Patient's Chart, lab work & pertinent test results  Airway        Dental   Pulmonary asthma ,           Cardiovascular negative cardio ROS       Neuro/Psych negative neurological ROS  negative psych ROS   GI/Hepatic negative GI ROS, Neg liver ROS,   Endo/Other  negative endocrine ROS  Renal/GU negative Renal ROS     Musculoskeletal   Abdominal (+) + obese,   Peds  Hematology Lab Results      Component                Value               Date                      WBC                      8.8                 03/02/2020                HGB                      10.9 (L)            03/02/2020                HCT                      31.6 (L)            03/02/2020                MCV                      84.5                03/02/2020                PLT                      179                 03/02/2020              Anesthesia Other Findings   Reproductive/Obstetrics (+) Pregnancy                             Anesthesia Physical Anesthesia Plan  ASA:   Anesthesia Plan: Epidural   Post-op Pain Management:    Induction:   PONV Risk Score and Plan:   Airway Management Planned:   Additional Equipment:   Intra-op Plan:   Post-operative Plan:   Informed Consent:   Plan Discussed with:   Anesthesia Plan Comments: (40.2 wk G1P0 for LEA)        Anesthesia Quick Evaluation

## 2020-04-25 NOTE — Patient Instructions (Signed)
Nichole [name], I greatly value your feedback.  If you receive a survey following your visit with [name] today, we appreciate you taking the time to fill it out.  Thanks, [name], CNM, WHNP-BC  Your induction is scheduled for Sat 2/12. Please DO NOT show up at the time you see in MyChart. Someone from Labor & Delivery will call you on the date of your induction to let you know what time to come in. Please keep your phone on and with you at all times, you have 1 hour to respond to them to let them know you are on your way.  Go to the main desk at the [facility] and let them know you are there to be induced. They will send someone from Labor & Delivery to come get you.  You will get a call from a nurse from the hospital within the next day or so to go over some information, she will also schedule you to go to the hospital a few days before you induction to have your Covid test. If you have any questions, please let us know.    [facility] ([address] [place], [place] [address]) Entrance C, located off of [address] valet parking   Go to [email or web address] to register for FREE online childbirth classes    Call the office [phone]) or go to [facility] if:  You begin to have strong, frequent contractions  Your water breaks.  Sometimes it is a big gush of fluid, sometimes it is just a trickle that keeps getting your panties wet or running down your legs  You have vaginal bleeding.  It is normal to have a small amount of spotting if your cervix was checked.   You don't feel your baby moving like normal.  If you don't, get you something to eat and drink and lay down and focus on feeling your baby move.  You should feel at least 10 movements in 2 hours.  If you don't, you should call the office or go to [facility].   Call the office [phone]) or go to [facility] hospital for these signs of pre-eclampsia:  Severe headache  that does not go away with Tylenol  Visual changes- seeing spots, double, blurred vision  Pain under your right breast or upper abdomen that does not go away with Tums or heartburn medicine  Nausea and/or vomiting  Severe swelling in your hands, feet, and face    Home Blood Pressure Monitoring for Patients   Your provider has recommended that you check your blood pressure (BP) at least once a week at home. If you do not have a blood pressure cuff at home, one will be provided for you. Contact your provider if you have not received your monitor within 1 week.   Helpful Tips for Accurate Home Blood Pressure Checks  . Don't smoke, exercise, or drink caffeine 30 minutes before checking your BP . Use the restroom before checking your BP (a full bladder can raise your pressure) . Relax in a comfortable upright chair . Feet on the ground . Left arm resting comfortably on a flat surface at the level of your heart . Legs uncrossed . Back supported . Sit quietly and don't talk . Place the cuff on your bare arm . Adjust snuggly, so that only two fingertips can fit between your skin and the top of the cuff . Check 2 readings separated by  at least one minute . Keep a log of your BP readings . For a visual, please reference this diagram: http://ccnc.care/bpdiagram  Provider Name: [facility]     Phone: [phone]  Zone 1: ALL CLEAR  Continue to monitor your symptoms:  . BP reading is less than 140 (top number) or less than 90 (bottom number)  . No right upper stomach pain . No headaches or seeing spots . No feeling nauseated or throwing up . No swelling in face and hands  Zone 2: CAUTION Call your doctor's office for any of the following:  . BP reading is greater than 140 (top number) or greater than 90 (bottom number)  . Stomach pain under your ribs in the middle or right side . Headaches or seeing spots . Feeling nauseated or throwing up . Swelling in face and hands  Zone  3: EMERGENCY  Seek immediate medical care if you have any of the following:  . BP reading is greater than160 (top number) or greater than 110 (bottom number) . Severe headaches not improving with Tylenol . Serious difficulty catching your breath . Any worsening symptoms from Zone 2   Braxton Hicks Contractions Contractions of the uterus can occur throughout pregnancy, but they are not always a sign that you are in labor. You may have practice contractions called Braxton Hicks contractions. These false labor contractions are sometimes confused with true labor. What are [place] contractions? Braxton Hicks contractions are tightening movements that occur in the muscles of the uterus before labor. Unlike true labor contractions, these contractions do not result in opening (dilation) and thinning of the cervix. Toward the end of pregnancy (32-34 weeks), Braxton Hicks contractions can happen more often and may become stronger. These contractions are sometimes difficult to tell apart from true labor because they can be very uncomfortable. You should not feel embarrassed if you go to the hospital with false labor. Sometimes, the only way to tell if you are in true labor is for your health care provider to look for changes in the cervix. The health care provider will do a physical exam and may monitor your contractions. If you are not in true labor, the exam should show that your cervix is not dilating and your water has not broken. If there are no other health problems associated with your pregnancy, it is completely safe for you to be sent home with false labor. You may continue to have Braxton Hicks contractions until you go into true labor. How to tell the difference between true labor and false labor True labor  Contractions last 30-70 seconds.  Contractions become very regular.  Discomfort is usually felt in the top of the uterus, and it spreads to the lower abdomen and low  back.  Contractions do not go away with walking.  Contractions usually become more intense and increase in frequency.  The cervix dilates and gets thinner. False labor  Contractions are usually shorter and not as strong as true labor contractions.  Contractions are usually irregular.  Contractions are often felt in the front of the lower abdomen and in the groin.  Contractions may go away when you walk around or change positions while lying down.  Contractions get weaker and are shorter-lasting as time goes on.  The cervix usually does not dilate or become thin. Follow these instructions at home:  1. Take over-the-counter and prescription medicines only as told by your health care provider. 2. Keep up with your usual exercises and follow  other instructions from your health care provider. 3. Eat and drink lightly if you think you are going into labor. 4. If Braxton Hicks contractions are making you uncomfortable: ? Change your position from lying down or resting to walking, or change from walking to resting. ? Sit and rest in a tub of warm water. ? Drink enough fluid to keep your urine pale yellow. Dehydration may cause these contractions. ? Do slow and deep breathing several times an hour. 5. Keep all follow-up prenatal visits as told by your health care provider. This is important. Contact a health care provider if:  You have a fever.  You have continuous pain in your abdomen. Get help right away if:  Your contractions become stronger, more regular, and closer together.  You have fluid leaking or gushing from your vagina.  You pass blood-tinged mucus (bloody show).  You have bleeding from your vagina.  You have low back pain that you never had before.  You feel your baby's head pushing down and causing pelvic pressure.  Your baby is not moving inside you as much as it used to. Summary  Contractions that occur before labor are called Braxton Hicks contractions,  false labor, or practice contractions.  Braxton Hicks contractions are usually shorter, weaker, farther apart, and less regular than true labor contractions. True labor contractions usually become progressively stronger and regular, and they become more frequent.  Manage discomfort from [facility] contractions by changing position, resting in a warm bath, drinking plenty of water, or practicing deep breathing. This information is not intended to replace advice given to you by your health care provider. Make sure you discuss any questions you have with your health care provider. Document Revised: 02/15/2017 Document Reviewed: 07/19/2016 Elsevier Patient Education  [place].

## 2020-04-26 ENCOUNTER — Encounter (HOSPITAL_COMMUNITY): Payer: Self-pay | Admitting: Obstetrics & Gynecology

## 2020-04-26 DIAGNOSIS — Z3A40 40 weeks gestation of pregnancy: Secondary | ICD-10-CM

## 2020-04-26 DIAGNOSIS — O480 Post-term pregnancy: Secondary | ICD-10-CM | POA: Diagnosis not present

## 2020-04-26 DIAGNOSIS — O99820 Streptococcus B carrier state complicating pregnancy: Secondary | ICD-10-CM | POA: Diagnosis not present

## 2020-04-26 DIAGNOSIS — O326XX0 Maternal care for compound presentation, not applicable or unspecified: Secondary | ICD-10-CM

## 2020-04-26 LAB — CBC
HCT: 30.4 % — ABNORMAL LOW (ref 36.0–46.0)
HCT: 27.7 % — ABNORMAL LOW (ref 36.0–46.0)
Hemoglobin: 9.7 g/dL — ABNORMAL LOW (ref 12.0–15.0)
Hemoglobin: 8.8 g/dL — ABNORMAL LOW (ref 12.0–15.0)
MCH: 26.4 pg (ref 26.0–34.0)
MCH: 26.3 pg (ref 26.0–34.0)
MCHC: 31.9 g/dL (ref 30.0–36.0)
MCHC: 31.8 g/dL (ref 30.0–36.0)
MCV: 82.6 fL (ref 80.0–100.0)
MCV: 82.9 fL (ref 80.0–100.0)
Platelets: 187 10*3/uL (ref 150–400)
Platelets: 200 10*3/uL (ref 150–400)
RBC: 3.68 MIL/uL — ABNORMAL LOW (ref 3.87–5.11)
RBC: 3.34 MIL/uL — ABNORMAL LOW (ref 3.87–5.11)
RDW: 14.5 % (ref 11.5–15.5)
RDW: 14.6 % (ref 11.5–15.5)
WBC: 16.2 10*3/uL — ABNORMAL HIGH (ref 4.0–10.5)
WBC: 17.2 10*3/uL — ABNORMAL HIGH (ref 4.0–10.5)
nRBC: 0 % (ref 0.0–0.2)
nRBC: 0 % (ref 0.0–0.2)

## 2020-04-26 LAB — RPR: RPR Ser Ql: NONREACTIVE

## 2020-04-26 MED ORDER — LACTATED RINGERS IV SOLN: INTRAVENOUS | Status: DC

## 2020-04-26 MED ORDER — TETANUS-DIPHTH-ACELL PERTUSSIS 5-2.5-18.5 LF-MCG/0.5 IM SUSY: 0.5000 mL | PREFILLED_SYRINGE | Freq: Once | INTRAMUSCULAR | Status: DC

## 2020-04-26 MED ORDER — SENNOSIDES-DOCUSATE SODIUM 8.6-50 MG PO TABS
2.0000 | ORAL_TABLET | Freq: Every day | ORAL | Status: DC
  Administered 2020-04-27 – 2020-04-28 (×2): 2 via ORAL
  Filled 2020-04-26 (×2): qty 2

## 2020-04-26 MED ORDER — BENZOCAINE-MENTHOL 20-0.5 % EX AERO
1.0000 | INHALATION_SPRAY | CUTANEOUS | Status: DC | PRN
Start: 2020-04-26 — End: 2020-04-28
  Administered 2020-04-26: 1 via TOPICAL
  Filled 2020-04-26 (×2): qty 56

## 2020-04-26 MED ORDER — ONDANSETRON HCL 4 MG PO TABS
4.0000 mg | ORAL_TABLET | ORAL | Status: DC | PRN
  Administered 2020-04-26: 4 mg via ORAL
  Filled 2020-04-26: qty 1

## 2020-04-26 MED ORDER — LACTATED RINGERS IV BOLUS: 1000.0000 mL | Freq: Once | INTRAVENOUS | Status: DC

## 2020-04-26 MED ORDER — WITCH HAZEL-GLYCERIN EX PADS
1.0000 | MEDICATED_PAD | CUTANEOUS | Status: DC | PRN
Start: 2020-04-26 — End: 2020-04-28

## 2020-04-26 MED ORDER — ONDANSETRON HCL 4 MG/2ML IJ SOLN: 4.0000 mg | INTRAMUSCULAR | Status: DC | PRN

## 2020-04-26 MED ORDER — DIPHENHYDRAMINE HCL 25 MG PO CAPS: 25.0000 mg | ORAL_CAPSULE | Freq: Four times a day (QID) | ORAL | Status: DC | PRN

## 2020-04-26 MED ORDER — TRANEXAMIC ACID-NACL 1000-0.7 MG/100ML-% IV SOLN
1000.0000 mg | INTRAVENOUS | Status: AC
  Administered 2020-04-26: 1000 mg via INTRAVENOUS

## 2020-04-26 MED ORDER — PRENATAL MULTIVITAMIN CH
1.0000 | ORAL_TABLET | Freq: Every day | ORAL | Status: DC
  Administered 2020-04-26 – 2020-04-28 (×3): 1 via ORAL
  Filled 2020-04-26 (×3): qty 1

## 2020-04-26 MED ORDER — TRANEXAMIC ACID-NACL 1000-0.7 MG/100ML-% IV SOLN
INTRAVENOUS | Status: AC
  Filled 2020-04-26: qty 100

## 2020-04-26 MED ORDER — MISOPROSTOL 200 MCG PO TABS
ORAL_TABLET | ORAL | Status: AC
  Administered 2020-04-26: 800 ug
  Filled 2020-04-26: qty 4

## 2020-04-26 MED ORDER — FERROUS SULFATE 325 (65 FE) MG PO TABS
325.0000 mg | ORAL_TABLET | ORAL | Status: DC
  Administered 2020-04-26 – 2020-04-28 (×2): 325 mg via ORAL
  Filled 2020-04-26 (×2): qty 1

## 2020-04-26 MED ORDER — FENTANYL CITRATE (PF) 100 MCG/2ML IJ SOLN
INTRAMUSCULAR | Status: AC
  Filled 2020-04-26: qty 2

## 2020-04-26 MED ORDER — COCONUT OIL OIL: 1.0000 "application " | TOPICAL_OIL | Status: DC | PRN

## 2020-04-26 MED ORDER — ACETAMINOPHEN 325 MG PO TABS
650.0000 mg | ORAL_TABLET | ORAL | Status: DC | PRN
  Administered 2020-04-27: 650 mg via ORAL
  Filled 2020-04-26: qty 2

## 2020-04-26 MED ORDER — SIMETHICONE 80 MG PO CHEW: 80.0000 mg | CHEWABLE_TABLET | ORAL | Status: DC | PRN

## 2020-04-26 MED ORDER — IBUPROFEN 600 MG PO TABS
600.0000 mg | ORAL_TABLET | Freq: Four times a day (QID) | ORAL | Status: DC
  Administered 2020-04-26 – 2020-04-28 (×9): 600 mg via ORAL
  Filled 2020-04-26 (×9): qty 1

## 2020-04-26 MED ORDER — VITAMIN C 250 MG PO TABS
250.0000 mg | ORAL_TABLET | ORAL | Status: DC
  Administered 2020-04-26: 250 mg via ORAL
  Filled 2020-04-26 (×4): qty 1

## 2020-04-26 MED ORDER — MISOPROSTOL 200 MCG PO TABS: 800.0000 ug | ORAL_TABLET | Freq: Once | ORAL | Status: DC

## 2020-04-26 MED ORDER — METHYLERGONOVINE MALEATE 0.2 MG/ML IJ SOLN
INTRAMUSCULAR | Status: AC
  Filled 2020-04-26: qty 1

## 2020-04-26 MED ORDER — DIBUCAINE (PERIANAL) 1 % EX OINT: 1.0000 "application " | TOPICAL_OINTMENT | CUTANEOUS | Status: DC | PRN

## 2020-04-26 NOTE — Lactation Note (Signed)
This note was copied from a baby's chart. Lactation Consultation Note  Patient Name: Nichole [name] Date: 04/26/2020 Reason for consult: L&D Initial assessment;Other (Comment);Term;1st time breastfeeding;Primapara (LC  visit started prior to 60 mins . LD RN reported mom had PPH. stable now and it was ok to visit mom and baby. baby STS with mom, and rooting, LC offered to assist and mom receptive. baby sluggish at 1st and then latched and fed 12 mins.) P1  Age:72 hours  Maternal Data Has patient been taught Hand Expression?: Yes (big drop) Does the patient have breastfeeding experience prior to this delivery?: No  Feeding Mother's Current Feeding Choice: Breast Milk  LATCH Score Latch: Repeated attempts needed to sustain latch, nipple held in mouth throughout feeding, stimulation needed to elicit sucking reflex.  Audible Swallowing: Spontaneous and intermittent  Type of Nipple: Everted at rest and after stimulation  Comfort (Breast/Nipple): Soft / non-tender  Hold (Positioning): Assistance needed to correctly position infant at breast and maintain latch.  LATCH Score: 8   Lactation Tools Discussed/Used    Interventions Interventions: Breast feeding basics reviewed;Assisted with latch;Skin to skin;Breast massage;Hand express;Reverse pressure;Breast compression;Adjust position  Discharge    Consult Status Consult Status: Follow-up Date: 04/26/20 Follow-up type: In-patient    Nichole [name] 04/26/2020, 8:32 AM

## 2020-04-26 NOTE — Discharge Summary (Addendum)
Postpartum Discharge Summary     Patient Name: Nichole [name] DOB: 2000/06/16 MRN: [id number]  Date of admission: 04/25/2020 Delivery date:04/26/2020  Delivering provider: Shary Key  Date of discharge: 04/28/2020  Admitting diagnosis: Post-dates pregnancy [O48.0] Intrauterine pregnancy: [redacted]w[redacted]d    Secondary diagnosis:  Active Problems:   Post-dates pregnancy  Additional problems: postpartum hemorrhage    Discharge diagnosis: Term Pregnancy Delivered and Anemia                                              Post partum procedures:IV iron Augmentation: AROM Complications: [id number] Hospital course: Onset of Labor With Vaginal Delivery      20y.o. yo G1P0 at 48w3das admitted in Active Labor on 04/25/2020. Patient had a labor/delivery course complicated by PPH of [id number] note).  Membrane Rupture Time/Date: 5:36 AM ,04/26/2020   Delivery Method:Vaginal, Spontaneous  Episiotomy: None  Lacerations:  None  Patient had a postpartum course remarkable for receiving IV Ven[facility]er due to [facility] She is ambulating, tolerating a regular diet, passing flatus, and urinating well. Patient is discharged home in stable condition on 04/28/20.  Newborn Data: Birth date:04/26/2020  Birth time:7:18 AM  Gender:Female  Living status:Living  Apgars:9 ,9  Weight:2954 g (6lb 8.2oz)  Magnesium Sulfate received: No BMZ received: No Rhophylac:N/A MMR:N/A T-DaP:Given prenatally Flu: Yes Transfusion:No (IV Venofer)  Physical exam  Vitals:   04/26/20 0741 04/26/20 0745 04/26/20 0747 04/26/20 0802  BP:   124/78 120/74  Pulse:  (!) 121  (!) 106  Resp:      Temp:      TempSrc:      SpO2: 99%     Weight:      Height:       General: alert, cooperative and no distress Lochia: appropriate Uterine Fundus: firm Incision: N/A DVT Evaluation: No evidence of DVT seen on physical exam. Labs: Lab Results  Component Value Date   WBC 10.3 04/25/2020   HGB 12.3  04/25/2020   HCT 36.9 04/25/2020   MCV 81.1 04/25/2020   PLT 217 04/25/2020   CMP Latest Ref Rng & Units 03/02/2020  Glucose 70 - 99 mg/dL 92  BUN 6 - 20 mg/dL <5(L)  Creatinine 0.44 - 1.00 mg/dL 0.50  Sodium 135 - 145 mmol/L 137  Potassium 3.5 - 5.1 mmol/L 3.6  Chloride 98 - 111 mmol/L 104  CO2 22 - 32 mmol/L 23  Calcium 8.9 - 10.3 mg/dL 9.1  Total Protein 6.5 - 8.1 g/dL 6.3(L)  Total Bilirubin 0.3 - 1.2 mg/dL 0.4  Alkaline Phos 38 - 126 U/L 86  AST 15 - 41 U/L 18  ALT 0 - 44 U/L 13   Edinburgh Score: No flowsheet data found.   After visit meds:  Allergies as of 04/28/2020   No Known Allergies     Medication List    TAKE these medications   acetaminophen 325 MG tablet Commonly known as: Tylenol Take 2 tablets (650 mg total) by mouth every 6 (six) hours as needed for mild pain or fever.   albuterol 108 (90 Base) MCG/ACT inhaler Commonly known as: VENTOLIN HFA Inhale 2 puffs into the lungs every 6 (six) hours as needed for wheezing or shortness of breath.   Blood Pressure Monitor Misc For regular home bp monitoring during pregnancy  docusate sodium 250 MG capsule Commonly known as: COLACE Take 1 capsule (250 mg total) by mouth 2 (two) times daily as needed for constipation.   ferrous sulfate 325 (65 FE) MG tablet Take 1 tablet (325 mg total) by mouth every other day. Start taking on: April 30, 2020   ibuprofen 600 MG tablet Commonly known as: ADVIL Take 1 tablet (600 mg total) by mouth every 6 (six) hours.   PRENATAL VITAMIN PO Take by mouth.        Discharge home in stable condition Infant Feeding: Breast Infant Disposition:home with mother Discharge instruction: per After Visit Summary and Postpartum booklet. Activity: Advance as tolerated. Pelvic rest for 6 weeks.  Diet: routine diet Future Appointments: Future Appointments  Date Time Provider [place]  06/07/2020  4:10 PM Roma Schanz, CNM [facility]   Follow up  Visit:   Please schedule this patient for a virtual postpartum visit in 4 weeks with the following provider: Any provider. Additional Postpartum F/U:  Low risk pregnancy complicated by: PPH hemorrhage and associated anemia, received IV iron and discharged on PO iron Delivery mode:  Vaginal, Spontaneous  Anticipated Birth Control:  PP Nexplanon placed   04/28/2020 EMILY Madelin Headings, MD   CNM attestation I have seen and examined this patient and agree with above documentation in the resident's note.   Nichole [name] is a 20 y.o. G1P1001 s/p vag del.   Pain is well controlled.  Plan for birth control is Nexplanon placed IP.  Method of Feeding: breast Denies dizziness, s/s of anemia, or difficulty with ambulation. S/p IV Venofer on PPD#1.  PE:  BP (!) 100/57 (BP Location: Right Arm)   Pulse 87   Temp 97.7 F (36.5 C) (Oral)   Resp 18   Ht '5\' 4"'  (1.626 m)   Wt 91.8 kg   LMP 07/18/2019   SpO2 100%   Breastfeeding Unknown   BMI 34.74 kg/m  Fundus firm  Recent Labs    04/26/20 1416 04/27/20 0504  HGB 8.8* 7.8*  HCT 27.7* 23.4*     Plan: discharge today - postpartum care discussed - f/u clinic in 4 weeks for postpartum visit   Myrtis Ser, CNM 9:56 AM

## 2020-04-26 NOTE — Lactation Note (Signed)
This note was copied from a baby's chart. Lactation Consultation Note  Patient Name: Nichole [name] Date: 04/26/2020 Reason for consult: Follow-up assessment;Primapara;1st time breastfeeding;Term Age:20 hours  Mom sitting in bed holding swaddled baby attempting to breastfeed, maternal grandmother and husband at bedside. Mom reports difficulty latching baby to breast. Mom's preference is to breast and formula feed, states baby received formula prior to [facility] entering room. Mom reports with a DEBP at home, mom's goal is to breastfeed as long as baby desires.  LC placed baby skin to skin and latched to right breast cross cradle, wide angle, audible swallows and breast tissue movement noted, baby released breast on own ~32min, nipple round on release. Mom latched baby to left breast on own. Advised feed on cue, 8-12 feedings every 24hrs, wake if >3hrs since last feeding, skin to skin with feedings, hand express and offer colostrum after feedings, offer breast first then formula if desires, call for [facility] support if needed before next visit. Mom voiced understanding and with no further concerns. Left the room with baby still latched to left breast ~3min mark. BGilliam, RN, IBCLC  Maternal Data Has patient been taught Hand Expression?: Yes Does the patient have breastfeeding experience prior to this delivery?: No  Feeding Mother's Current Feeding Choice: Breast Milk and Formula Nipple Type: Slow - flow  LATCH Score Latch: Grasps breast easily, tongue down, lips flanged, rhythmical sucking.  Audible Swallowing: A few with stimulation  Type of Nipple: Everted at rest and after stimulation  Comfort (Breast/Nipple): Soft / non-tender  Hold (Positioning): Assistance needed to correctly position infant at breast and maintain latch.  LATCH Score: 8   Lactation Tools Discussed/Used    Interventions Interventions: Breast feeding basics reviewed;Assisted with latch;Skin to skin;Breast  compression  Discharge WIC Program: No  Consult Status Consult Status: Follow-up Date: 04/27/20 Follow-up type: In-patient    Nichole [name] 04/26/2020, 2:51 PM

## 2020-04-27 ENCOUNTER — Telehealth: Payer: Self-pay | Admitting: Family Medicine

## 2020-04-27 DIAGNOSIS — Z975 Presence of (intrauterine) contraceptive device: Secondary | ICD-10-CM

## 2020-04-27 DIAGNOSIS — Z30017 Encounter for initial prescription of implantable subdermal contraceptive: Secondary | ICD-10-CM | POA: Diagnosis not present

## 2020-04-27 LAB — CBC
HCT: 23.4 % — ABNORMAL LOW (ref 36.0–46.0)
Hemoglobin: 7.8 g/dL — ABNORMAL LOW (ref 12.0–15.0)
MCH: 27.5 pg (ref 26.0–34.0)
MCHC: 33.3 g/dL (ref 30.0–36.0)
MCV: 82.4 fL (ref 80.0–100.0)
Platelets: 164 10*3/uL (ref 150–400)
RBC: 2.84 MIL/uL — ABNORMAL LOW (ref 3.87–5.11)
RDW: 15 % (ref 11.5–15.5)
WBC: 11.2 10*3/uL — ABNORMAL HIGH (ref 4.0–10.5)
nRBC: 0 % (ref 0.0–0.2)

## 2020-04-27 MED ORDER — ETONOGESTREL 68 MG ~~LOC~~ IMPL
68.0000 mg | DRUG_IMPLANT | Freq: Once | SUBCUTANEOUS | Status: AC
  Administered 2020-04-27: 68 mg via SUBCUTANEOUS
  Filled 2020-04-27: qty 1

## 2020-04-27 MED ORDER — LIDOCAINE HCL 1 % IJ SOLN
0.0000 mL | Freq: Once | INTRAMUSCULAR | Status: AC | PRN
  Administered 2020-04-27: 20 mL via INTRADERMAL
  Filled 2020-04-27: qty 20

## 2020-04-27 MED ORDER — SODIUM CHLORIDE 0.9 % IV SOLN
500.0000 mg | Freq: Once | INTRAVENOUS | Status: AC
  Administered 2020-04-27: 500 mg via INTRAVENOUS
  Filled 2020-04-27: qty 25

## 2020-04-27 NOTE — Telephone Encounter (Signed)
Pt had a baby and is requesting that the baby be seen by Dr. Louanne Skye. She is still in this hospital at this time.   Would like to be called at [phone]

## 2020-04-27 NOTE — Procedures (Signed)
INPATIENT CONTRACEPTION PROCEDURE NOTE   Nichole [name] is a 20 y.o. G1P1001 desires inpatient Nexplanon insertion. No complaints. Risks and benefits discussed and all questions answered.  BP 106/63 (BP Location: Left Arm)   Pulse 85   Temp 98.3 F (36.8 C) (Oral)   Resp 16   Ht 5\' 4"  (1.626 m)   Wt 91.8 kg   LMP 07/18/2019   SpO2 100%   Breastfeeding Unknown   BMI 34.74 kg/m    Results for orders placed or performed during the hospital encounter of 04/25/20 (from the past 24 hour(s))  CBC     Status: Abnormal   Collection Time: 04/27/20  5:04 AM  Result Value Ref Range   WBC 11.2 (H) 4.0 - 10.5 K/uL   RBC 2.84 (L) 3.87 - 5.11 MIL/uL   Hemoglobin 7.8 (L) 12.0 - 15.0 g/dL   HCT 06/25/20 (L) 33.2 - 95.1 %   MCV 82.4 80.0 - 100.0 fL   MCH 27.5 26.0 - 34.0 pg   MCHC 33.3 30.0 - 36.0 g/dL   RDW 88.4 16.6 - 06.3 %   Platelets 164 150 - 400 K/uL   nRBC 0.0 0.0 - 0.2 %      Nexplanon Insertion Procedure Patient identified, informed consent performed, consent signed. Patient does understand that irregular bleeding is a very common side effect of this medication. She was advised to have backup contraception for one week after placement. Pregnancy test in clinic today was negative.  Appropriate time out taken. Patient's left arm was prepped and draped in the usual sterile fashion. The ruler used to measure and mark insertion area.  Patient was prepped with alcohol swab and then injected with 3 ml of 1% lidocaine.  She was prepped with betadine, Nexplanon removed from packaging,  Device confirmed in needle, then inserted full length of needle and withdrawn per handbook instructions. Nexplanon was able to palpated in the patient's arm; patient palpated the insert herself. There was minimal blood loss.  Patient insertion site covered with guaze and a pressure bandage to reduce any bruising.  The patient tolerated the procedure well and was given post procedure instructions. Follow-up via My  Chart video visit , unless having problems and need to be seen in the office.  Nexplanon Lot#: [id number] / Expiration Date: 07/29/2022  09/28/2022, CNM  04/27/2020 1:35 PM

## 2020-04-27 NOTE — Telephone Encounter (Signed)
Please advise, she is one of your patients.

## 2020-04-27 NOTE — Telephone Encounter (Signed)
Yes I am good with accepting her baby.

## 2020-04-27 NOTE — Lactation Note (Signed)
This note was copied from a baby's chart. Lactation Consultation Note  Patient Name: Nichole [name] LOVFI'E Date: 04/27/2020 Reason for consult: Follow-up assessment Age:20 hours  Maternal Data Has patient been taught Hand Expression?: Yes Does the patient have breastfeeding experience prior to this delivery?: No P1, term female infant. LC entered the room,  infant was cuing to BF, mom latched infant on her right breast using the football hold position. Infant was still BF after 8 minutes  when LC left the room. Mom will continue to BF infant according to cues, 8 to 12+ times within 24 hours, STS.    Feeding Mother's Current Feeding Choice: Breast Milk  LATCH Score Latch: Grasps breast easily, tongue down, lips flanged, rhythmical sucking.  Audible Swallowing: Spontaneous and intermittent  Type of Nipple: Flat  Comfort (Breast/Nipple): Soft / non-tender  Hold (Positioning): Assistance needed to correctly position infant at breast and maintain latch.  LATCH Score: 8   Lactation Tools Discussed/Used    Interventions Interventions: Skin to skin;Hand express;Breast compression;Adjust position;Support pillows;Position options;Expressed milk  Discharge    Consult Status Consult Status: Follow-up Date: 04/27/20 Follow-up type: In-patient    Danelle Earthly 04/27/2020, 3:13 AM

## 2020-04-27 NOTE — Telephone Encounter (Signed)
Appointment scheduled for new born weight check tomorrow at 9:25 am with Dr. Louanne Skye

## 2020-04-27 NOTE — Lactation Note (Signed)
This note was copied from a baby's chart. Lactation Consultation Note  Patient Name: Nichole [name] [id number] Date: 04/27/2020 Reason for consult: Follow-up assessment Age:20 hours  P1 mother whose infant is now 77 hours old.  This is a term baby at 40+3 weeks.  Mother's feeding preference is breast/bottle.  Baby was asleep in the bassinet when I arrived; mother eating breakfast while receiving IV iron.  Mother had no questions/concerns related to breast feeding.  Mother has been feeding on cue and baby has only had formula supplementation once yesterday afternoon.    Mother will continue feeding 8-12 times/24 hours or sooner if baby shows feeding cues.  She will call for latch assistance as needed. Manual pump provided with instructions for use.  Mother has a DEBP for home use.  Family desires a discharge today.  Mother informed me that the midwife gave permission for discharge, however, the RN is uncertain at this time if family will be discharged.  Mother has made a pediatrician first visit for tomorrow.  RN will follow up as the day progresses.  If it looks like mother will be discharged tomorrow, mother will be notified so she can change baby's appointment.     Maternal Data    Feeding    LATCH Score Latch: Grasps breast easily, tongue down, lips flanged, rhythmical sucking. (significant other was assisting)  Audible Swallowing: Spontaneous and intermittent  Type of Nipple: Everted at rest and after stimulation  Comfort (Breast/Nipple): Soft / non-tender  Hold (Positioning): Assistance needed to correctly position infant at breast and maintain latch. (significant other was assisting)  LATCH Score: 9   Lactation Tools Discussed/Used    Interventions    Discharge    Consult Status Consult Status: Follow-up Date: 04/28/20 Follow-up type: In-patient    Anokhi Shannon R Bryauna Byrum 04/27/2020, 10:27 AM

## 2020-04-27 NOTE — Progress Notes (Signed)
POSTPARTUM PROGRESS NOTE  Subjective: Nichole [name] is a 20 y.o. G1P1001 s/p SVD at [redacted]w[redacted]d.  She reports she doing well. No acute events overnight. She denies any problems with ambulating, voiding or po intake. Denies nausea or vomiting. Pain is moderately controlled.  Lochia is minimal.  Objective: Blood pressure 97/65, pulse 89, temperature 98.1 F (36.7 C), temperature source Oral, resp. rate 15, height 5\' 4"  (1.626 m), weight 91.8 kg, last menstrual period 07/18/2019, SpO2 100 %, unknown if currently breastfeeding.  Physical Exam:  General: alert, cooperative and no distress Chest: no respiratory distress Abdomen: soft, non-tender  Uterine Fundus: firm and at level of umbilicus Extremities: No calf swelling or tenderness  no edema  Recent Labs    04/26/20 1416 04/27/20 0504  HGB 8.8* 7.8*  HCT 27.7* 23.4*    Assessment/Plan: Nichole [name] is a 20 y.o. G1P1001 s/p SVD at [redacted]w[redacted]d for SOL.  Routine Postpartum Care: Doing well, pain well-controlled.  -- Continue routine care, lactation support  -- Contraception: nexplanon -- Feeding: breast  PPH: EBL 1950. Hgb 7.8 down from 8.8 yesterday and pre delivery 12.3. Asymptomatic  - IV Venofer    Dispo: Plan for discharge tomorrow, 04/28/20.  06/26/20, DO 04/27/2020 7:57 AM

## 2020-04-28 MED ORDER — IBUPROFEN 600 MG PO TABS: 600.0000 mg | ORAL_TABLET | Freq: Four times a day (QID) | ORAL | 0 refills | Status: DC

## 2020-04-28 MED ORDER — FERROUS SULFATE 325 (65 FE) MG PO TABS: 325.0000 mg | ORAL_TABLET | ORAL | 0 refills | Status: DC

## 2020-04-28 NOTE — Lactation Note (Signed)
This note was copied from a baby's chart. Lactation Consultation Note  Patient Name: Boy Carleen Rhue [id number] Date: 04/28/2020 Reason for consult: Follow-up assessment;1st time breastfeeding;Term Age:20 hours   LC Heather and LC Student Evangeline Utley Katrinka Blazing entered the room where mother, infant, and father of infant were present. Mother was preparing for discharge. Mother was concerned with needing tape for birth control in her arm. LC Heather advised her that she would let her nurse know of her concerns. Mother stated that infant sometimes wants to latch and sometimes he does not. She stated that she sometimes feels pressure in her breasts so she pumps and it relieves it. LC Jeanelle Dake Katrinka Blazing informed her to continue pumping, hand expressing, or latching when she feels pressure. LC Student Sophy Mesler Katrinka Blazing educated mother about changes in breast, nutrition, cluster feeding, and engorgement. Mother stated she is interested in applying for [facility]. LC Heather told her she would send a referral over. Mother also asked how long can you store milk in the fridge. LC Student Endiya Klahr Katrinka Blazing informed mother about breast milk storage guidelines. Mother or father had no other questions or concerns. Inf[facility] will be [facility] at [facility].   PLAN: 1. Breastfeed on demand 8-12x a day; offer both breasts for feeding 2. Pump, hand express, or latch to prevent engorgement; supplement EBM as needed 3. Monitor pees and poops; follow up with pediatrician for continued care 4. Utilize community breastfeeding resources provided by lactation team    Maternal Data Does the patient have breastfeeding experience prior to this delivery?: No  Feeding Mother's Current Feeding Choice: Breast Milk  Interventions Interventions: Breast feeding basics reviewed;Education  Discharge Discharge Education: Engorgement and breast care;Warning signs for feeding baby;Outpatient recommendation Pump: Personal WIC Program: No  (Interested in applying)  Consult Status Consult Status: Complete    Vanesa Renier Katrinka Blazing 04/28/2020, 11:03 AM

## 2020-04-30 ENCOUNTER — Inpatient Hospital Stay (HOSPITAL_COMMUNITY): Payer: Medicaid Other

## 2020-04-30 ENCOUNTER — Inpatient Hospital Stay (HOSPITAL_COMMUNITY)
Admission: AD | Admit: 2020-04-30 | Payer: Medicaid Other | Source: Home / Self Care | Admitting: Obstetrics and Gynecology

## 2020-05-11 ENCOUNTER — Encounter: Payer: Self-pay | Admitting: Family Medicine

## 2020-05-11 ENCOUNTER — Ambulatory Visit (INDEPENDENT_AMBULATORY_CARE_PROVIDER_SITE_OTHER): Payer: Medicaid Other | Admitting: Family Medicine

## 2020-05-11 ENCOUNTER — Other Ambulatory Visit: Payer: Self-pay

## 2020-05-11 VITALS — BP 117/80 | HR 91 | Temp 96.9°F | Resp 20 | Ht 64.0 in | Wt 177.5 lb

## 2020-05-11 DIAGNOSIS — R300 Dysuria: Secondary | ICD-10-CM | POA: Diagnosis not present

## 2020-05-11 LAB — URINALYSIS, COMPLETE
Bilirubin, UA: NEGATIVE
Glucose, UA: NEGATIVE
Nitrite, UA: POSITIVE — AB
Specific Gravity, UA: 1.025 (ref 1.005–1.030)
Urobilinogen, Ur: 0.2 mg/dL (ref 0.2–1.0)
pH, UA: 5 (ref 5.0–7.5)

## 2020-05-11 LAB — MICROSCOPIC EXAMINATION: Epithelial Cells (non renal): NONE SEEN /hpf (ref 0–10)

## 2020-05-11 MED ORDER — AMOXICILLIN 500 MG PO CAPS: 500.0000 mg | ORAL_CAPSULE | Freq: Three times a day (TID) | ORAL | 0 refills | Status: DC

## 2020-05-11 NOTE — Progress Notes (Signed)
Subjective:  Patient ID: Nichole [name], female    DOB: 05-Mar-2001  Age: 20 y.o. MRN: [id number]  CC: Dysuria, abdominal pain and pressure   HPI Nichole [name] presents for burning with urination and frequency for several days. Denies fever . No flank pain. No nausea, vomiting. Gave birth on 04/26/20. Also having small HA.Breast feeding.   Depression screen [facility] 2/9 05/11/2020 01/15/2020 10/21/2019  Decreased Interest 0 1 0  Down, Depressed, Hopeless 0 0 0  PHQ - 2 Score 0 1 0  Altered sleeping - 1 0  Tired, decreased energy - 1 1  Change in appetite - 0 0  Feeling bad or failure about yourself  - 0 0  Trouble concentrating - 0 1  Moving slowly or fidgety/restless - 0 0  Suicidal thoughts - 0 0  PHQ-9 Score - 3 2    History Nichole [name] has a past medical history of Asthma.   She has a past surgical history that includes No past surgeries.   Her family history includes Diabetes in her father.She reports that she has never smoked. She has never used smokeless tobacco. She reports that she does not drink alcohol and does not use drugs.    ROS Review of Systems  Constitutional: Negative for chills, diaphoresis and fever.  HENT: Negative for congestion.   Eyes: Negative for visual disturbance.  Respiratory: Negative for cough and shortness of breath.   Cardiovascular: Negative for chest pain and palpitations.  Gastrointestinal: Negative for constipation, diarrhea and nausea.  Genitourinary: Positive for dysuria, frequency and urgency. Negative for decreased urine volume, flank pain, hematuria, menstrual problem and pelvic pain.  Musculoskeletal: Negative for arthralgias and joint swelling.  Skin: Negative for rash.  Neurological: Negative for dizziness and numbness.    Objective:  BP 117/80   Pulse 91   Temp (!) 96.9 F (36.1 C) (Temporal)   Resp 20   Ht 5\' 4"  (1.626 m)   Wt 177 lb 8 oz (80.5 kg)   LMP 07/18/2019   SpO2 98%   BMI 30.47 kg/m   BP Readings from  Last 3 Encounters:  05/11/20 117/80  04/28/20 (!) 100/57  04/25/20 111/70    Wt Readings from Last 3 Encounters:  05/11/20 177 lb 8 oz (80.5 kg) (94 %, Z= 1.55)*  04/25/20 202 lb 6.1 oz (91.8 kg) (98 %, Z= 1.98)*  04/25/20 202 lb 4.8 oz (91.8 kg) (98 %, Z= 1.98)*   * Growth percentiles are based on CDC (Girls, 2-20 Years) data.     Physical Exam Constitutional:      Appearance: She is well-developed and well-nourished.  HENT:     Head: Normocephalic and atraumatic.  Cardiovascular:     Rate and Rhythm: Normal rate and regular rhythm.     Heart sounds: No murmur heard.   Pulmonary:     Effort: Pulmonary effort is normal.     Breath sounds: Normal breath sounds.  Abdominal:     General: Bowel sounds are normal.     Palpations: Abdomen is soft. There is no mass.     Tenderness: There is no abdominal tenderness. There is no guarding or rebound.  Musculoskeletal:        General: No tenderness.  Skin:    General: Skin is warm and dry.  Neurological:     Mental Status: She is alert and oriented to person, place, and time.  Psychiatric:        Mood and Affect: Mood and affect normal.  Behavior: Behavior normal.       Assessment & Plan:   Nichole [name] was seen today for dysuria, abdominal pain and pressure.  Diagnoses and all orders for this visit:  Dysuria -     Urine Culture -     Urinalysis, Complete  Other orders -     amoxicillin (AMOXIL) 500 MG capsule; Take 1 capsule (500 mg total) by mouth 3 (three) times daily.    I am having Nichole [name] start on amoxicillin. I am also having her maintain her albuterol, Prenatal Vit-Fe Fumarate-FA (PRENATAL VITAMIN PO), Blood Pressure Monitor, docusate sodium, acetaminophen, ibuprofen, and ferrous sulfate.  Allergies as of 05/11/2020   No Known Allergies     Medication List       Accurate as of May 11, 2020  9:49 AM. If you have any questions, ask your nurse or doctor.        acetaminophen 325  MG tablet Commonly known as: Tylenol Take 2 tablets (650 mg total) by mouth every 6 (six) hours as needed for mild pain or fever.   albuterol 108 (90 Base) MCG/ACT inhaler Commonly known as: VENTOLIN HFA Inhale 2 puffs into the lungs every 6 (six) hours as needed for wheezing or shortness of breath.   amoxicillin 500 MG capsule Commonly known as: AMOXIL Take 1 capsule (500 mg total) by mouth 3 (three) times daily. Started by: Mechele Claude, MD   Blood Pressure Monitor Misc For regular home bp monitoring during pregnancy   docusate sodium 250 MG capsule Commonly known as: COLACE Take 1 capsule (250 mg total) by mouth 2 (two) times daily as needed for constipation.   ferrous sulfate 325 (65 FE) MG tablet Take 1 tablet (325 mg total) by mouth every other day.   ibuprofen 600 MG tablet Commonly known as: ADVIL Take 1 tablet (600 mg total) by mouth every 6 (six) hours.   PRENATAL VITAMIN PO Take by mouth.        Follow-up: Return if symptoms worsen or fail to improve.  Mechele Claude, M.D.

## 2020-05-13 LAB — URINE CULTURE

## 2020-06-07 ENCOUNTER — Ambulatory Visit (INDEPENDENT_AMBULATORY_CARE_PROVIDER_SITE_OTHER): Payer: Medicaid Other | Admitting: Women's Health

## 2020-06-07 ENCOUNTER — Other Ambulatory Visit: Payer: Self-pay

## 2020-06-07 ENCOUNTER — Encounter: Payer: Self-pay | Admitting: Women's Health

## 2020-06-07 DIAGNOSIS — Z8759 Personal history of other complications of pregnancy, childbirth and the puerperium: Secondary | ICD-10-CM

## 2020-06-07 NOTE — Progress Notes (Signed)
POSTPARTUM VISIT Patient name: Nichole [name] MRN [id number]  Date of birth: 09/23/2000 Chief Complaint:   Postpartum Care  History of Present Illness:   Nichole [name] is a 20 y.o. G37P1001 Hispanic female being seen today for a postpartum visit. She is 6 weeks postpartum following a spontaneous vaginal delivery at 40.3 gestational weeks. IOL: No, for n/a. Anesthesia: IV sedation.  Laceration: none.  Complications: PPH 1117BV d/t atony, received pit/TXA/cytotec. Inpatient contraception: yes Nexplanon in hospital.   Pregnancy uncomplicated. Tobacco use: no. Substance use disorder: no. Last pap smear: <21yo and results were N/A. Next pap smear due: @ 21yo No LMP recorded. (Menstrual status: Lactating).  Postpartum course has been uncomplicated. Bleeding no bleeding. Bowel function is normal. Bladder function is normal. Urinary incontinence? No, fecal incontinence? No Patient is sexually active.  Desired contraception: PP Nexplanon placed. Patient does want a pregnancy in the future.   The pregnancy intention screening data noted above was reviewed. Potential methods of contraception were discussed. The patient elected to proceed with Hormonal Implant.   Edinburgh Postpartum Depression Screening: Negative  Edinburgh Postnatal Depression Scale - 06/07/20 1617      Edinburgh Postnatal Depression Scale:  In the Past 7 Days   I have been able to laugh and see the funny side of things. 0    I have looked forward with enjoyment to things. 0    I have blamed myself unnecessarily when things went wrong. 0    I have been anxious or worried for no good reason. 0    I have felt scared or panicky for no good reason. 0    Things have been getting on top of me. 0    I have been so unhappy that I have had difficulty sleeping. 0    I have felt sad or miserable. 0    I have been so unhappy that I have been crying. 0    The thought of harming myself has occurred to me. 0    Edinburgh Postnatal  Depression Scale Total 0          Baby's course has been uncomplicated. Baby is feeding by breast and bottle: milk supply inadequate . Infant has a pediatrician/family doctor? Yes.  Childcare strategy if returning to work/school: n/a.  Pt has material needs met for her and baby: Yes.   Review of Systems:   Pertinent items are noted in HPI Denies Abnormal vaginal discharge w/ itching/odor/irritation, headaches, visual changes, shortness of breath, chest pain, abdominal pain, severe nausea/vomiting, or problems with urination or bowel movements. Pertinent History Reviewed:  Reviewed past medical,surgical, obstetrical and family history.  Reviewed problem list, medications and allergies. OB History  Gravida Para Term Preterm AB Living  _0 SAB IAB Ectopic Multiple Live Births        0 1    # Outcome Date GA Lbr Len/2nd Weight Sex Delivery Anes PTL Lv  1 Term 04/26/20 [id number]:24 / 00:18 6 lb 8.2 oz (2.954 kg) M Vag-Spont None  LIV     Birth Comments: WNL   Physical Assessment:   Vitals:   06/07/20 1614  BP: 115/76  Pulse: 90  Weight: 182 lb (82.6 kg)  Height: _1  (1.6 m)  Body mass index is 32.24 kg/m.       Physical Examination:   General appearance: alert, well appearing, and in no distress  Mental status: alert, oriented to person, place, and time  Skin: warm & dry   Cardiovascular: normal heart rate noted   Respiratory: normal respiratory effort, no distress   Breasts: deferred, no complaints   Abdomen: soft, non-tender   Pelvic: examination not indicated  Extremities: no edema  Chaperone: N/A         No results found for this or any previous visit (from the past 24 hour(s)).  Assessment & Plan:  1) Postpartum exam 2) 6 wks s/p spontaneous vaginal delivery w/ PPH 3) breast & bottle feeding 4) Depression screening 5) Contraception> s/p Nexplanon in hospital  Essential components of care per ACOG recommendations:  1.  Mood and well being:   If  positive depression screen, discussed and plan developed.   If using tobacco we discussed reduction/cessation and risk of relapse  If current substance abuse, we discussed and referral to local resources was offered.   2. Infant care and feeding:   If breastfeeding, discussed returning to work, pumping, breastfeeding-associated pain, guidance regarding return to fertility while lactating if not using another method. If needed, patient was provided with a letter to be allowed to pump q 2-3hrs to support lactation in a private location with access to a refrigerator to store breastmilk.    Recommended that all caregivers be immunized for flu, pertussis and other preventable communicable diseases  If pt does not have material needs met for her/baby, referred to local resources for help obtaining these.  3. Sexuality, contraception and birth spacing  Provided guidance regarding sexuality, management of dyspareunia, and resumption of intercourse  Discussed avoiding interpregnancy interval <32mhs and recommended birth spacing of 18 months  4. Sleep and fatigue  Discussed coping options for fatigue and sleep disruption  Encouraged family/partner/community support of 4 hrs of uninterrupted sleep to help with mood and fatigue  5. Physical recovery   If pt had a C/S, assessed incisional pain and providing guidance on normal vs prolonged recovery  If pt had a laceration, perineal healing and pain reviewed.   If urinary or fecal incontinence, discussed management and referred to PT or uro/gyn if indicated   Patient is safe to resume physical activity. Discussed attainment of healthy weight.  6.  Chronic disease management  Discussed pregnancy complications if any, and their implications for future childbearing and long-term maternal health.  Review recommendations for prevention of recurrent pregnancy complications, such as 17 hydroxyprogesterone caproate to reduce risk for recurrent PTB  not applicable, or aspirin to reduce risk of preeclampsia not applicable.  Pt had GDM: No. If yes, 2hr GTT scheduled: not applicable.  Reviewed medications and non-pregnant dosing including consideration of whether pt is breastfeeding using a reliable resource such as LactMed: yes  Referred for f/u w/ PCP or subspecialist providers as indicated: not applicable  7. Health maintenance  Mammogram at [place] earlier if indicated  Pap smears as indicated  Meds: No orders of the defined types were placed in this encounter.   Follow-up: Return for @ 21yo for , Pap & physical.   No orders of the defined types were placed in this encounter.   KWallace Ridge WIn[facility]3/22/2022 4:57 PM

## 2020-06-07 NOTE — Patient Instructions (Signed)
Tips To Increase Milk Supply Lots of water! Enough so that your urine is clear Plenty of calories, if you're not getting enough calories, your milk supply can decrease Breastfeed/pump often, every 2-3 hours x 20-30mins Fenugreek 3 pills 3 times a day, this may make your urine smell like maple syrup Mother's Milk Tea Lactation cookies, google for the recipe Real oatmeal Body Armor sports drinks Greater Than hydration drink  

## 2020-08-12 ENCOUNTER — Encounter: Payer: Self-pay | Admitting: Nurse Practitioner

## 2020-08-12 ENCOUNTER — Ambulatory Visit (INDEPENDENT_AMBULATORY_CARE_PROVIDER_SITE_OTHER): Payer: Medicaid Other | Admitting: Nurse Practitioner

## 2020-08-12 ENCOUNTER — Other Ambulatory Visit: Payer: Self-pay

## 2020-08-12 VITALS — BP 110/70 | HR 81 | Temp 97.9°F | Ht 63.0 in | Wt 200.0 lb

## 2020-08-12 DIAGNOSIS — L239 Allergic contact dermatitis, unspecified cause: Secondary | ICD-10-CM | POA: Diagnosis not present

## 2020-08-12 MED ORDER — PREDNISONE 10 MG (21) PO TBPK: ORAL_TABLET | ORAL | 0 refills | Status: DC

## 2020-08-12 MED ORDER — HYDROCORTISONE 1 % EX LOTN: 1.0000 "application " | TOPICAL_LOTION | Freq: Two times a day (BID) | CUTANEOUS | 0 refills | Status: DC

## 2020-08-12 NOTE — Patient Instructions (Signed)
Prednisone pack, cool compress, topical hydrocortisone cream.  Follow-up with worsening or unresolved symptoms.   Rash, Adult  A rash is a change in the color of your skin. A rash can also change the way your skin feels. There are many different conditions and factors that can cause a rash. Follow these instructions at home: The goal of treatment is to stop the itching and keep the rash from spreading. Watch for any changes in your symptoms. Let your doctor know about them. Follow these instructions to help with your condition: Medicine Take or apply over-the-counter and prescription medicines only as told by your doctor. These may include medicines:  To treat red or swollen skin (corticosteroid creams).  To treat itching.  To treat an allergy (oral antihistamines).  To treat very bad symptoms (oral corticosteroids).   Skin care  Put cool cloths (compresses) on the affected areas.  Do not scratch or rub your skin.  Avoid covering the rash. Make sure that the rash is exposed to air as much as possible. Managing itching and discomfort  Avoid hot showers or baths. These can make itching worse. A cold shower may help.  Try taking a bath with: ? Epsom salts. You can get these at your local pharmacy or grocery store. Follow the instructions on the package. ? Baking soda. Pour a small amount into the bath as told by your doctor. ? Colloidal oatmeal. You can get this at your local pharmacy or grocery store. Follow the instructions on the package.  Try putting baking soda paste onto your skin. Stir water into baking soda until it gets like a paste.  Try putting on a lotion that relieves itchiness (calamine lotion).  Keep cool and out of the sun. Sweating and being hot can make itching worse. General instructions  Rest as needed.  Drink enough fluid to keep your pee (urine) pale yellow.  Wear loose-fitting clothing.  Avoid scented soaps, detergents, and perfumes. Use gentle  soaps, detergents, perfumes, and other cosmetic products.  Avoid anything that causes your rash. Keep a journal to help track what causes your rash. Write down: ? What you eat. ? What cosmetic products you use. ? What you drink. ? What you wear. This includes jewelry.  Keep all follow-up visits as told by your doctor. This is important.   Contact a doctor if:  You sweat at night.  You lose weight.  You pee (urinate) more than normal.  You pee less than normal, or you notice that your pee is a darker color than normal.  You feel weak.  You throw up (vomit).  Your skin or the whites of your eyes look yellow (jaundice).  Your skin: ? Tingles. ? Is numb.  Your rash: ? Does not go away after a few days. ? Gets worse.  You are: ? More thirsty than normal. ? More tired than normal.  You have: ? New symptoms. ? Pain in your belly (abdomen). ? A fever. ? Watery poop (diarrhea). Get help right away if:  You have a fever and your symptoms suddenly get worse.  You start to feel mixed up (confused).  You have a very bad headache or a stiff neck.  You have very bad joint pains or stiffness.  You have jerky movements that you cannot control (seizure).  Your rash covers all or most of your body. The rash may or may not be painful.  You have blisters that: ? Are on top of the rash. ? Grow larger. ?  Grow together. ? Are painful. ? Are inside your nose or mouth.  You have a rash that: ? Looks like purple pinprick-sized spots all over your body. ? Has a "bull's eye" or looks like a target. ? Is red and painful, causes your skin to peel, and is not from being in the sun too long. Summary  A rash is a change in the color of your skin. A rash can also change the way your skin feels.  The goal of treatment is to stop the itching and keep the rash from spreading.  Take or apply over-the-counter and prescription medicines only as told by your doctor.  Contact a doctor  if you have new symptoms or symptoms that get worse.  Keep all follow-up visits as told by your doctor. This is important. This information is not intended to replace advice given to you by your health care provider. Make sure you discuss any questions you have with your health care provider. Document Revised: 06/27/2018 Document Reviewed: 10/07/2017 Elsevier Patient Education  2021 [employer].

## 2020-08-12 NOTE — Assessment & Plan Note (Signed)
Symptoms not well controlled in the last few days.  Symptoms worse in the morning.  Wide area of redness and patches on bilateral thighs.  Patient has not used any new cosmetics, shampoos or creams in the last few weeks.  No changes in diet.  No contact with new pets. Prednisone taper and 1% hydrocortisone cream ordered. Advised patient to use cold compress, cool baths, keep skin moisturized, avoid scratching with fingernails. Education provided with printed handouts given.  Follow-up with worsening unresolved symptoms.  Patient verbalized understanding

## 2020-08-12 NOTE — Progress Notes (Signed)
Acute Office Visit  Subjective:    Patient ID: Nichole [name], female    DOB: 07-19-2000, 20 y.o.   MRN: [id number]  Chief Complaint  Patient presents with  . Rash    Rash This is a new problem. The current episode started in the past 7 days. The problem is unchanged. The rash is diffuse. The rash is characterized by dryness, redness and itchiness. Pertinent negatives include no cough, facial edema, shortness of breath or sore throat. Past treatments include nothing. Her past medical history is significant for asthma.    Past Medical History:  Diagnosis Date  . Asthma     Past Surgical History:  Procedure Laterality Date  . NO PAST SURGERIES      Family History  Problem Relation Age of Onset  . Diabetes Father     Social History   Socioeconomic History  . Marital status: Single    Spouse name: Not on file  . Number of children: Not on file  . Years of education: Not on file  . Highest education level: Not on file  Occupational History  . Not on file  Tobacco Use  . Smoking status: Never Smoker  . Smokeless tobacco: Never Used  Vaping Use  . Vaping Use: Never used  Substance and Sexual Activity  . Alcohol use: No  . Drug use: No  . Sexual activity: Yes    Birth control/protection: None  Other Topics Concern  . Not on file  Social History Narrative  . Not on file   Social Determinants of Health   Financial Resource Strain: Medium Risk  . Difficulty of Paying Living Expenses: Somewhat hard  Food Insecurity: No Food Insecurity  . Worried About Programme researcher, broadcasting/film/video in the Last Year: Never true  . Ran Out of Food in the Last Year: Never true  Transportation Needs: No Transportation Needs  . Lack of Transportation (Medical): No  . Lack of Transportation (Non-Medical): No  Physical Activity: Sufficiently Active  . Days of Exercise per Week: 6 days  . Minutes of Exercise per Session: 30 min  Stress: No Stress Concern Present  . Feeling of Stress : Only  a little  Social Connections: Socially Integrated  . Frequency of Communication with Friends and Family: Three times a week  . Frequency of Social Gatherings with Friends and Family: Once a week  . Attends Religious Services: More than 4 times per year  . Active Member of Clubs or Organizations: Yes  . Attends Banker Meetings: Never  . Marital Status: Married  Catering manager Violence: Not At Risk  . Fear of Current or Ex-Partner: No  . Emotionally Abused: No  . Physically Abused: No  . Sexually Abused: No    Outpatient Medications Prior to Visit  Medication Sig Dispense Refill  . etonogestrel (NEXPLANON) 68 MG IMPL implant 1 each by Subdermal route once.    Marland Kitchen acetaminophen (TYLENOL) 325 MG tablet Take 2 tablets (650 mg total) by mouth every 6 (six) hours as needed for mild pain or fever. (Patient not taking: Reported on 06/07/2020) 30 tablet 1  . albuterol (VENTOLIN HFA) 108 (90 Base) MCG/ACT inhaler Inhale 2 puffs into the lungs every 6 (six) hours as needed for wheezing or shortness of breath. (Patient not taking: Reported on 06/07/2020) 18 g 1  . amoxicillin (AMOXIL) 500 MG capsule Take 1 capsule (500 mg total) by mouth 3 (three) times daily. (Patient not taking: Reported on 06/07/2020) 21 capsule 0  .  Blood Pressure Monitor MISC For regular home bp monitoring during pregnancy (Patient not taking: Reported on 06/07/2020) 1 each 0  . docusate sodium (COLACE) 250 MG capsule Take 1 capsule (250 mg total) by mouth 2 (two) times daily as needed for constipation. (Patient not taking: Reported on 06/07/2020) 30 capsule 4  . ferrous sulfate 325 (65 FE) MG tablet Take 1 tablet (325 mg total) by mouth every other day. (Patient not taking: Reported on 06/07/2020) 30 tablet 0  . ibuprofen (ADVIL) 600 MG tablet Take 1 tablet (600 mg total) by mouth every 6 (six) hours. (Patient not taking: Reported on 06/07/2020) 30 tablet 0  . Prenatal Vit-Fe Fumarate-FA (PRENATAL VITAMIN PO) Take by mouth.  (Patient not taking: Reported on 06/07/2020)     No facility-administered medications prior to visit.    Review of Systems  HENT: Negative.  Negative for sore throat.   Respiratory: Negative for cough and shortness of breath.   Cardiovascular: Negative.   Genitourinary: Negative.   Skin: Positive for rash.  All other systems reviewed and are negative.      Objective:    Physical Exam Vitals reviewed.  Constitutional:      Appearance: Normal appearance.  HENT:     Head: Normocephalic.     Nose: Nose normal.     Mouth/Throat:     Mouth: Mucous membranes are moist.  Eyes:     Conjunctiva/sclera: Conjunctivae normal.  Cardiovascular:     Rate and Rhythm: Normal rate.     Pulses: Normal pulses.     Heart sounds: Normal heart sounds.  Pulmonary:     Effort: Pulmonary effort is normal.     Breath sounds: Normal breath sounds.  Abdominal:     General: Bowel sounds are normal.  Skin:    Findings: Erythema and rash present. No bruising.  Neurological:     Mental Status: She is alert.  Psychiatric:        Behavior: Behavior normal.     BP 110/70   Pulse 81   Temp 97.9 F (36.6 C) (Temporal)   Ht 5\' 3"  (1.6 m)   Wt 200 lb (90.7 kg)   SpO2 100%   BMI 35.43 kg/m  Wt Readings from Last 3 Encounters:  08/12/20 200 lb (90.7 kg) (97 %, Z= 1.94)*  06/07/20 182 lb (82.6 kg) (95 %, Z= 1.63)*  05/11/20 177 lb 8 oz (80.5 kg) (94 %, Z= 1.55)*   * Growth percentiles are based on CDC (Girls, 2-20 Years) data.    Health Maintenance Due  Topic Date Due  . COVID-19 Vaccine (1) Never done  . HPV VACCINES (1 - 2-dose series) Never done       Topic Date Due  . HPV VACCINES (1 - 2-dose series) Never done     Lab Results  Component Value Date   TSH 1.420 11/21/2017   Lab Results  Component Value Date   WBC 11.2 (H) 04/27/2020   HGB 7.8 (L) 04/27/2020   HCT 23.4 (L) 04/27/2020   MCV 82.4 04/27/2020   PLT 164 04/27/2020   Lab Results  Component Value Date   NA 137  03/02/2020   K 3.6 03/02/2020   CO2 23 03/02/2020   GLUCOSE 92 03/02/2020   BUN <5 (L) 03/02/2020   CREATININE 0.50 03/02/2020   BILITOT 0.4 03/02/2020   ALKPHOS 86 03/02/2020   AST 18 03/02/2020   ALT 13 03/02/2020   PROT 6.3 (L) 03/02/2020   ALBUMIN 3.0 (L) 03/02/2020  CALCIUM 9.1 03/02/2020   ANIONGAP 10 03/02/2020   No results found for: CHOL No results found for: HDL No results found for: LDLCALC No results found for: TRIG No results found for: CHOLHDL Lab Results  Component Value Date   HGBA1C 5.3 10/21/2019       Assessment & Plan:   Problem List Items Addressed This Visit      Musculoskeletal and Integument   Allergic dermatitis - Primary    Symptoms not well controlled in the last few days.  Symptoms worse in the morning.  Wide area of redness and patches on bilateral thighs.  Patient has not used any new cosmetics, shampoos or creams in the last few weeks.  No changes in diet.  No contact with new pets. Prednisone taper and 1% hydrocortisone cream ordered. Advised patient to use cold compress, cool baths, keep skin moisturized, avoid scratching with fingernails. Education provided with printed handouts given.  Follow-up with worsening unresolved symptoms.  Patient verbalized understanding       Relevant Medications   predniSONE (STERAPRED UNI-PAK 21 TAB) 10 MG (21) TBPK tablet   hydrocortisone 1 % lotion       Meds ordered this encounter  Medications  . predniSONE (STERAPRED UNI-PAK 21 TAB) 10 MG (21) TBPK tablet    Sig: 6 tablets day 1, 5 tablets day 2, 4 tablet day 3, 3 tablet day 4, 2 tablet day 5, 1 tablet day 6.    Dispense:  21 tablet    Refill:  0    Order Specific Question:   Supervising Provider    Answer:   Raliegh Ip [[id number]]  . hydrocortisone 1 % lotion    Sig: Apply 1 application topically 2 (two) times daily.    Dispense:  118 mL    Refill:  0    Order Specific Question:   Supervising Provider    Answer:   Raliegh Ip [[id number]]     Daryll Drown, NP

## 2020-09-20 ENCOUNTER — Other Ambulatory Visit: Payer: Self-pay | Admitting: Nurse Practitioner

## 2020-09-20 MED ORDER — POLYMYXIN B-TRIMETHOPRIM 10000-0.1 UNIT/ML-% OP SOLN: 2.0000 [drp] | OPHTHALMIC | 0 refills | Status: DC

## 2020-09-25 ENCOUNTER — Emergency Department (HOSPITAL_COMMUNITY)
Admission: EM | Admit: 2020-09-25 | Discharge: 2020-09-25 | Disposition: A | Discharge status: Home / Self Care | Payer: Medicaid Other | Attending: Emergency Medicine | Admitting: Emergency Medicine

## 2020-09-25 ENCOUNTER — Encounter (HOSPITAL_COMMUNITY): Payer: Self-pay

## 2020-09-25 ENCOUNTER — Other Ambulatory Visit: Payer: Self-pay

## 2020-09-25 DIAGNOSIS — J029 Acute pharyngitis, unspecified: Secondary | ICD-10-CM | POA: Diagnosis present

## 2020-09-25 DIAGNOSIS — H9203 Otalgia, bilateral: Secondary | ICD-10-CM | POA: Insufficient documentation

## 2020-09-25 DIAGNOSIS — J45909 Unspecified asthma, uncomplicated: Secondary | ICD-10-CM | POA: Insufficient documentation

## 2020-09-25 DIAGNOSIS — Z20822 Contact with and (suspected) exposure to covid-19: Secondary | ICD-10-CM | POA: Insufficient documentation

## 2020-09-25 DIAGNOSIS — J069 Acute upper respiratory infection, unspecified: Secondary | ICD-10-CM | POA: Diagnosis not present

## 2020-09-25 LAB — GROUP A STREP BY PCR: Group A Strep by PCR: NOT DETECTED

## 2020-09-25 LAB — RESP PANEL BY RT-PCR (FLU A&B, COVID) ARPGX2
Influenza A by PCR: NEGATIVE
Influenza B by PCR: NEGATIVE
SARS Coronavirus 2 by RT PCR: NEGATIVE

## 2020-09-25 NOTE — ED Provider Notes (Signed)
[facility] EMERGENCY DEPARTMENT Provider Note   CSN: [id number] Arrival date & time: 09/25/20  1005     History Chief Complaint  Patient presents with   Sore Throat    Nichole [name] is a 20 y.o. female.  Mom has not been feeling well since July 4.  Cough sore throat intermittent ear pain bilaterally.  No chills.  Questionable fevers.  Nausea no diarrhea no vomiting.  Patient is here with her 60-month-old as well with similar symptoms.      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 Authorizing Provider  etonogestrel (NEXPLANON) 68 MG IMPL implant 1 each by Subdermal route once.    [provider]  hydrocortisone 1 % lotion Apply 1 application topically 2 (two) times daily. 08/12/20   Daryll Drown, NP  predniSONE (STERAPRED UNI-PAK 21 TAB) 10 MG (21) TBPK tablet 6 tablets day 1, 5 tablets day 2, 4 tablet day 3, 3 tablet day 4, 2 tablet day 5, 1 tablet day 6. 08/12/20   Daryll Drown, NP  trimethoprim-polymyxin b (POLYTRIM) ophthalmic solution Place 2 drops into both eyes every 4 (four) hours. 09/20/20   Bennie Pierini, FNP    Allergies    Patient has no known allergies.  Review of Systems   Review of Systems  Constitutional:  Positive for fever. Negative for chills.  HENT:  Positive for ear pain and sore throat.   Eyes:  Negative for pain and visual disturbance.  Respiratory:  Positive for cough. Negative for shortness of breath.   Cardiovascular:  Negative for chest pain and palpitations.  Gastrointestinal:  Positive for nausea. Negative for abdominal pain, diarrhea and vomiting.  Genitourinary:  Negative for dysuria and hematuria.  Musculoskeletal:  Negative for arthralgias and back pain.  Skin:  Negative for color change and rash.  Neurological:  Negative for seizures and syncope.  All other systems reviewed and are negative.  Physical Exam Updated Vital Signs BP 128/76 (BP Location: Left Arm)   Pulse (!) 119   Temp 98.9 F (37.2 C) (Oral)   Resp 18   Ht 1.626 m (5\' 4" )   Wt 90.7 kg   SpO2 99%   BMI 34.33 kg/m   Physical Exam Vitals  and nursing note reviewed.  Constitutional:      General: She is not in acute distress.    Appearance: Normal appearance. She is well-developed.  HENT:     Head: Normocephalic and atraumatic.     Right Ear: Tympanic membrane, ear canal and external ear normal.     Left Ear: Tympanic membrane, ear canal and external ear normal.     Mouth/Throat:     Mouth: Mucous membranes are moist.     Pharynx: Posterior oropharyngeal erythema present. No oropharyngeal exudate.     Comments: Uvula midline.  Tonsils not enlarged.  Soft palate with some erythema.  No exudate. Eyes:     Extraocular Movements: Extraocular movements intact.     Conjunctiva/sclera: Conjunctivae normal.     Pupils: Pupils are equal, round, and reactive to light.  Cardiovascular:     Rate and Rhythm: Normal rate and regular rhythm.     Heart sounds: No murmur heard. Pulmonary:     Effort: Pulmonary effort is normal. No respiratory distress.     Breath sounds: Normal  breath sounds. No wheezing, rhonchi or rales.  Abdominal:     Palpations: Abdomen is soft.     Tenderness: There is no abdominal tenderness.  Musculoskeletal:     Cervical back: Neck supple.  Skin:    General: Skin is warm and dry.     Capillary Refill: Capillary refill takes less than 2 seconds.     Findings: No rash.  Neurological:     General: No focal deficit present.     Mental Status: She is alert and oriented to person, place, and time.    ED Results / Procedures / Treatments   Labs (all labs ordered are listed, but only abnormal results are displayed) Labs Reviewed  GROUP A STREP BY PCR  RESP PANEL BY RT-PCR (FLU A&B, COVID) [id number]    EKG None  Radiology No results found.  Procedures Procedures   Medications Ordered in ED Medications - No data to display  ED Course  I have reviewed the triage vital signs and the nursing notes.  Pertinent labs & imaging results that were available during my care of the patient were reviewed by me and considered in my medical decision making (see chart for details).    MDM Rules/Calculators/A&P                          Patient's 72-month-old is also sick with similar illness starting 2 days after her.  We will go ahead and test mom for strep and COVID influenza.  If positive for COVID or influenza then patient child probably has the same illness.  Mom's work-up without any acute findings.  Could be treated symptomatically.  Rapid strep and COVID and flu testing all negative.  We will treat the patient symptomatically.   Final Clinical Impression(s) / ED Diagnoses Final diagnoses:  Pharyngitis, unspecified etiology  Upper respiratory tract infection, unspecified type    Rx / DC Orders ED Discharge Orders     None        Vanetta Mulders, MD 09/25/20 1138

## 2020-09-25 NOTE — ED Triage Notes (Signed)
Complaining of cough, sore throat, and earache for the past week. Child sick as well.

## 2020-09-25 NOTE — Discharge Instructions (Addendum)
Make an appointment follow-up with your primary care doctor.  Would recommend over-the-counter cold and flu symptoms.  Your COVID testing influenza testing and rapid strep were negative.

## 2020-09-26 ENCOUNTER — Telehealth: Payer: Self-pay

## 2020-09-26 NOTE — Telephone Encounter (Signed)
Transition Care Management Follow-up Telephone Call Date of discharge and from where: 09/25/2020- [facility] How have you been since you were released from the hospital? Feels worse. Developed Diarrhea  Any questions or concerns? No  Items Reviewed: Did the pt receive and understand the discharge instructions provided? Yes  Medications obtained and verified?  No medications were prescribed  Other? No  Any new allergies since your discharge? No  Dietary orders reviewed? N/A Do you have support at home? Yes   Home Care and Equipment/Supplies: Were home health services ordered? not applicable If so, what is the name of the agency? N/A  Has the agency set up a time to come to the patient's home? not applicable Were any new equipment or medical supplies ordered?  No What is the name of the medical supply agency? N/A Were you able to get the supplies/equipment? not applicable Do you have any questions related to the use of the equipment or supplies? No  Functional Questionnaire: (I = Independent and D = Dependent) ADLs: I  Bathing/Dressing- I  Meal Prep- I  Eating- I  Maintaining continence- I  Transferring/Ambulation- I  Managing Meds- I  Follow up appointments reviewed:  PCP Hospital f/u appt confirmed? No  Patient advised to follow up with PCP as soon as possible.  Specialist Hospital f/u appt confirmed? No  Are transportation arrangements needed? No  If their condition worsens, is the pt aware to call PCP or go to the Emergency Dept.? Yes Was the patient provided with contact information for the PCP's office or ED? Yes Was to pt encouraged to call back with questions or concerns? Yes

## 2020-09-27 ENCOUNTER — Ambulatory Visit (INDEPENDENT_AMBULATORY_CARE_PROVIDER_SITE_OTHER): Payer: Medicaid Other | Admitting: Family Medicine

## 2020-09-27 ENCOUNTER — Encounter: Payer: Self-pay | Admitting: Family Medicine

## 2020-09-27 VITALS — BP 129/81 | HR 106 | Temp 97.8°F

## 2020-09-27 DIAGNOSIS — R0602 Shortness of breath: Secondary | ICD-10-CM | POA: Diagnosis not present

## 2020-09-27 DIAGNOSIS — J988 Other specified respiratory disorders: Secondary | ICD-10-CM

## 2020-09-27 DIAGNOSIS — B9689 Other specified bacterial agents as the cause of diseases classified elsewhere: Secondary | ICD-10-CM

## 2020-09-27 MED ORDER — AMOXICILLIN-POT CLAVULANATE 875-125 MG PO TABS: 1.0000 | ORAL_TABLET | Freq: Two times a day (BID) | ORAL | 0 refills | Status: AC

## 2020-09-27 MED ORDER — ALBUTEROL SULFATE HFA 108 (90 BASE) MCG/ACT IN AERS: 2.0000 | INHALATION_SPRAY | Freq: Four times a day (QID) | RESPIRATORY_TRACT | 2 refills | Status: DC | PRN

## 2020-09-27 NOTE — Progress Notes (Signed)
Assessment & Plan:  1. Bacterial respiratory infection - amoxicillin-clavulanate (AUGMENTIN) 875-125 MG tablet; Take 1 tablet by mouth 2 (two) times daily for 7 days.  Dispense: 14 tablet; Refill: 0  2. Shortness of breath - albuterol (VENTOLIN HFA) 108 (90 Base) MCG/ACT inhaler; Inhale 2 puffs into the lungs every 6 (six) hours as needed.  Dispense: 18 g; Refill: 2   Follow up plan: Return if symptoms worsen or fail to improve.  Deliah Boston, MSN, APRN, FNP-C [facility]  Subjective:   Patient ID: Nichole [name], female    DOB: 05-18-2000, 20 y.o.   MRN: [id number]  HPI: Nichole [name] is a 20 y.o. female presenting on 09/27/2020 for Cough and Sore Throat (X 8 days )  Patient complains of cough, headache, runny nose, sore throat, ear pain/pressure, shortness of breath, and nausea. Onset of symptoms was 8 days ago, gradually worsening since that time. She is drinking plenty of fluids. Evaluation to date: seen previously in the ER and thought to have a viral URI.  She tested negative for strep, COVID, and flu.  Treatment to date: none. She has a history of asthma. She does not smoke.   ROS: Negative unless specifically indicated above in HPI.   Relevant past medical history reviewed and updated as indicated.   Allergies and medications reviewed and updated.   Current Outpatient Medications:    etonogestrel (NEXPLANON) 68 MG IMPL implant, 1 each by Subdermal route once., Disp: , Rfl:    trimethoprim-polymyxin b (POLYTRIM) ophthalmic solution, Place 2 drops into both eyes every 4 (four) hours., Disp: 10 mL, Rfl: 0  No Known Allergies  Objective:   BP 129/81   Pulse (!) 106   Temp 97.8 F (36.6 C) (Temporal)   SpO2 97%    Physical Exam Vitals reviewed.  Constitutional:      General: She is not in acute distress.    Appearance: Normal appearance. She is not ill-appearing, toxic-appearing or diaphoretic.  HENT:     Head: Normocephalic and  atraumatic.     Right Ear: Tympanic membrane, ear canal and external ear normal. There is no impacted cerumen.     Left Ear: Tympanic membrane, ear canal and external ear normal. There is no impacted cerumen.     Nose: Nose normal.     Mouth/Throat:     Mouth: Mucous membranes are moist.     Pharynx: Oropharyngeal exudate and posterior oropharyngeal erythema present.     Tonsils: Tonsillar exudate present.  Eyes:     General: No scleral icterus.       Right eye: No discharge.        Left eye: No discharge.     Conjunctiva/sclera: Conjunctivae normal.  Cardiovascular:     Rate and Rhythm: Normal rate and regular rhythm.     Heart sounds: Normal heart sounds. No murmur heard.   No friction rub. No gallop.  Pulmonary:     Effort: Pulmonary effort is normal. No respiratory distress.     Breath sounds: Normal breath sounds. No stridor. No wheezing, rhonchi or rales.  Chest:     Chest wall: No tenderness.  Musculoskeletal:        General: Normal range of motion.     Cervical back: Normal range of motion.  Lymphadenopathy:     Cervical: No cervical adenopathy.  Skin:    General: Skin is warm and dry.     Capillary Refill: Capillary refill takes less than 2 seconds.  Neurological:     General: No focal deficit present.     Mental Status: She is alert and oriented to person, place, and time. Mental status is at baseline.  Psychiatric:        Mood and Affect: Mood normal.        Behavior: Behavior normal.        Thought Content: Thought content normal.        Judgment: Judgment normal.

## 2021-01-09 DIAGNOSIS — H5213 Myopia, bilateral: Secondary | ICD-10-CM | POA: Diagnosis not present

## 2021-02-08 ENCOUNTER — Encounter: Payer: Self-pay | Admitting: Family Medicine

## 2021-02-08 ENCOUNTER — Ambulatory Visit (INDEPENDENT_AMBULATORY_CARE_PROVIDER_SITE_OTHER): Payer: Medicaid Other | Admitting: Family Medicine

## 2021-02-08 DIAGNOSIS — J069 Acute upper respiratory infection, unspecified: Secondary | ICD-10-CM | POA: Diagnosis not present

## 2021-02-08 MED ORDER — PSEUDOEPH-BROMPHEN-DM 30-2-10 MG/5ML PO SYRP: 5.0000 mL | ORAL_SOLUTION | Freq: Four times a day (QID) | ORAL | 0 refills | Status: DC | PRN

## 2021-02-08 NOTE — Progress Notes (Signed)
Virtual Visit via telephone Note Due to COVID-19 pandemic this visit was conducted virtually. This visit type was conducted due to national recommendations for restrictions regarding the COVID-19 Pandemic (e.g. social distancing, sheltering in place) in an effort to limit this patient's exposure and mitigate transmission in our community. All issues noted in this document were discussed and addressed.  A physical exam was not performed with this format.   I connected with Nichole [name] on 02/08/2021 at 1200 by telephone and verified that I am speaking with the correct person using two identifiers. Nichole [name] is currently located at home and family is currently with them during visit. The provider, Kari Baars, FNP is located in their office at time of visit.  I discussed the limitations, risks, security and privacy concerns of performing an evaluation and management service by telephone and the availability of in person appointments. I also discussed with the patient that there may be a patient responsible charge related to this service. The patient expressed understanding and agreed to proceed.  Subjective:  Patient ID: Nichole [name], female    DOB: 07-25-2000, 20 y.o.   MRN: [id number]  Chief Complaint:  URI   HPI: [name] is a 20 y.o. female presenting on 02/08/2021 for URI   Reports URI symptoms of cough, congestion, and rhinorrhea for 3 days. No relief of symptoms with theraflu. No fever reported.   URI  This is a new problem. The current episode started in the past 7 days. The problem has been waxing and waning. There has been no fever. Associated symptoms include congestion, coughing and rhinorrhea. Pertinent negatives include no abdominal pain, chest pain, diarrhea, dysuria, ear pain, headaches, joint pain, joint swelling, nausea, neck pain, plugged ear sensation, rash, sinus pain, sneezing, sore throat, swollen glands, vomiting or wheezing.     Relevant past medical, surgical, family, and social history reviewed and updated as indicated.  Allergies and medications reviewed and updated.   Past Medical History:  Diagnosis Date   Asthma     Past Surgical History:  Procedure Laterality Date   NO PAST SURGERIES      Social History   Socioeconomic History   Marital status: Single    Spouse name: Not on file   Number of children: Not on file   Years of education: Not on file   Highest education level: Not on file  Occupational History   Not on file  Tobacco Use   Smoking status: Never   Smokeless tobacco: Never  Vaping Use   Vaping Use: Never used  Substance and Sexual Activity   Alcohol use: No   Drug use: No   Sexual activity: Yes    Birth control/protection: None  Other Topics Concern   Not on file  Social History Narrative   Not on file   Social Determinants of Health   Financial Resource Strain: Medium Risk   Difficulty of Paying Living Expenses: Somewhat hard  Food Insecurity: No Food Insecurity   Worried About Programme researcher, broadcasting/film/video in the Last Year: Never true   Ran Out of Food in the Last Year: Never true  Transportation Needs: No Transportation Needs   Lack of Transportation (Medical): No   Lack of Transportation (Non-Medical): No  Physical Activity: Sufficiently Active   Days of Exercise per Week: 6 days   Minutes of Exercise per Session: 30 min  Stress: No Stress Concern Present   Feeling of Stress : Only a little  Social  Connections: Socially Integrated   Frequency of Communication with Friends and Family: Three times a week   Frequency of Social Gatherings with Friends and Family: Once a week   Attends Religious Services: More than 4 times per year   Active Member of [employer] or Organizations: Yes   Attends Banker Meetings: Never   Marital Status: Married  Catering manager Violence: Not At Risk   Fear of Current or Ex-Partner: No   Emotionally Abused: No   Physically  Abused: No   Sexually Abused: No    Outpatient Encounter Medications as of 02/08/2021  Medication Sig   brompheniramine-pseudoephedrine-DM 30-2-10 MG/5ML syrup Take 5 mLs by mouth 4 (four) times daily as needed.   albuterol (VENTOLIN HFA) 108 (90 Base) MCG/ACT inhaler Inhale 2 puffs into the lungs every 6 (six) hours as needed.   etonogestrel (NEXPLANON) 68 MG IMPL implant 1 each by Subdermal route once.   trimethoprim-polymyxin b (POLYTRIM) ophthalmic solution Place 2 drops into both eyes every 4 (four) hours.   No facility-administered encounter medications on file as of 02/08/2021.    No Known Allergies  Review of Systems  Constitutional:  Negative for activity change, appetite change, chills, diaphoresis, fatigue, fever and unexpected weight change.  HENT:  Positive for congestion, postnasal drip and rhinorrhea. Negative for ear pain, sinus pressure, sinus pain, sneezing, sore throat, trouble swallowing and voice change.   Eyes: Negative.   Respiratory:  Positive for cough. Negative for chest tightness, shortness of breath and wheezing.   Cardiovascular:  Negative for chest pain, palpitations and leg swelling.  Gastrointestinal:  Negative for abdominal pain, blood in stool, constipation, diarrhea, nausea and vomiting.  Endocrine: Negative.   Genitourinary:  Negative for decreased urine volume, dysuria, frequency and urgency.  Musculoskeletal:  Negative for arthralgias, joint pain, myalgias and neck pain.  Skin: Negative.  Negative for rash.  Allergic/Immunologic: Negative.   Neurological:  Negative for dizziness, weakness and headaches.  Hematological: Negative.   Psychiatric/Behavioral:  Negative for confusion, hallucinations, sleep disturbance and suicidal ideas.   All other systems reviewed and are negative.       Observations/Objective: No vital signs or physical exam, this was a telephone or virtual health encounter.  Pt alert and oriented, answers all questions  appropriately, and able to speak in full sentences.    Assessment and Plan: Nichole [name] was seen today for uri.  Diagnoses and all orders for this visit:  Viral URI with cough Viral URI, no indications of acute bacterial infection. Symptoms greater than 72 hours. Will treat symptomatically with below. Pt aware of symptomatic care at home with adequate fluid intake, rest, and tylenol as needed.  -     brompheniramine-pseudoephedrine-DM 30-2-10 MG/5ML syrup; Take 5 mLs by mouth 4 (four) times daily as needed.    Follow Up Instructions: Return if symptoms worsen or fail to improve.    I discussed the assessment and treatment plan with the patient. The patient was provided an opportunity to ask questions and all were answered. The patient agreed with the plan and demonstrated an understanding of the instructions.   The patient was advised to call back or seek an in-person evaluation if the symptoms worsen or if the condition fails to improve as anticipated.  The above assessment and management plan was discussed with the patient. The patient verbalized understanding of and has agreed to the management plan. Patient is aware to call the clinic if they develop any new symptoms or if symptoms persist or worsen.  Patient is aware when to return to the clinic for a follow-up visit. Patient educated on when it is appropriate to go to the emergency department.    I provided 12 minutes of non-face-to-face time during this encounter. The call started at 1200. The call ended at 1212. The other time was used for coordination of care.    Kari Baars, FNP-C [facility] [address] [place], [place] [address] [phone] 02/08/2021

## 2021-03-09 ENCOUNTER — Telehealth: Payer: Self-pay | Admitting: Family Medicine

## 2021-03-09 NOTE — Telephone Encounter (Signed)
Pt called requesting to be seen soon because she says she needs her Nexplanon checked. Explained to pt that only Dr Dettinger or Dr Nadine Counts could see her for this and there are no appts available so I would have to send a message and see if there is anything we can do.  Pt aware that she will get a call back from nurse.

## 2021-03-09 NOTE — Telephone Encounter (Signed)
Pt c/o rash in inner thighs. The rash has been present for months. Pt wonders if it could be from her Nexplanon. Nexplano was placed about one year ago. Appt scheduled for 1/16 at 3:55

## 2021-03-14 ENCOUNTER — Telehealth: Payer: Self-pay | Admitting: Family Medicine

## 2021-03-14 NOTE — Telephone Encounter (Signed)
I spoke with pt and advised I was able to make her appt a longer appt so that Dr Dettinger would take it out if needed.

## 2021-04-03 ENCOUNTER — Ambulatory Visit (INDEPENDENT_AMBULATORY_CARE_PROVIDER_SITE_OTHER): Payer: Medicaid Other | Admitting: Family Medicine

## 2021-04-03 ENCOUNTER — Encounter: Payer: Self-pay | Admitting: Family Medicine

## 2021-04-03 VITALS — BP 128/74 | HR 99 | Ht 64.0 in | Wt 218.0 lb

## 2021-04-03 DIAGNOSIS — Z3009 Encounter for other general counseling and advice on contraception: Secondary | ICD-10-CM | POA: Diagnosis not present

## 2021-04-03 DIAGNOSIS — L239 Allergic contact dermatitis, unspecified cause: Secondary | ICD-10-CM | POA: Diagnosis not present

## 2021-04-03 DIAGNOSIS — B372 Candidiasis of skin and nail: Secondary | ICD-10-CM | POA: Diagnosis not present

## 2021-04-03 DIAGNOSIS — Z3046 Encounter for surveillance of implantable subdermal contraceptive: Secondary | ICD-10-CM | POA: Diagnosis not present

## 2021-04-03 MED ORDER — NYSTATIN 100000 UNIT/GM EX CREA: 1.0000 "application " | TOPICAL_CREAM | Freq: Two times a day (BID) | CUTANEOUS | 1 refills | Status: AC

## 2021-04-03 MED ORDER — NORGESTIMATE-ETH ESTRADIOL 0.25-35 MG-MCG PO TABS: 1.0000 | ORAL_TABLET | Freq: Every day | ORAL | 11 refills | Status: DC

## 2021-04-03 NOTE — Progress Notes (Signed)
BP 128/74    Pulse 99    Ht '5\' 4"'  (1.626 m)    Wt 218 lb (98.9 kg)    SpO2 98%    BMI 37.42 kg/m    Subjective:   Patient ID: Nichole [name], female    DOB: 2000-06-06, 21 y.o.   MRN: [id number]  HPI: Nichole [name] is a 21 y.o. female presenting on 04/03/2021 for Rash   HPI Rash Patient is coming in with complaints of a rash.  She has been fighting this on and off again rash for the past few months that comes in her groin and her lower abdomen.  Says it will be very itchy and pruritic and burning when she gets it and then it will fade.  She says we did do a short course of prednisone and that helped some but keeps coming back.  She says it is calm down right now but it does come back frequently.  She does feel like there is a possibility is because of her Nexplanon.  She also not like that she does not have a regular cycles and wants to switch to oral birth control pill.  Relevant past medical, surgical, family and social history reviewed and updated as indicated. Interim medical history since our last visit reviewed. Allergies and medications reviewed and updated.  Review of Systems  Constitutional:  Negative for chills and fever.  Eyes:  Negative for visual disturbance.  Respiratory:  Negative for chest tightness and shortness of breath.   Cardiovascular:  Negative for chest pain and leg swelling.  Musculoskeletal:  Negative for back pain and gait problem.  Skin:  Positive for rash.  Neurological:  Negative for light-headedness and headaches.  Psychiatric/Behavioral:  Negative for agitation and behavioral problems.   All other systems reviewed and are negative.  Per HPI unless specifically indicated above   Allergies as of 04/03/2021   No Known Allergies      Medication List        Accurate as of April 03, 2021  4:49 PM. If you have any questions, ask your nurse or doctor.          STOP taking these medications    Nexplanon 68 MG Impl implant Generic  drug: etonogestrel Stopped by: Worthy Rancher, MD   trimethoprim-polymyxin b ophthalmic solution Commonly known as: Polytrim Stopped by: Fransisca Kaufmann Brooklynn Brandenburg, MD       TAKE these medications    albuterol 108 (90 Base) MCG/ACT inhaler Commonly known as: VENTOLIN HFA Inhale 2 puffs into the lungs every 6 (six) hours as needed.   brompheniramine-pseudoephedrine-DM 30-2-10 MG/5ML syrup Take 5 mLs by mouth 4 (four) times daily as needed.   norgestimate-ethinyl estradiol 0.25-35 MG-MCG tablet Commonly known as: Ortho-Cyclen (28) Take 1 tablet by mouth daily. Started by: Fransisca Kaufmann Shanina Kepple, MD   nystatin cream Commonly known as: MYCOSTATIN Apply 1 application topically 2 (two) times daily for 14 days. Started by: Fransisca Kaufmann Oluwatomisin Hustead, MD         Objective:   BP 128/74    Pulse 99    Ht '5\' 4"'  (1.626 m)    Wt 218 lb (98.9 kg)    SpO2 98%    BMI 37.42 kg/m   Wt Readings from Last 3 Encounters:  04/03/21 218 lb (98.9 kg)  09/25/20 200 lb (90.7 kg)  08/12/20 200 lb (90.7 kg) (97 %, Z= 1.94)*   * Growth percentiles are based on CDC (Girls, 2-20 Years) data.  Physical Exam Vitals and nursing note reviewed.  Constitutional:      General: She is not in acute distress.    Appearance: She is well-developed. She is not diaphoretic.  Eyes:     Conjunctiva/sclera: Conjunctivae normal.  Skin:    General: Skin is warm and dry.     Findings: Rash (Slight pink discoloration on the insides of both of her legs and a couple spots in her lower abdomen, not significant right now) present.  Neurological:     Mental Status: She is alert and oriented to person, place, and time.     Coordination: Coordination normal.  Psychiatric:        Behavior: Behavior normal.    Nexplanon removal: Nexplanon is palpable on her left upper inner arm. Site was prepped with Betadine. 22m of 2% lidocaine without epinephrine were used for anesthesia. 15 blade was used to dissect down to 1 and of the  device. Forceps were used to grab the end of the device and extracted which came out intact and whole. Steri-Strips were placed to close the small incision. Pressure dressing was placed over the site to prevent bruising. Patient tolerated procedure well and bleeding was minimal.   Assessment & Plan:   Problem List Items Addressed This Visit       Musculoskeletal and Integument   Allergic dermatitis   Relevant Medications   nystatin cream (MYCOSTATIN)   Other Relevant Orders   CBC with Differential/Platelet   CMP14+EGFR   TSH   Other Visit Diagnoses     Yeast dermatitis    -  Primary   Relevant Medications   nystatin cream (MYCOSTATIN)   Other Relevant Orders   CBC with Differential/Platelet   CMP14+EGFR   TSH   Nexplanon removal       Relevant Medications   norgestimate-ethinyl estradiol (ORTHO-CYCLEN, 28,) 0.25-35 MG-MCG tablet   Birth control counseling       Relevant Medications   norgestimate-ethinyl estradiol (ORTHO-CYCLEN, 28,) 0.25-35 MG-MCG tablet     Removed Nexplanon for the patient, will start on oral birth control pill and give nystatin cream, if not improved from there then we will reevaluate with a rash and possibly dermatology referral6  Follow up plan: Return if symptoms worsen or fail to improve.  Counseling provided for all of the vaccine components Orders Placed This Encounter  Procedures   CBC with Differential/Platelet   CMP14+EGFR   TSH    JCaryl Pina MD [facility] 04/03/2021, 4:49 PM

## 2021-04-04 LAB — CBC WITH DIFFERENTIAL/PLATELET
Basophils Absolute: 0 10*3/uL (ref 0.0–0.2)
Basos: 0 %
EOS (ABSOLUTE): 0.2 10*3/uL (ref 0.0–0.4)
Eos: 2 %
Hematocrit: 40.8 % (ref 34.0–46.6)
Hemoglobin: 13.6 g/dL (ref 11.1–15.9)
Immature Grans (Abs): 0 10*3/uL (ref 0.0–0.1)
Immature Granulocytes: 0 %
Lymphocytes Absolute: 2.8 10*3/uL (ref 0.7–3.1)
Lymphs: 31 %
MCH: 27.9 pg (ref 26.6–33.0)
MCHC: 33.3 g/dL (ref 31.5–35.7)
MCV: 84 fL (ref 79–97)
Monocytes Absolute: 0.6 10*3/uL (ref 0.1–0.9)
Monocytes: 7 %
Neutrophils Absolute: 5.4 10*3/uL (ref 1.4–7.0)
Neutrophils: 60 %
Platelets: 233 10*3/uL (ref 150–450)
RBC: 4.87 x10E6/uL (ref 3.77–5.28)
RDW: 11.9 % (ref 11.7–15.4)
WBC: 9 10*3/uL (ref 3.4–10.8)

## 2021-04-04 LAB — CMP14+EGFR
ALT: 20 IU/L (ref 0–32)
AST: 17 IU/L (ref 0–40)
Albumin/Globulin Ratio: 1.6 (ref 1.2–2.2)
Albumin: 4.1 g/dL (ref 3.9–5.0)
Alkaline Phosphatase: 79 IU/L (ref 42–106)
BUN/Creatinine Ratio: 18 (ref 9–23)
BUN: 11 mg/dL (ref 6–20)
Bilirubin Total: 0.2 mg/dL (ref 0.0–1.2)
CO2: 22 mmol/L (ref 20–29)
Calcium: 9.1 mg/dL (ref 8.7–10.2)
Chloride: 103 mmol/L (ref 96–106)
Creatinine, Ser: 0.62 mg/dL (ref 0.57–1.00)
Globulin, Total: 2.6 g/dL (ref 1.5–4.5)
Glucose: 99 mg/dL (ref 70–99)
Potassium: 4.3 mmol/L (ref 3.5–5.2)
Sodium: 138 mmol/L (ref 134–144)
Total Protein: 6.7 g/dL (ref 6.0–8.5)
eGFR: 131 mL/min/{1.73_m2} (ref >59)

## 2021-04-04 LAB — TSH: TSH: 1.48 u[IU]/mL (ref 0.450–4.500)

## 2021-05-18 IMAGING — US US OB < 14 WEEKS - US OB TV
1 series · 15 of 28 positions shown · non-contrast
Comparison: None.

CLINICAL DATA: Pelvic cramping

EXAM:
OBSTETRIC <14 WK US AND TRANSVAGINAL OB US
TECHNIQUE: Both transabdominal and transvaginal ultrasound examinations were
performed for complete evaluation of the gestation as well as the
maternal uterus, adnexal regions, and pelvic cul-de-sac.
Transvaginal technique was performed to assess early pregnancy.

[Series 1: us ob < 14 weeks - us ob tv · 44 acquisitions, 15 frames shown]
[im 1/44]
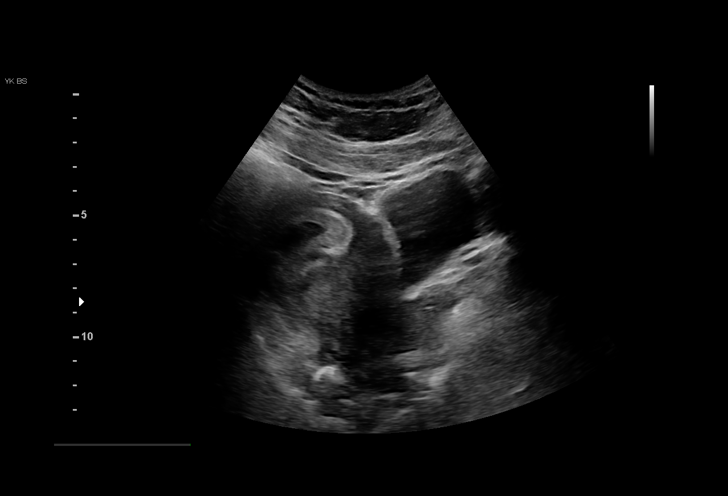
[im 4/44]
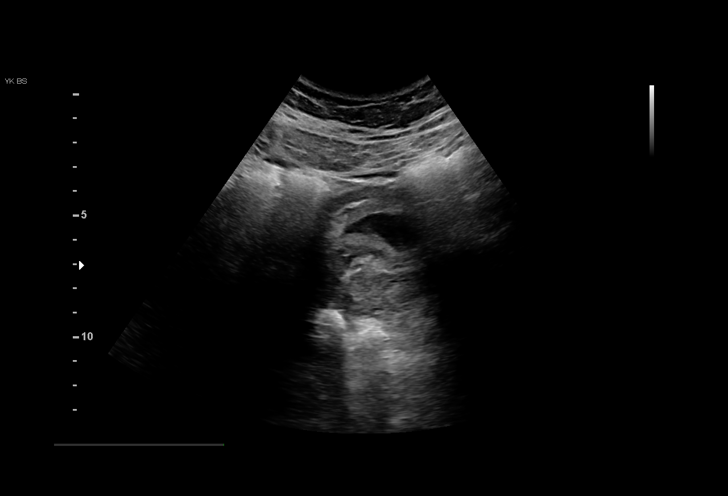
[im 7/44]
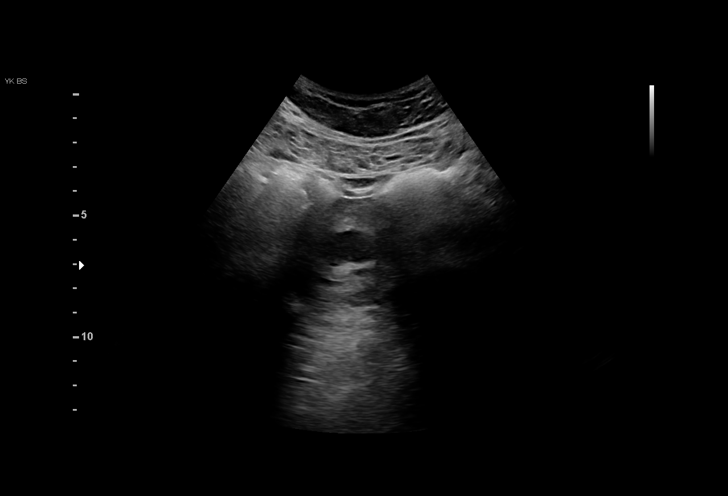
[im 10/44]
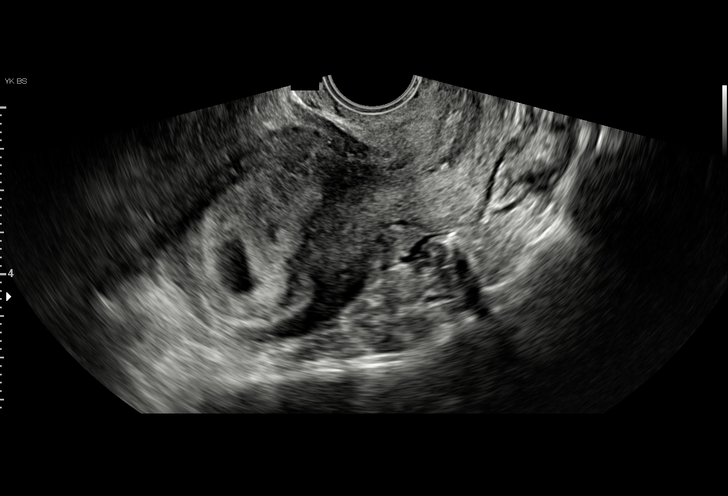
[im 13/44]
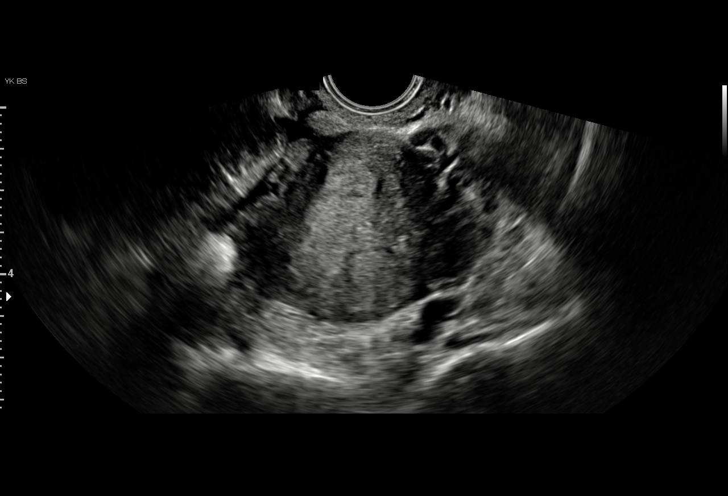
[im 16/44]
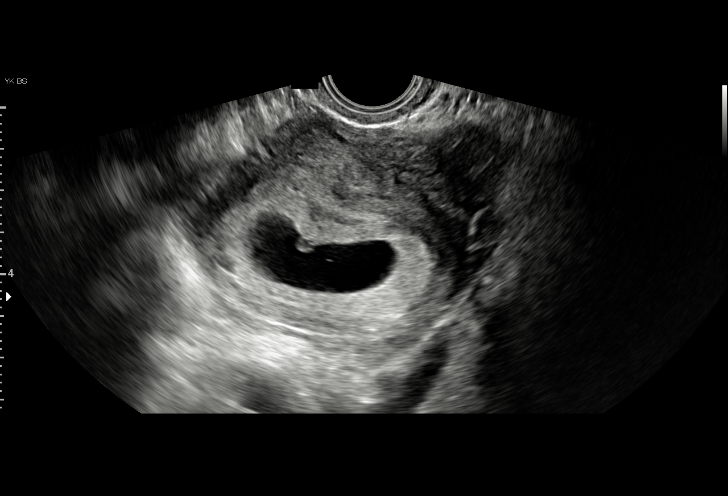
[im 20/44]
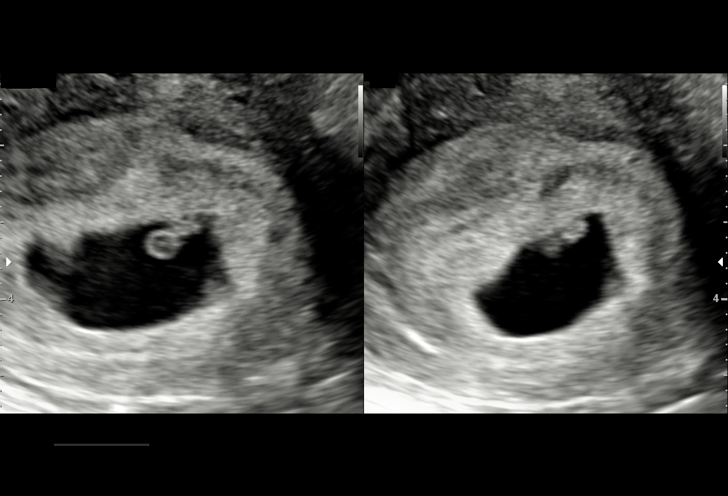
[im 23/44]
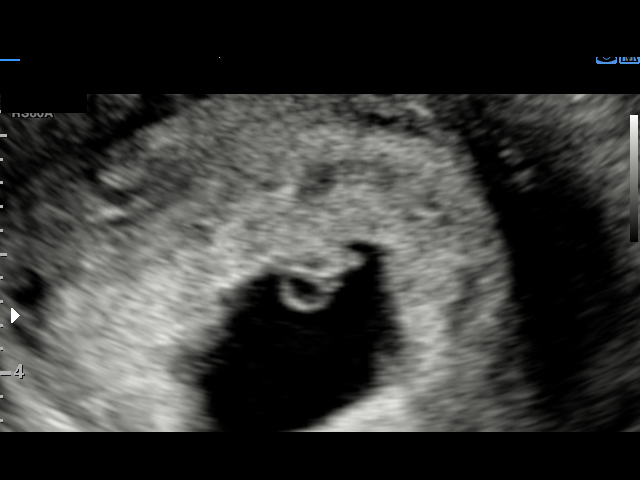
[im 24/44]
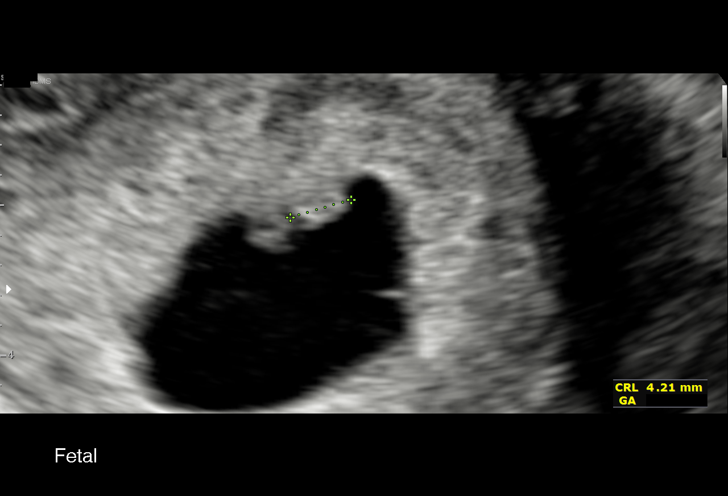
[im 28/44]
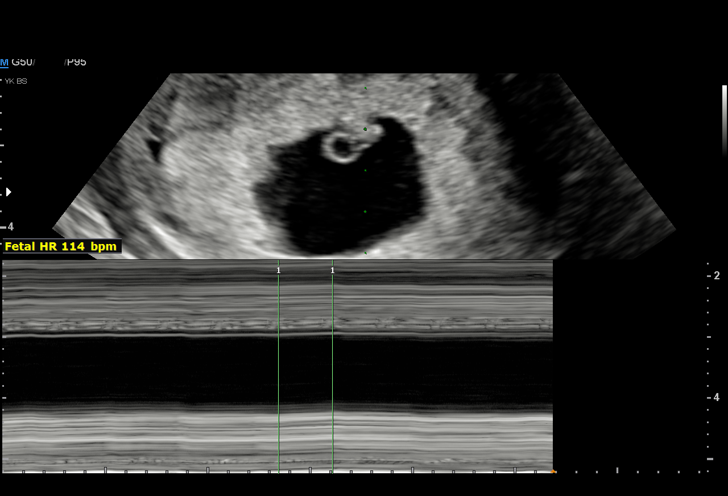
[im 31/44]
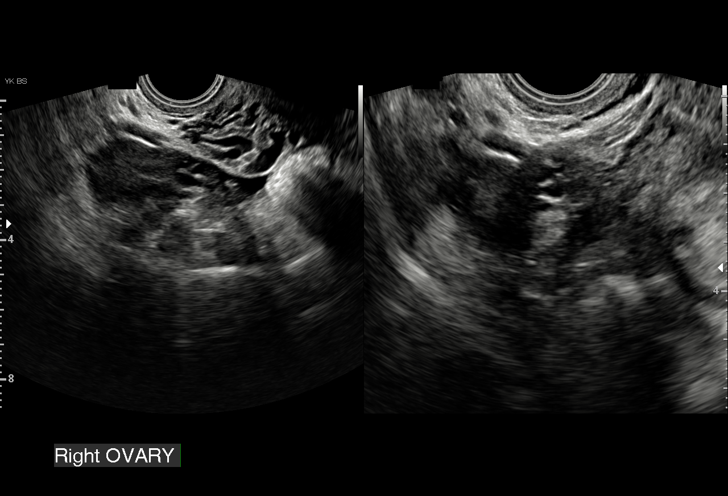
[im 34/44]
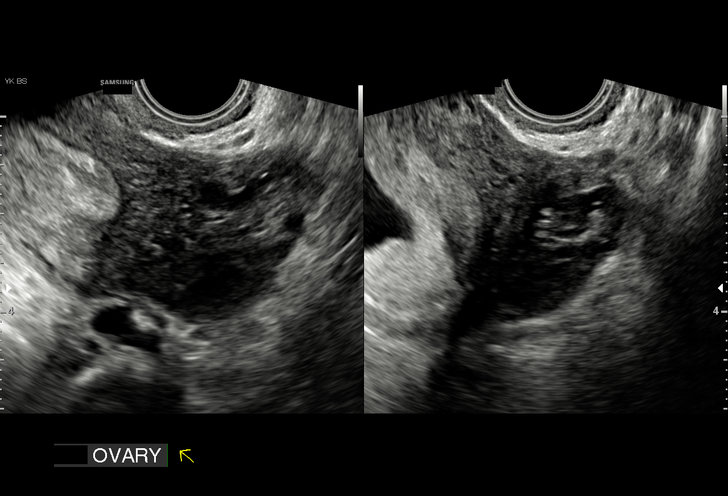
[im 37/44]
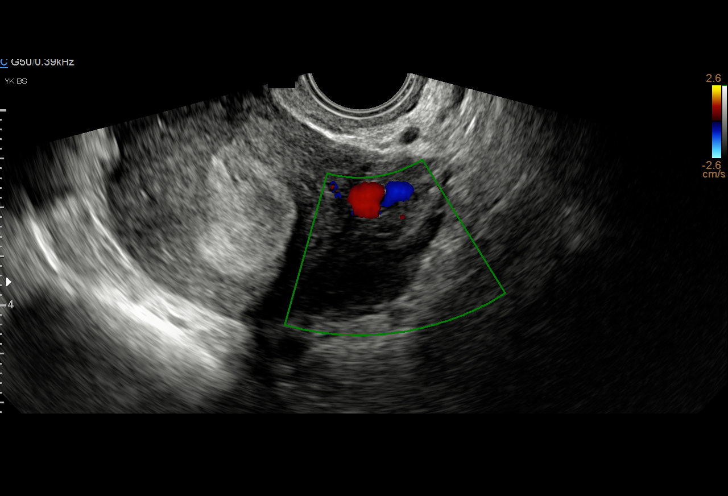
[im 40/44]
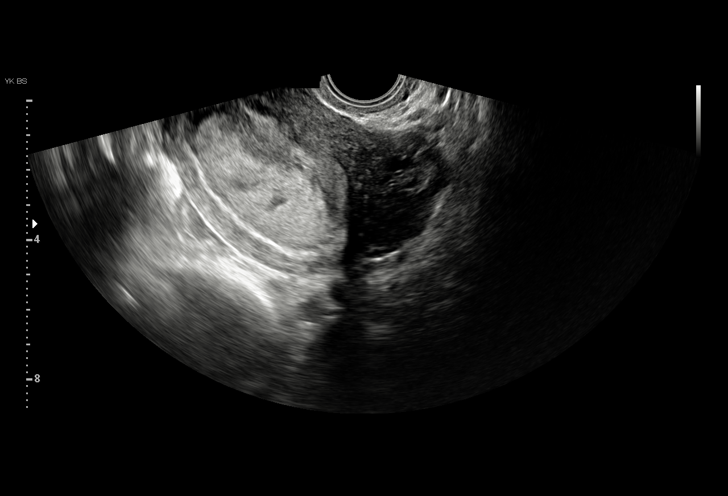
[im 44/44]
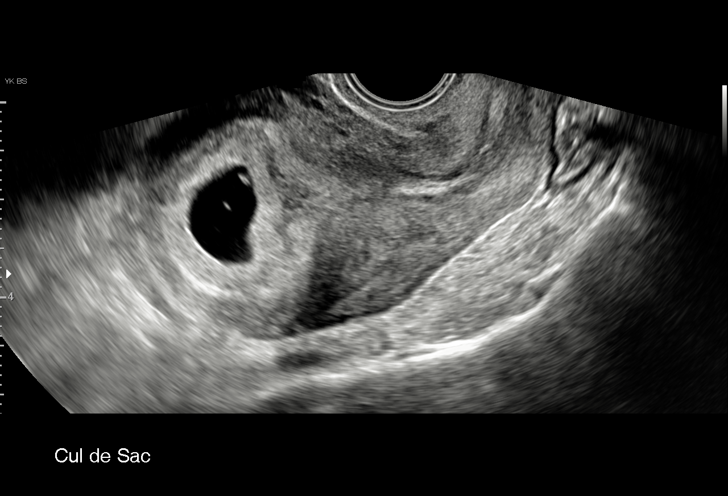

[15 of 28 positions shown; findings below may reference images not displayed]

FINDINGS: Intrauterine gestational sac: Visualized

Yolk sac:  Visualized

Embryo:  Visualized

Cardiac Activity: Visualized

Heart Rate: 118 bpm

CRL:  4 mm   6 w   1 d                  US EDC: April 23, 2020

Subchorionic hemorrhage:  None visualized.

Maternal uterus/adnexae: Cervical os is closed. Right ovary measures
3.0 x 1.8 x 1.7 cm. Left ovary measures 3.8 x 1.8 x 1.7 cm. No
extrauterine pelvic or adnexal mass. No free pelvic fluid.
IMPRESSION: Single live intrauterine gestation with estimated gestational age of
approximately 6 weeks. No evidence subchorionic hemorrhage. Study
otherwise unremarkable.

## 2021-06-21 ENCOUNTER — Ambulatory Visit (INDEPENDENT_AMBULATORY_CARE_PROVIDER_SITE_OTHER): Payer: Medicaid Other | Admitting: Nurse Practitioner

## 2021-06-21 ENCOUNTER — Encounter: Payer: Self-pay | Admitting: Nurse Practitioner

## 2021-06-21 VITALS — BP 116/70 | HR 109 | Temp 98.9°F | Ht 64.0 in | Wt 218.0 lb

## 2021-06-21 DIAGNOSIS — R300 Dysuria: Secondary | ICD-10-CM | POA: Diagnosis not present

## 2021-06-21 DIAGNOSIS — N926 Irregular menstruation, unspecified: Secondary | ICD-10-CM

## 2021-06-21 DIAGNOSIS — N898 Other specified noninflammatory disorders of vagina: Secondary | ICD-10-CM | POA: Diagnosis not present

## 2021-06-21 LAB — MICROSCOPIC EXAMINATION
Bacteria, UA: NONE SEEN
Epithelial Cells (non renal): NONE SEEN /hpf (ref 0–10)
RBC, Urine: NONE SEEN /hpf (ref 0–2)
Renal Epithel, UA: NONE SEEN /hpf
WBC, UA: NONE SEEN /hpf (ref 0–5)

## 2021-06-21 LAB — URINALYSIS, ROUTINE W REFLEX MICROSCOPIC
Bilirubin, UA: NEGATIVE
Glucose, UA: NEGATIVE
Ketones, UA: NEGATIVE
Leukocytes,UA: NEGATIVE
Nitrite, UA: NEGATIVE
RBC, UA: NEGATIVE
Specific Gravity, UA: 1.025 (ref 1.005–1.030)
Urobilinogen, Ur: 0.2 mg/dL (ref 0.2–1.0)
pH, UA: 6 (ref 5.0–7.5)

## 2021-06-21 LAB — WET PREP FOR TRICH, YEAST, CLUE
Clue Cell Exam: NEGATIVE
Trichomonas Exam: NEGATIVE
Yeast Exam: NEGATIVE

## 2021-06-21 LAB — PREGNANCY, URINE: Preg Test, Ur: NEGATIVE

## 2021-06-21 MED ORDER — PHENAZOPYRIDINE HCL 95 MG PO TABS: 95.0000 mg | ORAL_TABLET | Freq: Three times a day (TID) | ORAL | 0 refills | Status: DC | PRN

## 2021-06-21 NOTE — Progress Notes (Signed)
? ?Acute Office Visit ? ?Subjective:  ? ? Patient ID: Nichole [name], female    DOB: 04-Nov-2000, 21 y.o.   MRN: [id number] ? ?Chief Complaint  ?Patient presents with  ? Dysuria  ? ? ?Dysuria  ?This is a new problem. The current episode started yesterday. The problem occurs intermittently. The problem has been unchanged. The quality of the pain is described as aching. The pain is mild. There has been no fever. She is Sexually active. There is No history of pyelonephritis. Pertinent negatives include no chills, discharge, flank pain, nausea or vomiting. She has tried nothing for the symptoms. There is no history of recurrent UTIs.  ? ?Patient also presents with missed.  For a month.  No other signs and symptoms associated with current. ? ? ?Past Medical History:  ?Diagnosis Date  ? Asthma   ? ? ?Past Surgical History:  ?Procedure Laterality Date  ? NO PAST SURGERIES    ? ? ?Family History  ?Problem Relation Age of Onset  ? Diabetes Father   ? ? ?Social History  ? ?Socioeconomic History  ? Marital status: Single  ?  Spouse name: Not on file  ? Number of children: Not on file  ? Years of education: Not on file  ? Highest education level: Not on file  ?Occupational History  ? Not on file  ?Tobacco Use  ? Smoking status: Never  ? Smokeless tobacco: Never  ?Vaping Use  ? Vaping Use: Never used  ?Substance and Sexual Activity  ? Alcohol use: No  ? Drug use: No  ? Sexual activity: Yes  ?  Birth control/protection: None  ?Other Topics Concern  ? Not on file  ?Social History Narrative  ? Not on file  ? ?Social Determinants of Health  ? ?Financial Resource Strain: Not on file  ?Food Insecurity: Not on file  ?Transportation Needs: Not on file  ?Physical Activity: Not on file  ?Stress: Not on file  ?Social Connections: Not on file  ?Intimate Partner Violence: Not on file  ? ? ?Outpatient Medications Prior to Visit  ?Medication Sig Dispense Refill  ? albuterol (VENTOLIN HFA) 108 (90 Base) MCG/ACT inhaler Inhale 2 puffs into the  lungs every 6 (six) hours as needed. 18 g 2  ? norgestimate-ethinyl estradiol (ORTHO-CYCLEN, 28,) 0.25-35 MG-MCG tablet Take 1 tablet by mouth daily. 28 tablet 11  ? brompheniramine-pseudoephedrine-DM 30-2-10 MG/5ML syrup Take 5 mLs by mouth 4 (four) times daily as needed. 120 mL 0  ? ?No facility-administered medications prior to visit.  ? ? ?No Known Allergies ? ?Review of Systems  ?Constitutional:  Negative for chills.  ?HENT: Negative.    ?Eyes: Negative.   ?Respiratory: Negative.    ?Gastrointestinal:  Negative for nausea and vomiting.  ?Genitourinary:  Positive for dysuria. Negative for flank pain.  ?All other systems reviewed and are negative. ? ?   ?Objective:  ?  ?Physical Exam ?Vitals and nursing note reviewed.  ?Constitutional:   ?   Appearance: She is obese.  ?HENT:  ?   Head: Normocephalic.  ?   Right Ear: External ear normal.  ?   Left Ear: External ear normal.  ?   Nose: Nose normal. No congestion.  ?   Mouth/Throat:  ?   Mouth: Mucous membranes are moist.  ?   Pharynx: Oropharynx is clear.  ?Eyes:  ?   Conjunctiva/sclera: Conjunctivae normal.  ?Cardiovascular:  ?   Rate and Rhythm: Normal rate and regular rhythm.  ?Pulmonary:  ?  Effort: Pulmonary effort is normal.  ?   Breath sounds: Normal breath sounds.  ?Abdominal:  ?   General: Bowel sounds are normal.  ?   Tenderness: There is no right CVA tenderness or left CVA tenderness.  ?Musculoskeletal:     ?   General: Normal range of motion.  ?Skin: ?   General: Skin is warm.  ?   Findings: No rash.  ?Neurological:  ?   General: No focal deficit present.  ?   Mental Status: She is alert and oriented to person, place, and time.  ?Psychiatric:     ?   Mood and Affect: Mood normal.     ?   Behavior: Behavior normal.  ? ? ?BP 116/70   Pulse (!) 109   Temp 98.9 ?F (37.2 ?C)   Ht _0  (1.626 m)   Wt 218 lb (98.9 kg)   LMP  (LMP Unknown)   SpO2 97%   BMI 37.42 kg/m?  ?Wt Readings from Last 3 Encounters:  ?06/21/21 218 lb (98.9 kg)  ?04/03/21 218 lb  (98.9 kg)  ?09/25/20 200 lb (90.7 kg)  ? ? ?Health Maintenance Due  ?Topic Date Due  ? COVID-19 Vaccine (1) Never done  ? HPV VACCINES (1 - 2-dose series) Never done  ? CHLAMYDIA SCREENING  03/30/2021  ? ? ?   ?Topic Date Due  ? HPV VACCINES (1 - 2-dose series) Never done  ? ? ? ?Lab Results  ?Component Value Date  ? TSH 1.480 04/03/2021  ? ?Lab Results  ?Component Value Date  ? WBC 9.0 04/03/2021  ? HGB 13.6 04/03/2021  ? HCT 40.8 04/03/2021  ? MCV 84 04/03/2021  ? PLT 233 04/03/2021  ? ?Lab Results  ?Component Value Date  ? NA 138 04/03/2021  ? K 4.3 04/03/2021  ? CO2 22 04/03/2021  ? GLUCOSE 99 04/03/2021  ? BUN 11 04/03/2021  ? CREATININE 0.62 04/03/2021  ? BILITOT 0.2 04/03/2021  ? ALKPHOS 79 04/03/2021  ? AST 17 04/03/2021  ? ALT 20 04/03/2021  ? PROT 6.7 04/03/2021  ? ALBUMIN 4.1 04/03/2021  ? CALCIUM 9.1 04/03/2021  ? ANIONGAP 10 03/02/2020  ? EGFR 131 04/03/2021  ? ?Lab Results  ?Component Value Date  ? HGBA1C 5.3 10/21/2019  ? ? ?   ?Assessment & Plan:  ? ?UTI  vs  interstitial cystitis ?-urinalysis completed results pending ?-pyridium for pain ?-Completed urine pregnancy test for missed menstrual period ?Precaution and education provided ?-All questions answered ?-follow up with unresolved symptoms  ?Problem List Items Addressed This Visit   ?None ?Visit Diagnoses   ? ? Dysuria    -  Primary  ? Relevant Medications  ? phenazopyridine (PYRIDIUM) 95 MG tablet  ? Other Relevant Orders  ? Urinalysis, Routine w reflex microscopic  ? Urine Culture  ? Vaginal discharge      ? Relevant Orders  ? WET PREP FOR [place], YEAST, CLUE  ? Missed period      ? Relevant Orders  ? Pregnancy, urine  ? ?  ? ? ? ?Meds ordered this encounter  ?Medications  ? phenazopyridine (PYRIDIUM) 95 MG tablet  ?  Sig: Take 1 tablet (95 mg total) by mouth 3 (three) times daily as needed for pain.  ?  Dispense:  10 tablet  ?  Refill:  0  ?  Order Specific Question:   Supervising Provider  ?  AnswerClaretta Fraise [[id number]]  ? ? ? ?Ivy Lynn, NP ? ?

## 2021-06-21 NOTE — Patient Instructions (Signed)
Dysuria ?Dysuria is pain or discomfort during urination. The pain or discomfort may be felt in the part of the body that drains urine from the bladder (urethra) or in the surrounding tissue of the genitals. The pain may also be felt in the groin area, lower abdomen, or lower back. ?You may have to urinate frequently or have the sudden feeling that you have to urinate (urgency). Dysuria can affect anyone, but it is more common in females. Dysuria can be caused by many different things, including: ?Urinary tract infection. ?Kidney stones or bladder stones. ?Certain STIs (sexually transmitted infections), such as chlamydia. ?Dehydration. ?Inflammation of the tissues of the vagina. ?Use of certain medicines. ?Use of certain soaps or scented products that cause irritation. ?Follow these instructions at home: ?Medicines ?Take over-the-counter and prescription medicines only as told by your health care provider. ?If you were prescribed an antibiotic medicine, take it as told by your health care provider. Do not stop taking the antibiotic even if you start to feel better. ?Eating and drinking ? ?Drink enough fluid to keep your urine pale yellow. ?Avoid caffeinated beverages, tea, and alcohol. These beverages can irritate the bladder and make dysuria worse. In males, alcohol may irritate the prostate. ?General instructions ?Watch your condition for any changes. ?Urinate often. Avoid holding urine for long periods of time. ?If you are female, you should wipe from front to back after urinating or having a bowel movement. Use each piece of toilet paper only once. ?Empty your bladder after sex. ?Keep all follow-up visits. This is important. ?If you had any tests done to find the cause of dysuria, it is up to you to get your test results. Ask your health care provider, or the department that is doing the test, when your results will be ready. ?Contact a health care provider if: ?You have a fever. ?You develop pain in your back or  sides. ?You have nausea or vomiting. ?You have blood in your urine. ?You are not urinating as often as you usually do. ?Get help right away if: ?Your pain is severe and not relieved with medicines. ?You cannot eat or drink without vomiting. ?You are confused. ?You have a rapid heartbeat while resting. ?You have shaking or chills. ?You feel extremely weak. ?Summary ?Dysuria is pain or discomfort while urinating. Many different conditions can lead to dysuria. ?If you have dysuria, you may have to urinate frequently or have the sudden feeling that you have to urinate (urgency). ?Watch your condition for any changes. Keep all follow-up visits. ?Make sure that you urinate often and drink enough fluid to keep your urine pale yellow. ?This information is not intended to replace advice given to you by your health care provider. Make sure you discuss any questions you have with your health care provider. ?Document Revised: 10/16/2019 Document Reviewed: 10/16/2019 ?Elsevier Patient Education ? 2022 [employer] ? ?

## 2021-06-23 LAB — URINE CULTURE

## 2021-07-29 DIAGNOSIS — L237 Allergic contact dermatitis due to plants, except food: Secondary | ICD-10-CM | POA: Diagnosis not present

## 2021-09-15 ENCOUNTER — Encounter: Payer: Self-pay | Admitting: Nurse Practitioner

## 2021-09-15 ENCOUNTER — Other Ambulatory Visit: Payer: Self-pay | Admitting: Nurse Practitioner

## 2021-09-15 ENCOUNTER — Ambulatory Visit (INDEPENDENT_AMBULATORY_CARE_PROVIDER_SITE_OTHER): Payer: Medicaid Other | Admitting: Nurse Practitioner

## 2021-09-15 ENCOUNTER — Telehealth: Payer: Self-pay | Admitting: Family Medicine

## 2021-09-15 VITALS — BP 110/75 | HR 77 | Ht 64.0 in | Wt 227.0 lb

## 2021-09-15 DIAGNOSIS — M5489 Other dorsalgia: Secondary | ICD-10-CM | POA: Diagnosis not present

## 2021-09-15 DIAGNOSIS — G4489 Other headache syndrome: Secondary | ICD-10-CM

## 2021-09-15 DIAGNOSIS — N926 Irregular menstruation, unspecified: Secondary | ICD-10-CM | POA: Diagnosis not present

## 2021-09-15 LAB — URINALYSIS, ROUTINE W REFLEX MICROSCOPIC
Bilirubin, UA: NEGATIVE
Glucose, UA: NEGATIVE
Ketones, UA: NEGATIVE
Leukocytes,UA: NEGATIVE
Nitrite, UA: NEGATIVE
Protein,UA: NEGATIVE
RBC, UA: NEGATIVE
Specific Gravity, UA: 1.02 (ref 1.005–1.030)
Urobilinogen, Ur: 0.2 mg/dL (ref 0.2–1.0)
pH, UA: 6 (ref 5.0–7.5)

## 2021-09-15 LAB — PREGNANCY, URINE: Preg Test, Ur: NEGATIVE

## 2021-09-15 MED ORDER — TOPIRAMATE 25 MG PO TABS: 25.0000 mg | ORAL_TABLET | Freq: Two times a day (BID) | ORAL | 1 refills | Status: DC

## 2021-09-15 MED ORDER — ACETAMINOPHEN 500 MG PO TABS: 500.0000 mg | ORAL_TABLET | Freq: Four times a day (QID) | ORAL | 0 refills | Status: DC | PRN

## 2021-09-15 NOTE — Progress Notes (Signed)
Acute Office Visit  Subjective:     Patient ID: Nichole [name], female    DOB: 2000/05/08, 21 y.o.   MRN: [id number]  Chief Complaint  Patient presents with   Headache    Pressure     Headache  This is a new problem. The current episode started in the past 7 days. The problem occurs constantly. The problem has been unchanged. The pain is located in the Bilateral region. The pain does not radiate. The pain quality is similar to prior headaches. The quality of the pain is described as aching. The pain is moderate. Associated symptoms include back pain and nausea. Pertinent negatives include no abdominal pain, blurred vision, coughing or sinus pressure.   Patient is in today for   Review of Systems  Constitutional: Negative.   HENT: Negative.  Negative for sinus pressure.   Eyes:  Negative for blurred vision.  Respiratory: Negative.  Negative for cough.   Cardiovascular: Negative.   Gastrointestinal:  Positive for nausea. Negative for abdominal pain.  Musculoskeletal:  Positive for back pain.  Skin: Negative.  Negative for rash.  Neurological:  Positive for headaches.  All other systems reviewed and are negative.       Objective:    BP 110/75   Pulse 77   Ht 5\' 4"  (1.626 m)   Wt 227 lb (103 kg)   LMP 07/24/2021 Comment: not currently on bc  SpO2 100%   BMI 38.96 kg/m  BP Readings from Last 3 Encounters:  09/15/21 110/75  06/21/21 116/70  04/03/21 128/74   Wt Readings from Last 3 Encounters:  09/15/21 227 lb (103 kg)  06/21/21 218 lb (98.9 kg)  04/03/21 218 lb (98.9 kg)      Physical Exam Vitals and nursing note reviewed.  HENT:     Head: Normocephalic.     Right Ear: External ear normal.     Left Ear: External ear normal.     Nose: Nose normal.  Eyes:     Conjunctiva/sclera: Conjunctivae normal.  Cardiovascular:     Rate and Rhythm: Normal rate and regular rhythm.     Heart sounds: Normal heart sounds.  Pulmonary:     Effort: Pulmonary effort is  normal.     Breath sounds: Normal breath sounds.  Abdominal:     General: Bowel sounds are normal.     Palpations: Abdomen is soft.  Musculoskeletal:        General: Normal range of motion.     Lumbar back: Tenderness present.  Skin:    General: Skin is warm and dry.  Neurological:     Mental Status: She is alert and oriented to person, place, and time.  Psychiatric:        Behavior: Behavior normal.     No results found for any visits on 09/15/21.      Assessment & Plan:  New on set head ache in the past few days, symptoms not well controlled, nothing over the counter has helped. Patient has a missed period for the month of June. Completed urine pregnancy and urinalysis test with results pending.to rule out pregnancy and UTI. For head ache, tylenol as needed, increase hydration and rest.  Patient knows to follow up with unresolved symptoms  . Problem List Items Addressed This Visit   None Visit Diagnoses     Back pain without sciatica    -  Primary   Relevant Medications   acetaminophen (TYLENOL) 500 MG tablet   Other Relevant  Orders   Urinalysis, Routine w reflex microscopic   Other headache syndrome       Relevant Medications   acetaminophen (TYLENOL) 500 MG tablet   Missed period       Relevant Orders   Pregnancy, urine       Meds ordered this encounter  Medications   acetaminophen (TYLENOL) 500 MG tablet    Sig: Take 1 tablet (500 mg total) by mouth every 6 (six) hours as needed.    Dispense:  30 tablet    Refill:  0    Order Specific Question:   Supervising Provider    Answer:   Mechele Claude [phone]    Return if symptoms worsen or fail to improve.  Daryll Drown, NP

## 2021-09-15 NOTE — Patient Instructions (Signed)
Tension Headache, Adult A tension headache is a feeling of pain, pressure, or aching over the front and sides of the head. The pain can be dull, or it can feel tight. There are two types of tension headache: Episodic tension headache. This is when the headaches happen fewer than 15 days a month. Chronic tension headache. This is when the headaches happen more than 15 days a month during a 3-month period. A tension headache can last from 30 minutes to several days. It is the most common kind of headache. Tension headaches are not normally associated with nausea or vomiting, and they do not get worse with physical activity. What are the causes? The exact cause of this condition is not known. Tension headaches are often triggered by stress, anxiety, or depression. Other triggers may include: Alcohol. Too much caffeine or caffeine withdrawal. Respiratory infections, such as colds, flu, or sinus infections. Dental problems or teeth clenching. Fatigue. Holding your head and neck in the same position for a long period of time, such as while using a computer. Smoking. Arthritis of the neck. What are the signs or symptoms? Symptoms of this condition include: A feeling of pressure or tightness around the head. Dull, aching head pain. Pain over the front and sides of the head. Tenderness in the muscles of the head, neck, and shoulders. How is this diagnosed? This condition may be diagnosed based on your symptoms, your medical history, and a physical exam. If your symptoms are severe or unusual, you may have imaging tests, such as a CT scan or an MRI of your head. Your vision may also be checked. How is this treated? This condition may be treated with lifestyle changes and with medicines that help relieve symptoms. Follow these instructions at home: Managing pain Take over-the-counter and prescription medicines only as told by your health care provider. When you have a headache, lie down in a dark,  quiet room. If directed, put ice on your head and neck. To do this: Put ice in a plastic bag. Place a towel between your skin and the bag. Leave the ice on for 20 minutes, 2-3 times a day. Remove the ice if your skin turns bright red. This is very important. If you cannot feel pain, heat, or cold, you have a greater risk of damage to the area. If directed, apply heat to the back of your neck as often as told by your health care provider. Use the heat source that your health care provider recommends, such as a moist heat pack or a heating pad. Place a towel between your skin and the heat source. Leave the heat on for 20-30 minutes. Remove the heat if your skin turns bright red. This is especially important if you are unable to feel pain, heat, or cold. You have a greater risk of getting burned. Eating and drinking Eat meals on a regular schedule. If you drink alcohol: Limit how much you have to: 0-1 drink a day for women who are not pregnant. 0-2 drinks a day for men. Know how much alcohol is in your drink. In the [place] one drink equals one 12 oz bottle of beer (355 mL), one 5 oz glass of wine (148 mL), or one 1 oz glass of hard liquor (44 mL). Drink enough fluid to keep your urine pale yellow. Decrease your caffeine intake, or stop using caffeine. Lifestyle Get 7-9 hours of sleep each night, or get the amount of sleep recommended by your health care provider. At bedtime,   remove computers, phones, and tablets from your room. Find ways to manage your stress. This may include: Exercise. Deep breathing exercises. Yoga. Listening to music. Positive mental imagery. Try to sit up straight and avoid tensing your muscles. Do not use any products that contain nicotine or tobacco. These include cigarettes, chewing tobacco, and vaping devices, such as e-cigarettes. If you need help quitting, ask your health care provider. General instructions  Avoid any headache triggers. Keep a journal to help  find out what may trigger your headaches. For example, write down: What you eat and drink. How much sleep you get. Any change to your diet or medicines. Keep all follow-up visits. This is important. Contact a health care provider if: Your headache does not get better. Your headache comes back. You are sensitive to sounds, light, or smells because of a headache. You have nausea or you vomit. Your stomach hurts. Get help right away if: You suddenly develop a severe headache, along with any of the following: A stiff neck. Nausea and vomiting. Confusion. Weakness in one part or one side of your body. Double vision or loss of vision. Shortness of breath. Rash. Unusual sleepiness. Fever or chills. Trouble speaking. Pain in your eye or ear. Trouble walking or balancing. Feeling faint or passing out. Summary A tension headache is a feeling of pain, pressure, or aching over the front and sides of the head. A tension headache can last from 30 minutes to several days. It is the most common kind of headache. This condition may be diagnosed based on your symptoms, your medical history, and a physical exam. This condition may be treated with lifestyle changes and with medicines that help relieve symptoms. This information is not intended to replace advice given to you by your health care provider. Make sure you discuss any questions you have with your health care provider. Document Revised: 12/03/2019 Document Reviewed: 12/03/2019 Elsevier Patient Education  2023 Elsevier Inc.  

## 2021-09-15 NOTE — Telephone Encounter (Signed)
Patient came in office to let us know that [facility] in [place] had not received medicine for her. She was unsure of what was getting sent in but thought it was Tylenol.

## 2021-09-15 NOTE — Telephone Encounter (Signed)
Spoke with Davina at [facility] and patient has picked medication up.

## 2021-10-24 ENCOUNTER — Ambulatory Visit: Payer: Medicaid Other | Admitting: Family Medicine

## 2021-10-24 ENCOUNTER — Other Ambulatory Visit (HOSPITAL_COMMUNITY)
Admission: RE | Admit: 2021-10-24 | Discharge: 2021-10-24 | Disposition: A | Discharge status: Home / Self Care | Payer: Medicaid Other | Source: Ambulatory Visit | Attending: Family Medicine | Admitting: Family Medicine

## 2021-10-24 ENCOUNTER — Encounter: Payer: Self-pay | Admitting: Family Medicine

## 2021-10-24 VITALS — BP 111/70 | HR 103 | Temp 99.2°F | Ht 64.0 in | Wt 229.0 lb

## 2021-10-24 DIAGNOSIS — N898 Other specified noninflammatory disorders of vagina: Secondary | ICD-10-CM | POA: Insufficient documentation

## 2021-10-24 DIAGNOSIS — O2311 Infections of bladder in pregnancy, first trimester: Secondary | ICD-10-CM

## 2021-10-24 DIAGNOSIS — R300 Dysuria: Secondary | ICD-10-CM

## 2021-10-24 DIAGNOSIS — Z3201 Encounter for pregnancy test, result positive: Secondary | ICD-10-CM

## 2021-10-24 LAB — MICROSCOPIC EXAMINATION: Renal Epithel, UA: NONE SEEN /hpf

## 2021-10-24 LAB — PREGNANCY, URINE: Preg Test, Ur: POSITIVE — AB

## 2021-10-24 LAB — URINALYSIS, ROUTINE W REFLEX MICROSCOPIC
Bilirubin, UA: NEGATIVE
Glucose, UA: NEGATIVE
Ketones, UA: NEGATIVE
Leukocytes,UA: NEGATIVE
Nitrite, UA: NEGATIVE
Protein,UA: NEGATIVE
Specific Gravity, UA: 1.02 (ref 1.005–1.030)
Urobilinogen, Ur: 0.2 mg/dL (ref 0.2–1.0)
pH, UA: 5.5 (ref 5.0–7.5)

## 2021-10-24 NOTE — Progress Notes (Signed)
Subjective:  Patient ID: Nichole [name], female    DOB: 2000-08-15, 21 y.o.   MRN: [id number]  Patient Care Team: Dettinger, Fransisca Kaufmann, MD as PCP - General (Family Medicine)   Chief Complaint:  Dysuria (X1 week)   HPI: Nichole [name] is a 21 y.o. female presenting on 10/24/2021 for Dysuria (X1 week)   Pt presents today for evaluation of dysuria and vaginal discharge. States she has tried some over the counter medications for the dysuria and increased her water intake with resolution of symptoms. She states she has a slight vaginal discharge, no odor and clear to yellow in color. No vaginal discomfort or bleeding. She is sexually active and can not recall when her LMP was. States she is not using contraceptive.   Dysuria  This is a new problem. The current episode started 1 to 4 weeks ago. The problem has been resolved. The quality of the pain is described as burning. The patient is experiencing no pain. There has been no fever. She is Sexually active. There is No history of pyelonephritis. Associated symptoms include a discharge and a possible pregnancy. Pertinent negatives include no chills, flank pain, frequency, hematuria, hesitancy, nausea, sweats, urgency or vomiting. She has tried increased fluids for the symptoms. The treatment provided significant relief.    Relevant past medical, surgical, family, and social history reviewed and updated as indicated.  Allergies and medications reviewed and updated. Data reviewed: Chart in Epic.   Past Medical History:  Diagnosis Date   Asthma     Past Surgical History:  Procedure Laterality Date   NO PAST SURGERIES      Social History   Socioeconomic History   Marital status: Single    Spouse name: Not on file   Number of children: Not on file   Years of education: Not on file   Highest education level: Not on file  Occupational History   Not on file  Tobacco Use   Smoking status: Never   Smokeless tobacco: Never   Vaping Use   Vaping Use: Never used  Substance and Sexual Activity   Alcohol use: No   Drug use: No   Sexual activity: Yes    Birth control/protection: None  Other Topics Concern   Not on file  Social History Narrative   Not on file   Social Determinants of Health   Financial Resource Strain: Medium Risk (06/07/2020)   Overall Financial Resource Strain (CARDIA)    Difficulty of Paying Living Expenses: Somewhat hard  Food Insecurity: No Food Insecurity (06/07/2020)   Hunger Vital Sign    Worried About Running Out of Food in the Last Year: Never true    Ran Out of Food in the Last Year: Never true  Transportation Needs: No Transportation Needs (06/07/2020)   PRAPARE - Hydrologist (Medical): No    Lack of Transportation (Non-Medical): No  Physical Activity: Sufficiently Active (06/07/2020)   Exercise Vital Sign    Days of Exercise per Week: 6 days    Minutes of Exercise per Session: 30 min  Stress: No Stress Concern Present (06/07/2020)   [place]    Feeling of Stress : Only a little  Social Connections: Socially Integrated (06/07/2020)   Social Connection and Isolation Panel [NHANES]    Frequency of Communication with Friends and Family: Three times a week    Frequency of Social Gatherings with Friends and Family: Once  a week    Attends Religious Services: More than 4 times per year    Active Member of Clubs or Organizations: Yes    Attends Banker Meetings: Never    Marital Status: Married  Catering manager Violence: Not At Risk (06/07/2020)   Humiliation, Afraid, Rape, and Kick questionnaire    Fear of Current or Ex-Partner: No    Emotionally Abused: No    Physically Abused: No    Sexually Abused: No    Outpatient Encounter Medications as of 10/24/2021  Medication Sig   albuterol (VENTOLIN HFA) 108 (90 Base) MCG/ACT inhaler Inhale 2 puffs into the lungs every 6  (six) hours as needed.   [DISCONTINUED] acetaminophen (TYLENOL) 500 MG tablet Take 1 tablet (500 mg total) by mouth every 6 (six) hours as needed.   [DISCONTINUED] phenazopyridine (PYRIDIUM) 95 MG tablet Take 1 tablet (95 mg total) by mouth 3 (three) times daily as needed for pain.   [DISCONTINUED] topiramate (TOPAMAX) 25 MG tablet Take 1 tablet (25 mg total) by mouth 2 (two) times daily.   No facility-administered encounter medications on file as of 10/24/2021.    No Known Allergies  Review of Systems  Constitutional:  Negative for activity change, appetite change, chills, diaphoresis, fatigue, fever and unexpected weight change.  HENT: Negative.    Eyes: Negative.   Respiratory:  Negative for cough, chest tightness and shortness of breath.   Cardiovascular:  Negative for chest pain, palpitations and leg swelling.  Gastrointestinal:  Negative for abdominal pain, blood in stool, constipation, diarrhea, nausea and vomiting.  Endocrine: Negative.   Genitourinary:  Positive for dysuria and vaginal discharge. Negative for decreased urine volume, difficulty urinating, dyspareunia, enuresis, flank pain, frequency, genital sores, hematuria, hesitancy, pelvic pain, urgency, vaginal bleeding and vaginal pain.  Musculoskeletal:  Negative for arthralgias and myalgias.  Skin: Negative.   Allergic/Immunologic: Negative.   Neurological:  Negative for dizziness, weakness and headaches.  Hematological: Negative.   Psychiatric/Behavioral:  Negative for confusion, hallucinations, sleep disturbance and suicidal ideas.   All other systems reviewed and are negative.       Objective:  BP 111/70   Pulse (!) 103   Temp 99.2 F (37.3 C)   Ht 5\' 4"  (1.626 m)   Wt 229 lb (103.9 kg)   LMP 09/23/2021 (Approximate)   SpO2 97%   BMI 39.31 kg/m    Wt Readings from Last 3 Encounters:  10/24/21 229 lb (103.9 kg)  09/15/21 227 lb (103 kg)  06/21/21 218 lb (98.9 kg)    Physical Exam Vitals and nursing  note reviewed.  Constitutional:      General: She is not in acute distress.    Appearance: Normal appearance. She is obese. She is not ill-appearing, toxic-appearing or diaphoretic.  HENT:     Head: Normocephalic and atraumatic.     Mouth/Throat:     Mouth: Mucous membranes are moist.  Eyes:     Conjunctiva/sclera: Conjunctivae normal.     Pupils: Pupils are equal, round, and reactive to light.  Cardiovascular:     Rate and Rhythm: Normal rate and regular rhythm.     Heart sounds: Normal heart sounds. No murmur heard.    No friction rub. No gallop.  Pulmonary:     Effort: Pulmonary effort is normal.     Breath sounds: Normal breath sounds.  Abdominal:     General: Bowel sounds are normal. There is no distension.     Palpations: Abdomen is soft.  Tenderness: There is no abdominal tenderness. There is no left CVA tenderness.  Musculoskeletal:     Right lower leg: No edema.     Left lower leg: No edema.  Skin:    General: Skin is warm and dry.     Capillary Refill: Capillary refill takes less than 2 seconds.  Neurological:     General: No focal deficit present.     Mental Status: She is alert and oriented to person, place, and time.  Psychiatric:        Mood and Affect: Mood normal.        Behavior: Behavior normal.        Thought Content: Thought content normal.        Judgment: Judgment normal.     Results for orders placed or performed in visit on 09/15/21  Urinalysis, Routine w reflex microscopic  Result Value Ref Range   Specific Gravity, UA 1.020 1.005 - 1.030   pH, UA 6.0 5.0 - 7.5   Color, UA Yellow Yellow   Appearance Ur Clear Clear   Leukocytes,UA Negative Negative   Protein,UA Negative Negative/Trace   Glucose, UA Negative Negative   Ketones, UA Negative Negative   RBC, UA Negative Negative   Bilirubin, UA Negative Negative   Urobilinogen, Ur 0.2 0.2 - 1.0 mg/dL   Nitrite, UA Negative Negative  Pregnancy, urine  Result Value Ref Range   Preg Test, Ur  Negative Negative       Pertinent labs & imaging results that were available during my care of the patient were reviewed by me and considered in my medical decision making.  Assessment & Plan:  Falen was seen today for dysuria.  Diagnoses and all orders for this visit:  Dysuria Vaginal discharge STI testing pending. Urine pregnancy in office positive. Urinalysis unremarkable in office, culture pending.  -     Urinalysis, Routine w reflex microscopic -     Urine Culture -     Pregnancy, urine -     Urine cytology ancillary only  Positive pregnancy test Pt unsure of LMP. Pt to make follow up with OB/GYN for evaluation and management of pregnancy.     Continue all other maintenance medications.  Follow up plan: Return if symptoms worsen or fail to improve.   Continue healthy lifestyle choices, including diet (rich in fruits, vegetables, and lean proteins, and low in salt and simple carbohydrates) and exercise (at least 30 minutes of moderate physical activity daily).  Educational handout given for first trimester pregnancy  The above assessment and management plan was discussed with the patient. The patient verbalized understanding of and has agreed to the management plan. Patient is aware to call the clinic if they develop any new symptoms or if symptoms persist or worsen. Patient is aware when to return to the clinic for a follow-up visit. Patient educated on when it is appropriate to go to the emergency department.   Kari Baars, FNP-C [facility] [phone]

## 2021-10-25 LAB — URINE CYTOLOGY ANCILLARY ONLY
Bacterial Vaginitis-Urine: NEGATIVE
Candida Urine: NEGATIVE
Chlamydia: NEGATIVE
Comment: NEGATIVE
Comment: NEGATIVE
Comment: NORMAL
Neisseria Gonorrhea: NEGATIVE
Trichomonas: NEGATIVE

## 2021-10-26 ENCOUNTER — Other Ambulatory Visit: Payer: Self-pay

## 2021-10-26 DIAGNOSIS — O2311 Infections of bladder in pregnancy, first trimester: Secondary | ICD-10-CM

## 2021-10-26 LAB — URINE CULTURE

## 2021-10-26 MED ORDER — AMOXICILLIN-POT CLAVULANATE 875-125 MG PO TABS: 1.0000 | ORAL_TABLET | Freq: Two times a day (BID) | ORAL | 0 refills | Status: DC

## 2021-10-26 MED ORDER — AMOXICILLIN-POT CLAVULANATE 875-125 MG PO TABS: 1.0000 | ORAL_TABLET | Freq: Two times a day (BID) | ORAL | 0 refills | Status: AC

## 2021-10-26 NOTE — Addendum Note (Signed)
Addended by: Sonny Masters on: 10/26/2021 08:05 AM   Modules accepted: Orders

## 2021-11-16 ENCOUNTER — Telehealth: Payer: Self-pay | Admitting: Family Medicine

## 2021-11-16 NOTE — Telephone Encounter (Signed)
Over-the-Counter Medications Safe to Take During Pregnancy Pain Relief Tylenol or acetaminophen (plain/extra strength) for mild discomfort Caution: do not take aspirin (Anacin, Bayer) or ibuprofen (Advil, Motrin).  Medicine for Digestive Upsets Antacids (Tums, Rolaids, Mylanta, Maalox, Pepcid, Prevacid) Simethicone (Gas-X, Mylicon for gas pain, Gaviscon) Immodium or BRAT diet (bananas, rice, applesauce, toast or tea) for diarrhea  Medicine for Coughs/Colds Guaifenesin (Robitussin) Guaifenesin plus dextromethorphan (Robitussin-DM) Cough drops Vicks VapoRub Acetaminophen  Allergy Relief Tylenol or acetaminophen (plain/extra strength) is OK Chlorpheniramine antihistamine alone (chlor-Trimetron) Benadryl tablets Saline nasal spray Neti-pot or sinus rinse Claritin, Zyrtec, Allegra  Options for Constipation Fiber can be used regularly (Metamucil, MiraLax, Citrucel, BeneFiber) Laxatives can be used occasionally (Colace, Dulcolax) Tucks for hemorrhoids Mix of Prune juice, OJ, 7-UP - equal parts    There is a small list of medicines he can take during pregnancy, I would stick mostly with the medicines for coughs and colds and allergy relief and those should be able to help you with your sore throat.

## 2021-11-16 NOTE — Telephone Encounter (Signed)
Patient aware and verbalized understanding. Patient has appt set up with obgyn on 09/06. Patient is asking for something to be sent in for nausea and vomit. Please advise

## 2021-11-16 NOTE — Telephone Encounter (Signed)
Have her try over-the-counter doxylamine/Unisom and vitamin B6, that is commonly what they recommend to take every day in the evening to help with early nausea in pregnancy, if that does not work then there are stronger agents but they recommend to use this first

## 2021-11-16 NOTE — Telephone Encounter (Signed)
Pt informed and will call back if needed. 

## 2021-11-19 IMAGING — DX DG CHEST 1V PORT
1 series · 1 of 1 positions shown · non-contrast
Comparison: Chest radiographs 08/01/2016 and earlier.

CLINICAL DATA: 19-year-old female with shortness of breath.

EXAM:
PORTABLE CHEST 1 VIEW

[chest]
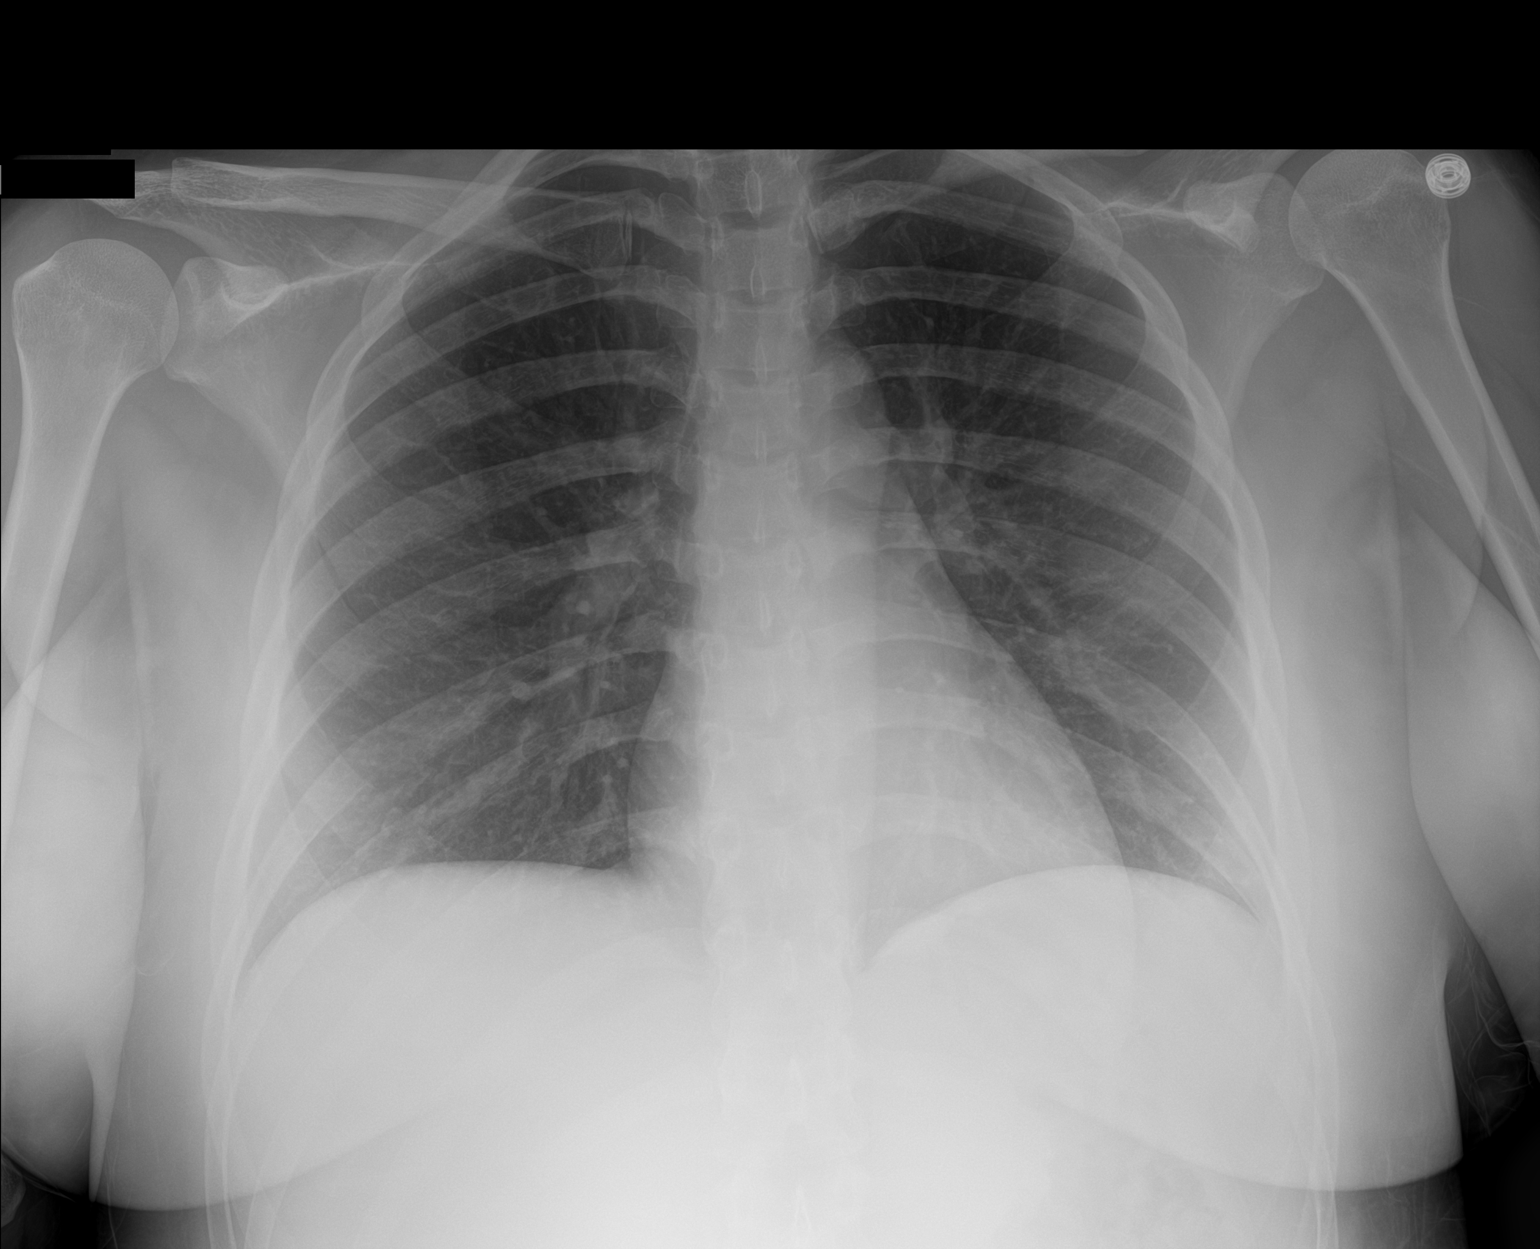

[1 of 1 positions shown; findings below may reference images not displayed]

FINDINGS: Portable AP upright view at 7860 hours. Lung volumes and mediastinal
contours remain normal. Visualized tracheal air column is within
normal limits. Allowing for portable technique the lungs are clear.
No pneumothorax or pleural effusion.

No osseous abnormality identified. Paucity of bowel gas in the upper
abdomen.
IMPRESSION: Negative portable chest.

## 2021-11-21 ENCOUNTER — Other Ambulatory Visit: Payer: Self-pay | Admitting: Obstetrics & Gynecology

## 2021-11-21 DIAGNOSIS — O3680X0 Pregnancy with inconclusive fetal viability, not applicable or unspecified: Secondary | ICD-10-CM

## 2021-11-22 ENCOUNTER — Ambulatory Visit (INDEPENDENT_AMBULATORY_CARE_PROVIDER_SITE_OTHER): Payer: Medicaid Other

## 2021-11-22 DIAGNOSIS — O3680X0 Pregnancy with inconclusive fetal viability, not applicable or unspecified: Secondary | ICD-10-CM

## 2021-11-22 NOTE — Progress Notes (Signed)
[name] 9+1 wks,single IUP with yolk sac,CRL 23.76 mm,FHR 166 bpm,normal ovaries

## 2021-12-05 ENCOUNTER — Encounter: Payer: Self-pay | Admitting: Nurse Practitioner

## 2021-12-05 ENCOUNTER — Ambulatory Visit: Payer: Medicaid Other | Admitting: Nurse Practitioner

## 2021-12-05 VITALS — BP 109/72 | HR 111 | Temp 98.7°F | Ht 64.0 in | Wt 260.0 lb

## 2021-12-05 DIAGNOSIS — R300 Dysuria: Secondary | ICD-10-CM

## 2021-12-05 MED ORDER — AMOXICILLIN-POT CLAVULANATE 875-125 MG PO TABS: 1.0000 | ORAL_TABLET | Freq: Two times a day (BID) | ORAL | 0 refills | Status: DC

## 2021-12-05 NOTE — Progress Notes (Signed)
   Acute Office Visit  Subjective:     Patient ID: Nichole [name], female    DOB: 05/23/2000, 21 y.o.   MRN: [id number]  No chief complaint on file.   Dysuria  This is a new problem. The current episode started yesterday. The problem occurs intermittently. The problem has been unchanged. The quality of the pain is described as aching. The pain is mild. There has been no fever. She is Sexually active. Pertinent negatives include no chills, flank pain or hematuria. She has tried nothing for the symptoms.  Patient is 2 months pregnant   Review of Systems  Constitutional: Negative.  Negative for chills.  HENT: Negative.    Eyes: Negative.   Respiratory: Negative.    Cardiovascular: Negative.   Genitourinary:  Positive for dysuria. Negative for flank pain and hematuria.  Skin: Negative.  Negative for rash.  All other systems reviewed and are negative.       Objective:    BP 109/72   Pulse (!) 111   Temp 98.7 F (37.1 C)   Ht 5\' 4"  (1.626 m)   Wt 260 lb (117.9 kg)   LMP  (LMP Unknown)   SpO2 96%   BMI 44.63 kg/m  BP Readings from Last 3 Encounters:  12/05/21 109/72  10/24/21 111/70  09/15/21 110/75      Physical Exam Vitals and nursing note reviewed.  Constitutional:      Appearance: Normal appearance.  HENT:     Head: Normocephalic.     Nose: Nose normal.  Eyes:     Conjunctiva/sclera: Conjunctivae normal.  Cardiovascular:     Rate and Rhythm: Normal rate and regular rhythm.  Pulmonary:     Effort: Pulmonary effort is normal.     Breath sounds: Normal breath sounds.  Abdominal:     General: Bowel sounds are normal.     Tenderness: There is abdominal tenderness. There is no right CVA tenderness or left CVA tenderness.  Skin:    General: Skin is warm.     Findings: No erythema or rash.  Neurological:     General: No focal deficit present.     Mental Status: She is alert and oriented to person, place, and time.  Psychiatric:        Behavior: Behavior  normal.     No results found for any visits on 12/05/21.      Assessment & Plan:   UTI  vs  interstitial cystitis -urinalysis completed results pending -pyridium for pain Precaution and education provided -All questions answered -follow up with unresolved symptoms  Problem List Items Addressed This Visit   None Visit Diagnoses     Dysuria    -  Primary   Relevant Medications   amoxicillin-clavulanate (AUGMENTIN) 875-125 MG tablet   Other Relevant Orders   Urine Culture   Urinalysis, Complete       Meds ordered this encounter  Medications   amoxicillin-clavulanate (AUGMENTIN) 875-125 MG tablet    Sig: Take 1 tablet by mouth 2 (two) times daily.    Dispense:  10 tablet    Refill:  0    Order Specific Question:   Supervising Provider    Answer:   Claretta Fraise [phone]    Return if symptoms worsen or fail to improve.  Ivy Lynn, NP

## 2021-12-05 NOTE — Patient Instructions (Signed)

## 2021-12-06 ENCOUNTER — Other Ambulatory Visit: Payer: Self-pay | Admitting: *Deleted

## 2021-12-06 DIAGNOSIS — R0602 Shortness of breath: Secondary | ICD-10-CM

## 2021-12-06 LAB — URINALYSIS, COMPLETE
Bilirubin, UA: NEGATIVE
Glucose, UA: NEGATIVE
Leukocytes,UA: NEGATIVE
Nitrite, UA: NEGATIVE
RBC, UA: NEGATIVE
Specific Gravity, UA: 1.02 (ref 1.005–1.030)
Urobilinogen, Ur: 1 mg/dL (ref 0.2–1.0)
pH, UA: 7.5 (ref 5.0–7.5)

## 2021-12-06 MED ORDER — ALBUTEROL SULFATE HFA 108 (90 BASE) MCG/ACT IN AERS: 2.0000 | INHALATION_SPRAY | Freq: Four times a day (QID) | RESPIRATORY_TRACT | 2 refills | Status: DC | PRN

## 2021-12-07 LAB — URINE CULTURE

## 2021-12-18 ENCOUNTER — Other Ambulatory Visit: Payer: Self-pay | Admitting: Obstetrics & Gynecology

## 2021-12-18 ENCOUNTER — Encounter: Payer: Self-pay | Admitting: Women's Health

## 2021-12-18 DIAGNOSIS — Z3490 Encounter for supervision of normal pregnancy, unspecified, unspecified trimester: Secondary | ICD-10-CM | POA: Insufficient documentation

## 2021-12-18 DIAGNOSIS — Z3682 Encounter for antenatal screening for nuchal translucency: Secondary | ICD-10-CM

## 2021-12-19 ENCOUNTER — Other Ambulatory Visit (HOSPITAL_COMMUNITY)
Admission: RE | Admit: 2021-12-19 | Discharge: 2021-12-19 | Disposition: A | Discharge status: Home / Self Care | Payer: Medicaid Other | Source: Ambulatory Visit | Attending: Women's Health | Admitting: Women's Health

## 2021-12-19 ENCOUNTER — Ambulatory Visit: Payer: Medicaid Other | Admitting: *Deleted

## 2021-12-19 ENCOUNTER — Ambulatory Visit (INDEPENDENT_AMBULATORY_CARE_PROVIDER_SITE_OTHER): Payer: Medicaid Other | Admitting: Women's Health

## 2021-12-19 ENCOUNTER — Ambulatory Visit (INDEPENDENT_AMBULATORY_CARE_PROVIDER_SITE_OTHER): Payer: Medicaid Other

## 2021-12-19 ENCOUNTER — Encounter: Payer: Self-pay | Admitting: Women's Health

## 2021-12-19 VITALS — BP 108/72 | HR 81 | Wt 218.6 lb

## 2021-12-19 DIAGNOSIS — Z3480 Encounter for supervision of other normal pregnancy, unspecified trimester: Secondary | ICD-10-CM

## 2021-12-19 DIAGNOSIS — Z6841 Body Mass Index (BMI) 40.0 and over, adult: Secondary | ICD-10-CM

## 2021-12-19 DIAGNOSIS — Z3481 Encounter for supervision of other normal pregnancy, first trimester: Secondary | ICD-10-CM | POA: Insufficient documentation

## 2021-12-19 DIAGNOSIS — Z3A13 13 weeks gestation of pregnancy: Secondary | ICD-10-CM

## 2021-12-19 DIAGNOSIS — Z124 Encounter for screening for malignant neoplasm of cervix: Secondary | ICD-10-CM | POA: Diagnosis not present

## 2021-12-19 DIAGNOSIS — Z3682 Encounter for antenatal screening for nuchal translucency: Secondary | ICD-10-CM

## 2021-12-19 DIAGNOSIS — Z113 Encounter for screening for infections with a predominantly sexual mode of transmission: Secondary | ICD-10-CM | POA: Insufficient documentation

## 2021-12-19 LAB — POCT URINALYSIS DIPSTICK OB
Blood, UA: NEGATIVE
Glucose, UA: NEGATIVE
Ketones, UA: NEGATIVE
Nitrite, UA: NEGATIVE
POC,PROTEIN,UA: NEGATIVE

## 2021-12-19 NOTE — Patient Instructions (Signed)
Nichole [name], thank you for choosing our office today! We appreciate the opportunity to meet your healthcare needs. You may receive a short survey by mail, e-mail, or through [employer]. If you are happy with your care we would appreciate if you could take just a few minutes to complete the survey questions. We read all of your comments and take your feedback very seriously. Thank you again for choosing our office.  [facility] at [place] [facility] ([place], [place] [id number]) Entrance C, located off of [address] parking   Nausea & Vomiting Have saltine crackers or pretzels by your bed and eat a few bites before you raise your head out of bed in the morning Eat small frequent meals throughout the day instead of large meals Drink plenty of fluids throughout the day to stay hydrated, just don't drink a lot of fluids with your meals.  This can make your stomach fill up faster making you feel sick Do not brush your teeth right after you eat Products with real ginger are good for nausea, like ginger ale and ginger hard candy Make sure it says made with real ginger! Sucking on sour candy like lemon heads is also good for nausea If your prenatal vitamins make you nauseated, take them at night so you will sleep through the nausea Sea Bands If you feel like you need medicine for the nausea & vomiting please let us know If you are unable to keep any fluids or food down please let us know   Constipation Drink plenty of fluid, preferably water, throughout the day Eat foods high in fiber such as fruits, vegetables, and grains Exercise, such as walking, is a good way to keep your bowels regular Drink warm fluids, especially warm prune juice, or decaf coffee Eat a 1/2 cup of real oatmeal (not instant), 1/2 cup applesauce, and 1/2-1 cup warm prune juice every day If needed, you may take Colace (docusate sodium) stool  softener once or twice a day to help keep the stool soft.  If you still are having problems with constipation, you may take Miralax once daily as needed to help keep your bowels regular.   Home Blood Pressure Monitoring for Patients   Your provider has recommended that you check your blood pressure (BP) at least once a week at home. If you do not have a blood pressure cuff at home, one will be provided for you. Contact your provider if you have not received your monitor within 1 week.   Helpful Tips for Accurate Home Blood Pressure Checks  Don't smoke, exercise, or drink caffeine 30 minutes before checking your BP Use the restroom before checking your BP (a full bladder can raise your pressure) Relax in a comfortable upright chair Feet on the ground Left arm resting comfortably on a flat surface at the level of your heart Legs uncrossed Back supported Sit quietly and don't talk Place the cuff on your bare arm Adjust snuggly, so that only two fingertips can fit between your skin and the top of the cuff Check 2 readings separated by at least one minute Keep a log of your BP readings For a visual, please reference this diagram: http://ccnc.care/bpdiagram  Provider Name: [facility]     Phone: [phone]  Zone 1: ALL CLEAR  Continue to monitor your symptoms:  BP reading is less than 140 (top number) or less than 90 (bottom  number)  No right upper stomach pain No headaches or seeing spots No feeling nauseated or throwing up No swelling in face and hands  Zone 2: CAUTION Call your doctor's office for any of the following:  BP reading is greater than 140 (top number) or greater than 90 (bottom number)  Stomach pain under your ribs in the middle or right side Headaches or seeing spots Feeling nauseated or throwing up Swelling in face and hands  Zone 3: EMERGENCY  Seek immediate medical care if you have any of the following:  BP reading is greater than160 (top number) or  greater than 110 (bottom number) Severe headaches not improving with Tylenol Serious difficulty catching your breath Any worsening symptoms from Zone 2    First Trimester of Pregnancy The first trimester of pregnancy is from week 1 until the end of week 12 (months 1 through 3). A week after a sperm fertilizes an egg, the egg will implant on the wall of the uterus. This embryo will begin to develop into a baby. Genes from you and your partner are forming the baby. The female genes determine whether the baby is a boy or a girl. At 6-8 weeks, the eyes and face are formed, and the heartbeat can be seen on ultrasound. At the end of 12 weeks, all the baby's organs are formed.  Now that you are pregnant, you will want to do everything you can to have a healthy baby. Two of the most important things are to get good prenatal care and to follow your health care provider's instructions. Prenatal care is all the medical care you receive before the baby's birth. This care will help prevent, find, and treat any problems during the pregnancy and childbirth. BODY CHANGES Your body goes through many changes during pregnancy. The changes vary from woman to woman.  You may gain or lose a couple of pounds at first. You may feel sick to your stomach (nauseous) and throw up (vomit). If the vomiting is uncontrollable, call your health care provider. You may tire easily. You may develop headaches that can be relieved by medicines approved by your health care provider. You may urinate more often. Painful urination may mean you have a bladder infection. You may develop heartburn as a result of your pregnancy. You may develop constipation because certain hormones are causing the muscles that push waste through your intestines to slow down. You may develop hemorrhoids or swollen, bulging veins (varicose veins). Your breasts may begin to grow larger and become tender. Your nipples may stick out more, and the tissue that  surrounds them (areola) may become darker. Your gums may bleed and may be sensitive to brushing and flossing. Dark spots or blotches (chloasma, mask of pregnancy) may develop on your face. This will likely fade after the baby is born. Your menstrual periods will stop. You may have a loss of appetite. You may develop cravings for certain kinds of food. You may have changes in your emotions from day to day, such as being excited to be pregnant or being concerned that something may go wrong with the pregnancy and baby. You may have more vivid and strange dreams. You may have changes in your hair. These can include thickening of your hair, rapid growth, and changes in texture. Some women also have hair loss during or after pregnancy, or hair that feels dry or thin. Your hair will most likely return to normal after your baby is born. WHAT TO EXPECT AT YOUR PRENATAL  VISITS During a routine prenatal visit: You will be weighed to make sure you and the baby are growing normally. Your blood pressure will be taken. Your abdomen will be measured to track your baby's growth. The fetal heartbeat will be listened to starting around week 10 or 12 of your pregnancy. Test results from any previous visits will be discussed. Your health care provider may ask you: How you are feeling. If you are feeling the baby move. If you have had any abnormal symptoms, such as leaking fluid, bleeding, severe headaches, or abdominal cramping. If you have any questions. Other tests that may be performed during your first trimester include: Blood tests to find your blood type and to check for the presence of any previous infections. They will also be used to check for low iron levels (anemia) and Rh antibodies. Later in the pregnancy, blood tests for diabetes will be done along with other tests if problems develop. Urine tests to check for infections, diabetes, or protein in the urine. An ultrasound to confirm the proper growth  and development of the baby. An amniocentesis to check for possible genetic problems. Fetal screens for spina bifida and Down syndrome. You may need other tests to make sure you and the baby are doing well. HOME CARE INSTRUCTIONS  Medicines Follow your health care provider's instructions regarding medicine use. Specific medicines may be either safe or unsafe to take during pregnancy. Take your prenatal vitamins as directed. If you develop constipation, try taking a stool softener if your health care provider approves. Diet Eat regular, well-balanced meals. Choose a variety of foods, such as meat or vegetable-based protein, fish, milk and low-fat dairy products, vegetables, fruits, and whole grain breads and cereals. Your health care provider will help you determine the amount of weight gain that is right for you. Avoid raw meat and uncooked cheese. These carry germs that can cause birth defects in the baby. Eating four or five small meals rather than three large meals a day may help relieve nausea and vomiting. If you start to feel nauseous, eating a few soda crackers can be helpful. Drinking liquids between meals instead of during meals also seems to help nausea and vomiting. If you develop constipation, eat more high-fiber foods, such as fresh vegetables or fruit and whole grains. Drink enough fluids to keep your urine clear or pale yellow. Activity and Exercise Exercise only as directed by your health care provider. Exercising will help you: Control your weight. Stay in shape. Be prepared for labor and delivery. Experiencing pain or cramping in the lower abdomen or low back is a good sign that you should stop exercising. Check with your health care provider before continuing normal exercises. Try to avoid standing for long periods of time. Move your legs often if you must stand in one place for a long time. Avoid heavy lifting. Wear low-heeled shoes, and practice good posture. You may  continue to have sex unless your health care provider directs you otherwise. Relief of Pain or Discomfort Wear a good support bra for breast tenderness.   Take warm sitz baths to soothe any pain or discomfort caused by hemorrhoids. Use hemorrhoid cream if your health care provider approves.   Rest with your legs elevated if you have leg cramps or low back pain. If you develop varicose veins in your legs, wear support hose. Elevate your feet for 15 minutes, 3-4 times a day. Limit salt in your diet. Prenatal Care Schedule your prenatal visits by the  twelfth week of pregnancy. They are usually scheduled monthly at first, then more often in the last 2 months before delivery. Write down your questions. Take them to your prenatal visits. Keep all your prenatal visits as directed by your health care provider. Safety Wear your seat belt at all times when driving. Make a list of emergency phone numbers, including numbers for family, friends, the hospital, and police and fire departments. General Tips Ask your health care provider for a referral to a local prenatal education class. Begin classes no later than at the beginning of month 6 of your pregnancy. Ask for help if you have counseling or nutritional needs during pregnancy. Your health care provider can offer advice or refer you to specialists for help with various needs. Do not use hot tubs, steam rooms, or saunas. Do not douche or use tampons or scented sanitary pads. Do not cross your legs for long periods of time. Avoid cat litter boxes and soil used by cats. These carry germs that can cause birth defects in the baby and possibly loss of the fetus by miscarriage or stillbirth. Avoid all smoking, herbs, alcohol, and medicines not prescribed by your health care provider. Chemicals in these affect the formation and growth of the baby. Schedule a dentist appointment. At home, brush your teeth with a soft toothbrush and be gentle when you floss. SEEK  MEDICAL CARE IF:  You have dizziness. You have mild pelvic cramps, pelvic pressure, or nagging pain in the abdominal area. You have persistent nausea, vomiting, or diarrhea. You have a bad smelling vaginal discharge. You have pain with urination. You notice increased swelling in your face, hands, legs, or ankles. SEEK IMMEDIATE MEDICAL CARE IF:  You have a fever. You are leaking fluid from your vagina. You have spotting or bleeding from your vagina. You have severe abdominal cramping or pain. You have rapid weight gain or loss. You vomit blood or material that looks like coffee grounds. You are exposed to [name] measles and have never had them. You are exposed to fifth disease or chickenpox. You develop a severe headache. You have shortness of breath. You have any kind of trauma, such as from a fall or a car accident. Document Released: 02/27/2001 Document Revised: 07/20/2013 Document Reviewed: 01/13/2013 [facility] Patient Information 2015 [place], [place]. This information is not intended to replace advice given to you by your health care provider. Make sure you discuss any questions you have with your health care provider.

## 2021-12-19 NOTE — Progress Notes (Signed)
[name] 13 wks,measurements c/w dates,CRL 67.55 mm,NB present,NT 1.6 mm,FHR 154 bpm,normal ovaries,posterior placenta

## 2021-12-19 NOTE — Progress Notes (Signed)
INITIAL OBSTETRICAL VISIT Patient name: Nichole [name] MRN [id number]  Date of birth: 29-Nov-2000 Chief Complaint:   Initial Prenatal Visit  History of Present Illness:   Nichole [name] is a 21 y.o. G73P1001 Hispanic female at [redacted]w[redacted]d by [name] at 9 weeks with an Estimated Date of Delivery: 06/26/22 being seen today for her initial obstetrical visit.   No LMP recorded (lmp unknown). Patient is pregnant. Her obstetrical history is significant for  term SVB x 1 w/ PPH d/t atony, EBL 1922ml, given TXA, cytotec and pit .   Today she reports nausea- declines meds Last pap never. Results were: N/A     12/19/2021    3:21 PM 10/24/2021   10:19 AM 09/15/2021   11:17 AM 06/21/2021   11:53 AM 04/03/2021    3:49 PM  Depression screen PHQ 2/9  Decreased Interest 1 0 0 1 0  Down, Depressed, Hopeless 0 0 0 0 0  PHQ - 2 Score 1 0 0 1 0  Altered sleeping 1 0  1   Tired, decreased energy 1 0  1   Change in appetite 1 0  1   Feeling bad or failure about yourself  0 0  0   Trouble concentrating 0 0  0   Moving slowly or fidgety/restless 0 0  0   Suicidal thoughts 0 0  0   PHQ-9 Score 4 0  4   Difficult doing work/chores  Not difficult at all           12/05/2021   11:42 AM 10/24/2021   10:19 AM 09/15/2021   11:17 AM 06/21/2021   11:54 AM  GAD 7 : Generalized Anxiety Score  Nervous, Anxious, on Edge 0 0 0 0  Control/stop worrying 0 0 0 0  Worry too much - different things 0 0 0 0  Trouble relaxing 0 0 0 0  Restless 0 0 0 0  Easily annoyed or irritable 0 0 0 0  Afraid - awful might happen 0 0 0 0  Total GAD 7 Score 0 0 0 0  Anxiety Difficulty  Not difficult at all Not difficult at all Not difficult at all     Review of Systems:   Pertinent items are noted in HPI Denies cramping/contractions, leakage of fluid, vaginal bleeding, abnormal vaginal discharge w/ itching/odor/irritation, headaches, visual changes, shortness of breath, chest pain, abdominal pain, severe nausea/vomiting, or problems  with urination or bowel movements unless otherwise stated above.  Pertinent History Reviewed:  Reviewed past medical,surgical, social, obstetrical and family history.  Reviewed problem list, medications and allergies. OB History  Gravida Para Term Preterm AB Living  2 1 1     1   SAB IAB Ectopic Multiple Live Births        0 1    # Outcome Date GA Lbr Len/2nd Weight Sex Delivery Anes PTL Lv  2 Current           1 Term 04/26/20 [redacted]w[redacted]d 01:24 / 00:18 6 lb 8.2 oz (2.954 kg) M Vag-Spont None N LIV     Birth Comments: WNL   Physical Assessment:   Vitals:   12/19/21 1403  BP: 108/72  Pulse: 81  Weight: 218 lb 9.6 oz (99.2 kg)  Body mass index is 37.52 kg/m.       Physical Examination:  General appearance - well appearing, and in no distress  Mental status - alert, oriented to person, place, and time  Psych:  She has  a normal mood and affect  Skin - warm and dry, normal color, no suspicious lesions noted  Chest - effort normal, all lung fields clear to auscultation bilaterally  Heart - normal rate and regular rhythm  Abdomen - soft, nontender  Extremities:  No swelling or varicosities noted  Pelvic - VULVA: normal appearing vulva with no masses, tenderness or lesions  VAGINA: normal appearing vagina with normal color and discharge, no lesions  CERVIX: normal appearing cervix without discharge or lesions, no CMT  Thin prep pap is done w/ reflex HR HPV cotesting  Chaperone: Andrez Grime    TODAY'S NT [name] 13 wks,measurements c/w dates,CRL 67.55 mm,NB present,NT 1.6 mm,FHR 154 bpm,normal ovaries,posterior placenta   Results for orders placed or performed in visit on 12/19/21 (from the past 24 hour(s))  POC Urinalysis Dipstick OB   Collection Time: 12/19/21  3:45 PM  Result Value Ref Range   Color, UA     Clarity, UA     Glucose, UA Negative Negative   Bilirubin, UA     Ketones, UA neg    Spec Grav, UA     Blood, UA neg    pH, UA     POC,PROTEIN,UA Negative Negative, Trace,  Small (1+), Moderate (2+), Large (3+), 4+   Urobilinogen, UA     Nitrite, UA neg    Leukocytes, UA Small (1+) (A) Negative   Appearance     Odor      Assessment & Plan:  1) Low-Risk Pregnancy G2P1001 at [redacted]w[redacted]d with an Estimated Date of Delivery: 06/26/22   2) Initial OB visit  3) H/O PPH  4) CF carrier> FOB declined testing  5) Nausea> declines meds, tips given  Meds: No orders of the defined types were placed in this encounter.   Initial labs obtained Continue prenatal vitamins Reviewed n/v relief measures and warning s/s to report Reviewed recommended weight gain based on pre-gravid BMI Encouraged well-balanced diet Genetic & carrier screening discussed: requests Panorama and NT/IT, had Horizon last pregnancy Ultrasound discussed; fetal survey: requested [place] completed> form faxed if has or is planning to apply for medicaid The nature of [place] for [employer] with multiple MDs and other Advanced Practice Providers was explained to patient; also emphasized that fellows, residents, and students are part of our team. Does have home bp cuff. Office bp cuff given: no. Rx sent: no. Check bp weekly, let us know if consistently >140/90.   Indications for early A1C (per uptodate) BMI >=25 (>=23 in Asian women) AND one of the following High-risk race/ethnicity (eg, African American, Latino, Native American, [place] American, [place]) Yes  Follow-up: Return in about 3 weeks (around 01/09/2022) for [place], 2nd IT, CNM, in person; then 6wks from now for anatomy [place].   Orders Placed This Encounter  Procedures   Urine Culture   Integrated 1   CBC/D/Plt+RPR+Rh+ABO+RubIgG...   Hemoglobin A1c   Panorama Prenatal Test Full Panel   POC Urinalysis Dipstick OB    Roma Schanz CNM, [facility] 12/19/2021 4:07 PM

## 2021-12-21 LAB — CBC/D/PLT+RPR+RH+ABO+RUBIGG...
Antibody Screen: NEGATIVE
Basophils Absolute: 0 10*3/uL (ref 0.0–0.2)
Basos: 0 %
EOS (ABSOLUTE): 0.2 10*3/uL (ref 0.0–0.4)
Eos: 3 %
HCV Ab: NONREACTIVE
HIV Screen 4th Generation wRfx: NONREACTIVE
Hematocrit: 40.9 % (ref 34.0–46.6)
Hemoglobin: 13.9 g/dL (ref 11.1–15.9)
Hepatitis B Surface Ag: NEGATIVE
Immature Grans (Abs): 0 10*3/uL (ref 0.0–0.1)
Immature Granulocytes: 0 %
Lymphocytes Absolute: 2.7 10*3/uL (ref 0.7–3.1)
Lymphs: 33 %
MCH: 28.5 pg (ref 26.6–33.0)
MCHC: 34 g/dL (ref 31.5–35.7)
MCV: 84 fL (ref 79–97)
Monocytes Absolute: 0.5 10*3/uL (ref 0.1–0.9)
Monocytes: 6 %
Neutrophils Absolute: 4.7 10*3/uL (ref 1.4–7.0)
Neutrophils: 58 %
Platelets: 211 10*3/uL (ref 150–450)
RBC: 4.87 x10E6/uL (ref 3.77–5.28)
RDW: 12.4 % (ref 11.7–15.4)
RPR Ser Ql: NONREACTIVE
Rh Factor: POSITIVE
Rubella Antibodies, IGG: 1.03 index (ref >0.99)
WBC: 8.2 10*3/uL (ref 3.4–10.8)

## 2021-12-21 LAB — INTEGRATED 1
Crown Rump Length: 67.6 mm
Gest. Age on Collection Date: 12.9 weeks
Maternal Age at EDD: 21.9 yr
Nuchal Translucency (NT): 1.6 mm
Number of Fetuses: 1
PAPP-A Value: 540.2 ng/mL
Weight: 219 [lb_av]

## 2021-12-21 LAB — HCV INTERPRETATION

## 2021-12-21 LAB — HEMOGLOBIN A1C
Est. average glucose Bld gHb Est-mCnc: 105 mg/dL
Hgb A1c MFr Bld: 5.3 % (ref 4.8–5.6)

## 2021-12-22 LAB — CYTOLOGY - PAP
Amendment: NEGATIVE
Chlamydia: NEGATIVE
Comment: NEGATIVE
Comment: NORMAL
Diagnosis: NEGATIVE
Neisseria Gonorrhea: NEGATIVE

## 2021-12-25 ENCOUNTER — Encounter: Payer: Self-pay | Admitting: Women's Health

## 2021-12-25 LAB — PANORAMA PRENATAL TEST FULL PANEL:PANORAMA TEST PLUS 5 ADDITIONAL MICRODELETIONS: FETAL FRACTION: 8.6

## 2022-01-09 ENCOUNTER — Ambulatory Visit (INDEPENDENT_AMBULATORY_CARE_PROVIDER_SITE_OTHER): Payer: Medicaid Other | Admitting: Women's Health

## 2022-01-09 ENCOUNTER — Encounter: Payer: Self-pay | Admitting: Women's Health

## 2022-01-09 VITALS — BP 108/68 | HR 82 | Wt 214.6 lb

## 2022-01-09 DIAGNOSIS — Z363 Encounter for antenatal screening for malformations: Secondary | ICD-10-CM

## 2022-01-09 DIAGNOSIS — Z1379 Encounter for other screening for genetic and chromosomal anomalies: Secondary | ICD-10-CM

## 2022-01-09 DIAGNOSIS — Z3A16 16 weeks gestation of pregnancy: Secondary | ICD-10-CM

## 2022-01-09 DIAGNOSIS — Z1332 Encounter for screening for maternal depression: Secondary | ICD-10-CM

## 2022-01-09 DIAGNOSIS — F17200 Nicotine dependence, unspecified, uncomplicated: Secondary | ICD-10-CM

## 2022-01-09 DIAGNOSIS — Z3482 Encounter for supervision of other normal pregnancy, second trimester: Secondary | ICD-10-CM

## 2022-01-09 NOTE — Progress Notes (Signed)
LOW-RISK PREGNANCY VISIT Patient name: Nichole [name] MRN [id number]  Date of birth: 28-Apr-2000 Chief Complaint:   Routine Prenatal Visit  History of Present Illness:   Nichole [name] is a 21 y.o. G18P1001 female at [redacted]w[redacted]d with an Estimated Date of Delivery: 06/26/22 being seen today for ongoing management of a low-risk pregnancy.   Today she reports  some mild cramps . Contractions: Not present. Vag. Bleeding: None.  Movement: Present. denies leaking of fluid.     12/19/2021    3:21 PM 10/24/2021   10:19 AM 09/15/2021   11:17 AM 06/21/2021   11:53 AM 04/03/2021    3:49 PM  Depression screen PHQ 2/9  Decreased Interest 1 0 0 1 0  Down, Depressed, Hopeless 0 0 0 0 0  PHQ - 2 Score 1 0 0 1 0  Altered sleeping 1 0  1   Tired, decreased energy 1 0  1   Change in appetite 1 0  1   Feeling bad or failure about yourself  0 0  0   Trouble concentrating 0 0  0   Moving slowly or fidgety/restless 0 0  0   Suicidal thoughts 0 0  0   PHQ-9 Score 4 0  4   Difficult doing work/chores  Not difficult at all           12/05/2021   11:42 AM 10/24/2021   10:19 AM 09/15/2021   11:17 AM 06/21/2021   11:54 AM  GAD 7 : Generalized Anxiety Score  Nervous, Anxious, on Edge 0 0 0 0  Control/stop worrying 0 0 0 0  Worry too much - different things 0 0 0 0  Trouble relaxing 0 0 0 0  Restless 0 0 0 0  Easily annoyed or irritable 0 0 0 0  Afraid - awful might happen 0 0 0 0  Total GAD 7 Score 0 0 0 0  Anxiety Difficulty  Not difficult at all Not difficult at all Not difficult at all      Review of Systems:   Pertinent items are noted in HPI Denies abnormal vaginal discharge w/ itching/odor/irritation, headaches, visual changes, shortness of breath, chest pain, abdominal pain, severe nausea/vomiting, or problems with urination or bowel movements unless otherwise stated above. Pertinent History Reviewed:  Reviewed past medical,surgical, social, obstetrical and family history.  Reviewed problem  list, medications and allergies. Physical Assessment:   Vitals:   01/09/22 1509  BP: 108/68  Pulse: 82  Weight: 214 lb 9.6 oz (97.3 kg)  Body mass index is 36.84 kg/m.        Physical Examination:   General appearance: Well appearing, and in no distress  Mental status: Alert, oriented to person, place, and time  Skin: Warm & dry  Cardiovascular: Normal heart rate noted  Respiratory: Normal respiratory effort, no distress  Abdomen: Soft, gravid, nontender  Pelvic: Cervical exam deferred         Extremities: Edema: None  Fetal Status: Fetal Heart Rate (bpm): 148   Movement: Present    Chaperone: N/A   No results found for this or any previous visit (from the past 24 hour(s)).  Assessment & Plan:  1) Low-risk pregnancy G2P1001 at [redacted]w[redacted]d with an Estimated Date of Delivery: 06/26/22     Meds: No orders of the defined types were placed in this encounter.  Labs/procedures today: 2nd IT  Plan:  Continue routine obstetrical care  Next visit: prefers will be in person for u/s  Reviewed: Preterm labor symptoms and general obstetric precautions including but not limited to vaginal bleeding, contractions, leaking of fluid and fetal movement were reviewed in detail with the patient.  All questions were answered. Does have home bp cuff. Office bp cuff given: not applicable. Check bp weekly, let us know if consistently >140 and/or >90.  Follow-up: Return for As scheduled.  Future Appointments  Date Time Provider Department Center  01/30/2022  3:00 PM [facility] [name] [facility] None  01/30/2022  3:50 PM Myna Hidalgo, DO [facility]    Orders Placed This Encounter  Procedures   INTEGRATED 2   Cheral Marker CNM, [facility] 01/09/2022 3:30 PM

## 2022-01-09 NOTE — Patient Instructions (Signed)
Nichole [name], thank you for choosing our office today! We appreciate the opportunity to meet your healthcare needs. You may receive a short survey by mail, e-mail, or through [employer]. If you are happy with your care we would appreciate if you could take just a few minutes to complete the survey questions. We read all of your comments and take your feedback very seriously. Thank you again for choosing our office.  Center for [employer] Team at [place] at [facility] ([place], [place] [address]) Entrance C, located off of [address] parking  Go to [email or web address] to register for FREE online childbirth classes  Call the office [phone]) or go to [facility] if: You begin to severe cramping Your water breaks.  Sometimes it is a big gush of fluid, sometimes it is just a trickle that keeps getting your panties wet or running down your legs You have vaginal bleeding.  It is normal to have a small amount of spotting if your cervix was checked.   [facility] Pediatricians/Family Doctors [facility]): [address] [place], [place] [facility]: [address] [place], [phone]                [facility]): [place], [phone] (call to ask if accepting patients) [facility]: [address], [place], [place] Pediatricians/Family Doctors [facility]): [place]. [place], [address], [facility]: [address] [place], [place] [facility]: [place], [place] [facility]): [phone] [facility]: [address] [place]: [address] [facility]: [phone], [phone]  Home Blood Pressure Monitoring for Patients   Your provider has recommended that you check your blood pressure (BP) at least once a week at home. If you do not have a blood pressure cuff at home, one will be provided for you. Contact your provider if you have not received your monitor within 1 week.   Helpful Tips for Accurate Home Blood Pressure Checks  Don't smoke, exercise, or drink caffeine 30 minutes before checking your BP Use the restroom before checking your BP (a full bladder can raise your pressure) Relax in a comfortable upright chair Feet on the ground Left arm resting comfortably on a flat surface at the level of your heart Legs uncrossed Back supported Sit quietly and don't talk Place the cuff on your bare arm Adjust snuggly, so that only two fingertips can fit between your skin and the top of the cuff Check 2 readings separated by at least one minute Keep a log of your BP readings For a visual, please reference this diagram: http://ccnc.care/bpdiagram  Provider Name: [facility]     Phone: [phone]  Zone 1: ALL CLEAR  Continue to monitor your symptoms:  BP reading is less than 140 (top number) or less than 90 (bottom number)  No right upper stomach pain No headaches or seeing spots No feeling nauseated or throwing up No swelling in face and hands  Zone 2: CAUTION Call your doctor's office for any of the following:  BP reading is greater than 140 (top number) or greater than  90 (bottom number)  Stomach pain under your ribs in the middle or right side Headaches or seeing spots Feeling nauseated or throwing up Swelling in face and hands  Zone 3: EMERGENCY  Seek immediate medical care if you have any of the following:  BP reading is greater than160 (top number) or greater than 110 (bottom number) Severe headaches not improving with Tylenol Serious difficulty catching your breath Any worsening symptoms from  Zone 2     Second Trimester of Pregnancy The second trimester is from week 14 through week 27 (months 4 through 6). The second trimester is often a time when you feel your best. Your body has adjusted to being pregnant, and you begin to feel better physically. Usually, morning sickness has lessened or quit completely, you may have more energy, and you may have an increase in appetite. The second trimester is also a time when the fetus is growing rapidly. At the end of the sixth month, the fetus is about 9 inches long and weighs about 1 pounds. You will likely begin to feel the baby move (quickening) between 16 and 20 weeks of pregnancy. Body changes during your second trimester Your body continues to go through many changes during your second trimester. The changes vary from woman to woman. Your weight will continue to increase. You will notice your lower abdomen bulging out. You may begin to get stretch marks on your hips, abdomen, and breasts. You may develop headaches that can be relieved by medicines. The medicines should be approved by your health care provider. You may urinate more often because the fetus is pressing on your bladder. You may develop or continue to have heartburn as a result of your pregnancy. You may develop constipation because certain hormones are causing the muscles that push waste through your intestines to slow down. You may develop hemorrhoids or swollen, bulging veins (varicose veins). You may have back pain. This is caused by: Weight gain. Pregnancy hormones that are relaxing the joints in your pelvis. A shift in weight and the muscles that support your balance. Your breasts will continue to grow and they will continue to become tender. Your gums may bleed and may be sensitive to brushing and flossing. Dark spots or blotches (chloasma, mask of pregnancy) may develop on your face. This will likely fade after the baby is born. A dark line from your belly button to  the pubic area (linea nigra) may appear. This will likely fade after the baby is born. You may have changes in your hair. These can include thickening of your hair, rapid growth, and changes in texture. Some women also have hair loss during or after pregnancy, or hair that feels dry or thin. Your hair will most likely return to normal after your baby is born.  What to expect at prenatal visits During a routine prenatal visit: You will be weighed to make sure you and the fetus are growing normally. Your blood pressure will be taken. Your abdomen will be measured to track your baby's growth. The fetal heartbeat will be listened to. Any test results from the previous visit will be discussed.  Your health care provider may ask you: How you are feeling. If you are feeling the baby move. If you have had any abnormal symptoms, such as leaking fluid, bleeding, severe headaches, or abdominal cramping. If you are using any tobacco products, including cigarettes, chewing tobacco, and electronic cigarettes. If you have any questions.  Other tests that may be performed during   your second trimester include: Blood tests that check for: Low iron levels (anemia). High blood sugar that affects pregnant women (gestational diabetes) between 24 and 28 weeks. Rh antibodies. This is to check for a protein on red blood cells (Rh factor). Urine tests to check for infections, diabetes, or protein in the urine. An ultrasound to confirm the proper growth and development of the baby. An amniocentesis to check for possible genetic problems. Fetal screens for spina bifida and Down syndrome. HIV (human immunodeficiency virus) testing. Routine prenatal testing includes screening for HIV, unless you choose not to have this test.  Follow these instructions at home: Medicines Follow your health care provider's instructions regarding medicine use. Specific medicines may be either safe or unsafe to take during  pregnancy. Take a prenatal vitamin that contains at least 600 micrograms (mcg) of folic acid. If you develop constipation, try taking a stool softener if your health care provider approves. Eating and drinking Eat a balanced diet that includes fresh fruits and vegetables, whole grains, good sources of protein such as meat, eggs, or tofu, and low-fat dairy. Your health care provider will help you determine the amount of weight gain that is right for you. Avoid raw meat and uncooked cheese. These carry germs that can cause birth defects in the baby. If you have low calcium intake from food, talk to your health care provider about whether you should take a daily calcium supplement. Limit foods that are high in fat and processed sugars, such as fried and sweet foods. To prevent constipation: Drink enough fluid to keep your urine clear or pale yellow. Eat foods that are high in fiber, such as fresh fruits and vegetables, whole grains, and beans. Activity Exercise only as directed by your health care provider. Most women can continue their usual exercise routine during pregnancy. Try to exercise for 30 minutes at least 5 days a week. Stop exercising if you experience uterine contractions. Avoid heavy lifting, wear low heel shoes, and practice good posture. A sexual relationship may be continued unless your health care provider directs you otherwise. Relieving pain and discomfort Wear a good support bra to prevent discomfort from breast tenderness. Take warm sitz baths to soothe any pain or discomfort caused by hemorrhoids. Use hemorrhoid cream if your health care provider approves. Rest with your legs elevated if you have leg cramps or low back pain. If you develop varicose veins, wear support hose. Elevate your feet for 15 minutes, 3-4 times a day. Limit salt in your diet. Prenatal Care Write down your questions. Take them to your prenatal visits. Keep all your prenatal visits as told by your health  care provider. This is important. Safety Wear your seat belt at all times when driving. Make a list of emergency phone numbers, including numbers for family, friends, the hospital, and police and fire departments. General instructions Ask your health care provider for a referral to a local prenatal education class. Begin classes no later than the beginning of month 6 of your pregnancy. Ask for help if you have counseling or nutritional needs during pregnancy. Your health care provider can offer advice or refer you to specialists for help with various needs. Do not use hot tubs, steam rooms, or saunas. Do not douche or use tampons or scented sanitary pads. Do not cross your legs for long periods of time. Avoid cat litter boxes and soil used by cats. These carry germs that can cause birth defects in the baby and possibly loss of the   fetus by miscarriage or stillbirth. Avoid all smoking, herbs, alcohol, and unprescribed drugs. Chemicals in these products can affect the formation and growth of the baby. Do not use any products that contain nicotine or tobacco, such as cigarettes and e-cigarettes. If you need help quitting, ask your health care provider. Visit your dentist if you have not gone yet during your pregnancy. Use a soft toothbrush to brush your teeth and be gentle when you floss. Contact a health care provider if: You have dizziness. You have mild pelvic cramps, pelvic pressure, or nagging pain in the abdominal area. You have persistent nausea, vomiting, or diarrhea. You have a bad smelling vaginal discharge. You have pain when you urinate. Get help right away if: You have a fever. You are leaking fluid from your vagina. You have spotting or bleeding from your vagina. You have severe abdominal cramping or pain. You have rapid weight gain or weight loss. You have shortness of breath with chest pain. You notice sudden or extreme swelling of your face, hands, ankles, feet, or legs. You  have not felt your baby move in over an hour. You have severe headaches that do not go away when you take medicine. You have vision changes. Summary The second trimester is from week 14 through week 27 (months 4 through 6). It is also a time when the fetus is growing rapidly. Your body goes through many changes during pregnancy. The changes vary from woman to woman. Avoid all smoking, herbs, alcohol, and unprescribed drugs. These chemicals affect the formation and growth your baby. Do not use any tobacco products, such as cigarettes, chewing tobacco, and e-cigarettes. If you need help quitting, ask your health care provider. Contact your health care provider if you have any questions. Keep all prenatal visits as told by your health care provider. This is important. This information is not intended to replace advice given to you by your health care provider. Make sure you discuss any questions you have with your health care provider. Document Released: 02/27/2001 Document Revised: 08/11/2015 Document Reviewed: 05/06/2012 Elsevier Interactive Patient Education  2017 Elsevier Inc.  

## 2022-01-11 LAB — INTEGRATED 2
AFP MoM: 1.63
Alpha-Fetoprotein: 38 ng/mL
Crown Rump Length: 67.6 mm
DIA MoM: 1.06
DIA Value: 133.4 pg/mL
Estriol, Unconjugated: 0.94 ng/mL
Gest. Age on Collection Date: 12.9 weeks
Gestational Age: 15.9 weeks
Maternal Age at EDD: 21.9 yr
Nuchal Translucency (NT): 1.6 mm
Nuchal Translucency MoM: 1.05
Number of Fetuses: 1
PAPP-A MoM: 0.74
PAPP-A Value: 540.2 ng/mL
Test Results:: NEGATIVE
Weight: 219 [lb_av]
Weight: 219 [lb_av]
hCG MoM: 0.96
hCG Value: 28.1 IU/mL
uE3 MoM: 1.22

## 2022-01-30 ENCOUNTER — Ambulatory Visit (INDEPENDENT_AMBULATORY_CARE_PROVIDER_SITE_OTHER): Payer: Medicaid Other

## 2022-01-30 ENCOUNTER — Ambulatory Visit (INDEPENDENT_AMBULATORY_CARE_PROVIDER_SITE_OTHER): Payer: Medicaid Other | Admitting: Obstetrics & Gynecology

## 2022-01-30 ENCOUNTER — Encounter: Payer: Self-pay | Admitting: Obstetrics & Gynecology

## 2022-01-30 VITALS — BP 103/70 | HR 81 | Wt 213.0 lb

## 2022-01-30 DIAGNOSIS — O418X21 Other specified disorders of amniotic fluid and membranes, second trimester, fetus 1: Secondary | ICD-10-CM

## 2022-01-30 DIAGNOSIS — Z363 Encounter for antenatal screening for malformations: Secondary | ICD-10-CM | POA: Diagnosis not present

## 2022-01-30 DIAGNOSIS — Z3A19 19 weeks gestation of pregnancy: Secondary | ICD-10-CM

## 2022-01-30 DIAGNOSIS — O468X2 Other antepartum hemorrhage, second trimester: Secondary | ICD-10-CM

## 2022-01-30 DIAGNOSIS — Z3480 Encounter for supervision of other normal pregnancy, unspecified trimester: Secondary | ICD-10-CM

## 2022-01-30 DIAGNOSIS — Z3482 Encounter for supervision of other normal pregnancy, second trimester: Secondary | ICD-10-CM | POA: Diagnosis not present

## 2022-01-30 NOTE — Progress Notes (Signed)
[name] 19 wks,cephalic,posterior placenta gr 0,CX 3.8 cm,retroplacental hemorrhage fundal end 5.7 x .8 cm,retroplacental hemorrhage cervical end extending behind the tip of the placenta 4.1 x .7 cm,FHR 148 bpm,EFW 265 g 41%,anatomy complete

## 2022-01-30 NOTE — Progress Notes (Unsigned)
LOW-RISK PREGNANCY VISIT Patient name: Nichole [name] MRN [id number]  Date of birth: 2000-12-12 Chief Complaint:   Routine Prenatal Visit ([name] today; vaginal bleeding today; + eczema)  History of Present Illness:   Nichole [name] is a 21 y.o. G76P1001 female at [redacted]w[redacted]d with an Estimated Date of Delivery: 06/26/22 being seen today for ongoing management of a low-risk pregnancy.   She reports that today about an hour ago noted some pink then a little bit of bright red bleeding.  Notes some mild cramping.   Last IC a few days ago- maybe Friday.     Contractions: Not present. Vag. Bleeding: Small.  Movement: Present. denies leaking of fluid.     12/19/2021    3:21 PM 10/24/2021   10:19 AM 09/15/2021   11:17 AM 06/21/2021   11:53 AM 04/03/2021    3:49 PM  Depression screen PHQ 2/9  Decreased Interest 1 0 0 1 0  Down, Depressed, Hopeless 0 0 0 0 0  PHQ - 2 Score 1 0 0 1 0  Altered sleeping 1 0  1   Tired, decreased energy 1 0  1   Change in appetite 1 0  1   Feeling bad or failure about yourself  0 0  0   Trouble concentrating 0 0  0   Moving slowly or fidgety/restless 0 0  0   Suicidal thoughts 0 0  0   PHQ-9 Score 4 0  4   Difficult doing work/chores  Not difficult at all        Review of Systems:   Pertinent items are noted in HPI Denies headaches, visual changes, shortness of breath, chest pain, abdominal pain, severe nausea/vomiting, or problems with urination or bowel movements unless otherwise stated above. Pertinent History Reviewed:  Reviewed past medical,surgical, social, obstetrical and family history.  Reviewed problem list, medications and allergies.  Physical Assessment:   Vitals:   01/30/22 1552  BP: 103/70  Pulse: 81  Weight: 213 lb (96.6 kg)  Body mass index is 36.56 kg/m.        Physical Examination:   General appearance: Well appearing, and in no distress  Mental status: Alert, oriented to person, place, and time  Skin: Warm & dry  Respiratory:  Normal respiratory effort, no distress  Abdomen: Soft, gravid, nontender  Pelvic:  normal external genitalia,    Vagina- pink moist mucosa- no discharge or bleeding noted  cervix appears closed     Extremities:    Psych:  mood and affect appropriate  Fetal Status:     Movement: Present  cephalic,posterior placenta gr 0,CX 3.8 cm,retroplacental hemorrhage fundal end 5.7 x .8 cm,retroplacental hemorrhage cervical end extending behind the tip of the placenta 4.1 x .7 cm,FHR 148 bpm,EFW 265 g 41%,anatomy complete   Chaperone: n/a    No results found for this or any previous visit (from the past 24 hour(s)).   Assessment & Plan:  1) Low-risk pregnancy G2P1001 at 101w0d with an Estimated Date of Delivery: 06/26/22   -reviewed US findings with retroplacental bleed -reviewed precautions and encouraged pelvic rest for now -plan for repeat US in 4wks    Meds: No orders of the defined types were placed in this encounter.  Labs/procedures today: anatomy scan  Plan:  Continue routine obstetrical care as outlined above Next visit: prefers in person    Reviewed: Preterm labor symptoms and general obstetric precautions including but not limited to vaginal bleeding, contractions, leaking of fluid and fetal  movement were reviewed in detail with the patient.  All questions were answered.   Follow-up: Return in about 4 weeks (around 02/27/2022) for [place] visit.  Janyth Pupa, DO Attending [place], [facility] for [employer], [place]

## 2022-01-31 DIAGNOSIS — R110 Nausea: Secondary | ICD-10-CM | POA: Diagnosis not present

## 2022-01-31 DIAGNOSIS — A084 Viral intestinal infection, unspecified: Secondary | ICD-10-CM | POA: Diagnosis not present

## 2022-02-05 ENCOUNTER — Encounter: Payer: Self-pay | Admitting: Women's Health

## 2022-02-05 DIAGNOSIS — O468X9 Other antepartum hemorrhage, unspecified trimester: Secondary | ICD-10-CM | POA: Insufficient documentation

## 2022-02-26 ENCOUNTER — Other Ambulatory Visit: Payer: Self-pay | Admitting: Obstetrics & Gynecology

## 2022-02-26 DIAGNOSIS — N939 Abnormal uterine and vaginal bleeding, unspecified: Secondary | ICD-10-CM

## 2022-02-27 ENCOUNTER — Ambulatory Visit (INDEPENDENT_AMBULATORY_CARE_PROVIDER_SITE_OTHER): Payer: Medicaid Other | Admitting: Women's Health

## 2022-02-27 ENCOUNTER — Encounter: Payer: Self-pay | Admitting: Women's Health

## 2022-02-27 ENCOUNTER — Ambulatory Visit (INDEPENDENT_AMBULATORY_CARE_PROVIDER_SITE_OTHER): Payer: Medicaid Other

## 2022-02-27 VITALS — BP 108/63 | HR 78 | Wt 213.0 lb

## 2022-02-27 DIAGNOSIS — N939 Abnormal uterine and vaginal bleeding, unspecified: Secondary | ICD-10-CM

## 2022-02-27 DIAGNOSIS — Z3480 Encounter for supervision of other normal pregnancy, unspecified trimester: Secondary | ICD-10-CM

## 2022-02-27 DIAGNOSIS — Z3A23 23 weeks gestation of pregnancy: Secondary | ICD-10-CM

## 2022-02-27 DIAGNOSIS — Z3482 Encounter for supervision of other normal pregnancy, second trimester: Secondary | ICD-10-CM

## 2022-02-27 NOTE — Progress Notes (Signed)
LOW-RISK PREGNANCY VISIT Patient name: Nichole [name] MRN [id number]  Date of birth: 12-28-2000 Chief Complaint:   Routine Prenatal Visit  History of Present Illness:   Nichole [name] is a 21 y.o. G20P1001 female at [redacted]w[redacted]d with an Estimated Date of Delivery: 06/26/22 being seen today for ongoing management of a low-risk pregnancy.   Today she reports no complaints. Contractions: Not present. Vag. Bleeding: None.  Movement: Present. denies leaking of fluid.     12/19/2021    3:21 PM 10/24/2021   10:19 AM 09/15/2021   11:17 AM 06/21/2021   11:53 AM 04/03/2021    3:49 PM  Depression screen PHQ 2/9  Decreased Interest 1 0 0 1 0  Down, Depressed, Hopeless 0 0 0 0 0  PHQ - 2 Score 1 0 0 1 0  Altered sleeping 1 0  1   Tired, decreased energy 1 0  1   Change in appetite 1 0  1   Feeling bad or failure about yourself  0 0  0   Trouble concentrating 0 0  0   Moving slowly or fidgety/restless 0 0  0   Suicidal thoughts 0 0  0   PHQ-9 Score 4 0  4   Difficult doing work/chores  Not difficult at all           12/05/2021   11:42 AM 10/24/2021   10:19 AM 09/15/2021   11:17 AM 06/21/2021   11:54 AM  GAD 7 : Generalized Anxiety Score  Nervous, Anxious, on Edge 0 0 0 0  Control/stop worrying 0 0 0 0  Worry too much - different things 0 0 0 0  Trouble relaxing 0 0 0 0  Restless 0 0 0 0  Easily annoyed or irritable 0 0 0 0  Afraid - awful might happen 0 0 0 0  Total GAD 7 Score 0 0 0 0  Anxiety Difficulty  Not difficult at all Not difficult at all Not difficult at all      Review of Systems:   Pertinent items are noted in HPI Denies abnormal vaginal discharge w/ itching/odor/irritation, headaches, visual changes, shortness of breath, chest pain, abdominal pain, severe nausea/vomiting, or problems with urination or bowel movements unless otherwise stated above. Pertinent History Reviewed:  Reviewed past medical,surgical, social, obstetrical and family history.  Reviewed problem list,  medications and allergies. Physical Assessment:   Vitals:   02/27/22 1539  BP: 108/63  Pulse: 78  Weight: 213 lb (96.6 kg)  Body mass index is 36.56 kg/m.        Physical Examination:   General appearance: Well appearing, and in no distress  Mental status: Alert, oriented to person, place, and time  Skin: Warm & dry  Cardiovascular: Normal heart rate noted  Respiratory: Normal respiratory effort, no distress  Abdomen: Soft, gravid, nontender  Pelvic: Cervical exam deferred         Extremities: Edema: Trace  Fetal Status:     Movement: Present  [name] 23 wks,breech,posterior placenta gr 0,cx 3.8 cm,SCH resolved,SVP of fluid 4.3 cm,fhr 127 bpm,EFW 553 g 42%   Chaperone: N/A   No results found for this or any previous visit (from the past 24 hour(s)).  Assessment & Plan:  1) Low-risk pregnancy G2P1001 at [redacted]w[redacted]d with an Estimated Date of Delivery: 06/26/22   2) Retroplacental [facility], now resolved!   Meds: No orders of the defined types were placed in this encounter.  Labs/procedures today: U/S  Plan:  Continue routine obstetrical care  Next visit: prefers will be in person for pn2     Reviewed: Preterm labor symptoms and general obstetric precautions including but not limited to vaginal bleeding, contractions, leaking of fluid and fetal movement were reviewed in detail with the patient.  All questions were answered. Does have home bp cuff. Office bp cuff given: not applicable. Check bp weekly, let us know if consistently >140 and/or >90.  Follow-up: Return in about 4 weeks (around 03/27/2022) for LROB, PN2, CNM, in person.  Future Appointments  Date Time Provider Department Center  02/27/2022  3:50 PM Cheral Marker, CNM [facility]    No orders of the defined types were placed in this encounter.  Cheral Marker CNM, [facility] 02/27/2022 3:46 PM

## 2022-02-27 NOTE — Progress Notes (Signed)
[name] 23 wks,breech,posterior placenta gr 0,cx 3.8 cm,SCH resolved,SVP of fluid 4.3 cm,fhr 127 bpm,EFW 553 g 42%

## 2022-02-27 NOTE — Patient Instructions (Signed)
[place], thank you for choosing our office today! We appreciate the opportunity to meet your healthcare needs. You may receive a short survey by mail, e-mail, or through [employer]. If you are happy with your care we would appreciate if you could take just a few minutes to complete the survey questions. We read all of your comments and take your feedback very seriously. Thank you again for choosing our office.  [facility] [employer] Team at [facility]  [facility] at [facility] ([address] [place], [place] [address]) Entrance C, located off of [address] Free 24/7 valet parking   You will have your sugar test next visit.  Please do not eat or drink anything after midnight the night before you come, not even water.  You will be here for at least two hours.  Please make an appointment online for the bloodwork at [email or web address] for 8:00am (or as close to this as possible). Make sure you select the [facility] service center.   CLASSES: Go to [email or web address] to register for classes (childbirth, breastfeeding, waterbirth, infant CPR, daddy bootcamp, etc.)  Call the office [phone]) or go to [facility] if: You begin to have strong, frequent contractions Your water breaks.  Sometimes it is a big gush of fluid, sometimes it is just a trickle that keeps getting your panties wet or running down your legs You have vaginal bleeding.  It is normal to have a small amount of spotting if your cervix was checked.  You don't feel your baby moving like normal.  If you don't, get you something to eat and drink and lay down and focus on feeling your baby move.   If your baby is still not moving like normal, you should call the office or go to [facility].  Call the office [phone]) or go to [facility] hospital for these signs of pre-eclampsia: Severe headache that does not go away with Tylenol Visual changes- seeing spots, double, blurred vision Pain under your right breast or  upper abdomen that does not go away with Tums or heartburn medicine Nausea and/or vomiting Severe swelling in your hands, feet, and face    [facility] Pediatricians/Family Doctors [facility]): [address] Colette Ribas, [phone]           [facility]: [address], [phone]                [facility]): [address], [phone] (call to ask if accepting patients) [facility] Department: [address], [place], [phone]    [facility] Pediatricians/Family Doctors [facility]): [address], [phone] [facility]: [address] [place], [phone] [facility]: [address], [phone]  [facility] Doctors  [facility]): [phone] [facility]: [address] [phone]   [facility] Doctors [facility]: [address], [phone]  [facility] Doctors  [facility]: [phone], [phone]  Home Blood Pressure Monitoring for Patients   Your provider has recommended that you check your blood pressure (BP) at least once a week at home. If you do not have a blood pressure cuff at home, one will be provided for you. Contact your provider if you have not received your monitor within 1 week.   Helpful Tips for Accurate Home Blood Pressure Checks  Don't smoke, exercise, or drink  caffeine 30 minutes before checking your BP Use the restroom before checking your BP (a full bladder can raise your pressure) Relax in a comfortable upright chair Feet on the ground Left arm resting comfortably on a flat surface at the level of your heart Legs uncrossed Back supported Sit quietly and don't talk Place the cuff on your bare arm Adjust snuggly, so that only two fingertips can fit between your skin and the top of the cuff Check 2  readings separated by at least one minute Keep a log of your BP readings For a visual, please reference this diagram: http://ccnc.care/bpdiagram  Provider Name: [facility]     Phone: [phone]  Zone 1: ALL CLEAR  Continue to monitor your symptoms:  BP reading is less than 140 (top number) or less than 90 (bottom number)  No right upper stomach pain No headaches or seeing spots No feeling nauseated or throwing up No swelling in face and hands  Zone 2: CAUTION Call your doctor's office for any of the following:  BP reading is greater than 140 (top number) or greater than 90 (bottom number)  Stomach pain under your ribs in the middle or right side Headaches or seeing spots Feeling nauseated or throwing up Swelling in face and hands  Zone 3: EMERGENCY  Seek immediate medical care if you have any of the following:  BP reading is greater than160 (top number) or greater than 110 (bottom number) Severe headaches not improving with Tylenol Serious difficulty catching your breath Any worsening symptoms from Zone 2   Second Trimester of Pregnancy The second trimester is from week 13 through week 28, months 4 through 6. The second trimester is often a time when you feel your best. Your body has also adjusted to being pregnant, and you begin to feel better physically. Usually, morning sickness has lessened or quit completely, you may have more energy, and you may have an increase in appetite. The second trimester is also a time when the fetus is growing rapidly. At the end of the sixth month, the fetus is about 9 inches long and weighs about 1 pounds. You will likely begin to feel the baby move (quickening) between 18 and 20 weeks of the pregnancy. BODY CHANGES Your body goes through many changes during pregnancy. The changes vary from woman to woman.  Your weight will continue to increase. You will notice your lower abdomen bulging out. You may begin to get stretch marks on your  hips, abdomen, and breasts. You may develop headaches that can be relieved by medicines approved by your health care provider. You may urinate more often because the fetus is pressing on your bladder. You may develop or continue to have heartburn as a result of your pregnancy. You may develop constipation because certain hormones are causing the muscles that push waste through your intestines to slow down. You may develop hemorrhoids or swollen, bulging veins (varicose veins). You may have back pain because of the weight gain and pregnancy hormones relaxing your joints between the bones in your pelvis and as a result of a shift in weight and the muscles that support your balance. Your breasts will continue to grow and be tender. Your gums may bleed and may be sensitive to brushing and flossing. Dark spots or blotches (chloasma, mask of pregnancy) may develop on your face. This will likely fade after the baby is born. A dark line from your belly button to the pubic area (linea nigra) may appear. This  will likely fade after the baby is born. You may have changes in your hair. These can include thickening of your hair, rapid growth, and changes in texture. Some women also have hair loss during or after pregnancy, or hair that feels dry or thin. Your hair will most likely return to normal after your baby is born. WHAT TO EXPECT AT YOUR PRENATAL VISITS During a routine prenatal visit: You will be weighed to make sure you and the fetus are growing normally. Your blood pressure will be taken. Your abdomen will be measured to track your baby's growth. The fetal heartbeat will be listened to. Any test results from the previous visit will be discussed. Your health care provider may ask you: How you are feeling. If you are feeling the baby move. If you have had any abnormal symptoms, such as leaking fluid, bleeding, severe headaches, or abdominal cramping. If you have any questions. Other tests that may  be performed during your second trimester include: Blood tests that check for: Low iron levels (anemia). Gestational diabetes (between 24 and 28 weeks). Rh antibodies. Urine tests to check for infections, diabetes, or protein in the urine. An ultrasound to confirm the proper growth and development of the baby. An amniocentesis to check for possible genetic problems. Fetal screens for spina bifida and Down syndrome. HOME CARE INSTRUCTIONS  Avoid all smoking, herbs, alcohol, and unprescribed drugs. These chemicals affect the formation and growth of the baby. Follow your health care provider's instructions regarding medicine use. There are medicines that are either safe or unsafe to take during pregnancy. Exercise only as directed by your health care provider. Experiencing uterine cramps is a good sign to stop exercising. Continue to eat regular, healthy meals. Wear a good support bra for breast tenderness. Do not use hot tubs, steam rooms, or saunas. Wear your seat belt at all times when driving. Avoid raw meat, uncooked cheese, cat litter boxes, and soil used by cats. These carry germs that can cause birth defects in the baby. Take your prenatal vitamins. Try taking a stool softener (if your health care provider approves) if you develop constipation. Eat more high-fiber foods, such as fresh vegetables or fruit and whole grains. Drink plenty of fluids to keep your urine clear or pale yellow. Take warm sitz baths to soothe any pain or discomfort caused by hemorrhoids. Use hemorrhoid cream if your health care provider approves. If you develop varicose veins, wear support hose. Elevate your feet for 15 minutes, 3-4 times a day. Limit salt in your diet. Avoid heavy lifting, wear low heel shoes, and practice good posture. Rest with your legs elevated if you have leg cramps or low back pain. Visit your dentist if you have not gone yet during your pregnancy. Use a soft toothbrush to brush your teeth  and be gentle when you floss. A sexual relationship may be continued unless your health care provider directs you otherwise. Continue to go to all your prenatal visits as directed by your health care provider. SEEK MEDICAL CARE IF:  You have dizziness. You have mild pelvic cramps, pelvic pressure, or nagging pain in the abdominal area. You have persistent nausea, vomiting, or diarrhea. You have a bad smelling vaginal discharge. You have pain with urination. SEEK IMMEDIATE MEDICAL CARE IF:  You have a fever. You are leaking fluid from your vagina. You have spotting or bleeding from your vagina. You have severe abdominal cramping or pain. You have rapid weight gain or loss. You have shortness of   breath with chest pain. You notice sudden or extreme swelling of your face, hands, ankles, feet, or legs. You have not felt your baby move in over an hour. You have severe headaches that do not go away with medicine. You have vision changes. Document Released: 02/27/2001 Document Revised: 03/10/2013 Document Reviewed: 05/06/2012 [facility] Patient Information 2015 [place], [place]. This information is not intended to replace advice given to you by your health care provider. Make sure you discuss any questions you have with your health care provider.

## 2022-03-06 ENCOUNTER — Telehealth: Payer: Medicaid Other | Admitting: Nurse Practitioner

## 2022-03-06 ENCOUNTER — Inpatient Hospital Stay (HOSPITAL_COMMUNITY): Payer: Medicaid Other

## 2022-03-06 ENCOUNTER — Encounter (HOSPITAL_COMMUNITY): Payer: Self-pay | Admitting: Obstetrics & Gynecology

## 2022-03-06 ENCOUNTER — Inpatient Hospital Stay (HOSPITAL_COMMUNITY)
Admission: AD | Admit: 2022-03-06 | Discharge: 2022-03-06 | Disposition: A | Discharge status: Home / Self Care | Payer: Medicaid Other | Attending: Obstetrics & Gynecology | Admitting: Obstetrics & Gynecology

## 2022-03-06 DIAGNOSIS — Z3A24 24 weeks gestation of pregnancy: Secondary | ICD-10-CM | POA: Insufficient documentation

## 2022-03-06 DIAGNOSIS — M549 Dorsalgia, unspecified: Secondary | ICD-10-CM | POA: Diagnosis not present

## 2022-03-06 DIAGNOSIS — O99891 Other specified diseases and conditions complicating pregnancy: Secondary | ICD-10-CM | POA: Diagnosis not present

## 2022-03-06 DIAGNOSIS — O26892 Other specified pregnancy related conditions, second trimester: Secondary | ICD-10-CM | POA: Insufficient documentation

## 2022-03-06 DIAGNOSIS — R339 Retention of urine, unspecified: Secondary | ICD-10-CM | POA: Diagnosis not present

## 2022-03-06 DIAGNOSIS — N3289 Other specified disorders of bladder: Secondary | ICD-10-CM | POA: Insufficient documentation

## 2022-03-06 DIAGNOSIS — R109 Unspecified abdominal pain: Secondary | ICD-10-CM | POA: Diagnosis not present

## 2022-03-06 DIAGNOSIS — Z3689 Encounter for other specified antenatal screening: Secondary | ICD-10-CM

## 2022-03-06 LAB — URINALYSIS, ROUTINE W REFLEX MICROSCOPIC
Bilirubin Urine: NEGATIVE
Glucose, UA: NEGATIVE mg/dL
Ketones, ur: NEGATIVE mg/dL
Nitrite: NEGATIVE
Protein, ur: NEGATIVE mg/dL
Specific Gravity, Urine: 1.009 (ref 1.005–1.030)
pH: 7 (ref 5.0–8.0)

## 2022-03-06 MED ORDER — ACETAMINOPHEN 500 MG PO TABS
1000.0000 mg | ORAL_TABLET | Freq: Once | ORAL | Status: AC
  Administered 2022-03-06: 1000 mg via ORAL
  Filled 2022-03-06: qty 2

## 2022-03-06 MED ORDER — CYCLOBENZAPRINE HCL 5 MG PO TABS
10.0000 mg | ORAL_TABLET | Freq: Once | ORAL | Status: AC
  Administered 2022-03-06: 10 mg via ORAL
  Filled 2022-03-06: qty 2

## 2022-03-06 NOTE — MAU Note (Addendum)
Pt says her back hurts - rotating to her abd- constant  started at 0400 No VB Has an appointment  today at [facility]  - for UTI- has burning  Has vomited x1 this am

## 2022-03-06 NOTE — Discharge Instructions (Signed)

## 2022-03-06 NOTE — MAU Provider Note (Signed)
History     CSN: [id number]  Arrival date and time: 03/06/22 0531   Event Date/Time   First Provider Initiated Contact with Patient 03/06/22 [phone]      Chief Complaint  Patient presents with   Abdominal Pain   HPI Nichole [name] is a 21 y.o. G2P1001 at [redacted]w[redacted]d who receives care at [facility].  She presents today for Back Pain.  Patient states she awoke with back pain around 0300.  She states the pain started int he middle of her back and then started to radiate to her hips and abdominal area after about 3o minutes.  She states the pain is constant and is a burning sensation.  She states it has not improved with position changes and is worsened with movements.  She rates the pain a 8/10.  Patient endorses fetal movement and denies vaginal concerns.  She also reports some pain with urination that has been present since Saturday.    OB History     Gravida  2   Para  1   Term  1   Preterm      AB      Living  1      SAB      IAB      Ectopic      Multiple  0   Live Births  1           Past Medical History:  Diagnosis Date   Asthma     Past Surgical History:  Procedure Laterality Date   NO PAST SURGERIES      Family History  Problem Relation Age of Onset   Diabetes Father     Social History   Tobacco Use   Smoking status: Never   Smokeless tobacco: Never  Vaping Use   Vaping Use: Never used  Substance Use Topics   Alcohol use: No   Drug use: No    Allergies: No Known Allergies  Medications Prior to Admission  Medication Sig Dispense Refill Last Dose   albuterol (VENTOLIN HFA) 108 (90 Base) MCG/ACT inhaler Inhale 2 puffs into the lungs every 6 (six) hours as needed. 18 g 2 Past Week   Prenatal Vit-Fe Fumarate-FA (PRENATAL VITAMIN PO) Take by mouth.   More than a month    Review of Systems  Constitutional:  Negative for chills and fever.  Respiratory:  Negative for cough and shortness of breath.   Gastrointestinal:  Positive for  abdominal pain. Negative for constipation, diarrhea, nausea and vomiting.  Genitourinary:  Positive for dysuria. Negative for difficulty urinating, dyspareunia, vaginal bleeding and vaginal discharge.  Musculoskeletal:  Positive for back pain.  Neurological:  Negative for dizziness, light-headedness and headaches.   Physical Exam   Blood pressure 114/67, pulse 96, temperature 97.7 F (36.5 C), temperature source Oral, resp. rate 16, height 5\' 4"  (1.626 m), weight 97.4 kg, currently breastfeeding.  Physical Exam Vitals reviewed. Exam conducted with a chaperone present.  Constitutional:      Appearance: She is well-developed.  HENT:     Head: Normocephalic and atraumatic.  Eyes:     Conjunctiva/sclera: Conjunctivae normal.  Cardiovascular:     Rate and Rhythm: Normal rate.  Pulmonary:     Effort: Pulmonary effort is normal. No respiratory distress.  Abdominal:     Palpations: Abdomen is soft.     Tenderness: There is generalized abdominal tenderness. There is right CVA tenderness. There is no left CVA tenderness.     Comments: Gravid, Appears LGA  Genitourinary:    Comments: Dilation: Closed Cervical Position: Posterior Exam by:: Shanda Bumps, CNM  Musculoskeletal:     Cervical back: Normal range of motion.  Skin:    General: Skin is warm and dry.  Neurological:     Mental Status: She is alert and oriented to person, place, and time.  Psychiatric:        Mood and Affect: Mood normal.        Behavior: Behavior normal.     Fetal Assessment 130 bpm, Mod Var, -Decels, -Accels Toco: No ctx graphed  MAU Course   Results for orders placed or performed during the hospital encounter of 03/06/22 (from the past 24 hour(s))  Urinalysis, Routine w reflex microscopic Urine, Clean Catch     Status: Abnormal   Collection Time: 03/06/22  6:01 AM  Result Value Ref Range   Color, Urine YELLOW YELLOW   APPearance HAZY (A) CLEAR   Specific Gravity, Urine 1.009 1.005 - 1.030   pH 7.0 5.0 -  8.0   Glucose, UA NEGATIVE NEGATIVE mg/dL   Hgb urine dipstick MODERATE (A) NEGATIVE   Bilirubin Urine NEGATIVE NEGATIVE   Ketones, ur NEGATIVE NEGATIVE mg/dL   Protein, ur NEGATIVE NEGATIVE mg/dL   Nitrite NEGATIVE NEGATIVE   Leukocytes,Ua MODERATE (A) NEGATIVE   RBC / HPF 0-5 0 - 5 RBC/hpf   WBC, UA 21-50 0 - 5 WBC/hpf   Bacteria, UA RARE (A) NONE SEEN   Squamous Epithelial / LPF 0-5 0 - 5   Mucus PRESENT    US RENAL  Result Date: 03/06/2022 CLINICAL DATA:  Twenty-four weeks pregnant with right flank pain. EXAM: RENAL / URINARY TRACT ULTRASOUND COMPLETE COMPARISON:  None Available. FINDINGS: Right Kidney: Renal measurements: 12.2 x 4.4 x 5.6 cm = volume: 154.5 mL. Echogenicity within normal limits. No mass or hydronephrosis visualized. Left Kidney: Renal measurements: 13 x 6.9 x 4.3 cm = volume: 203.7 mL. Echogenicity within normal limits. No mass or hydronephrosis visualized. Bladder: Appears normal for degree of bladder distention. Other: None. IMPRESSION: Normal renal sonogram. Electronically Signed   By: Signa Kell M.D.   On: 03/06/2022 07:26    MDM PE Labs: UA, UC EFM Renal US Pain Medication Assessment and Plan  21 year old G2P1001  SIUP at 24 weeks Cat I FT Bilateral Flank Pain Abdominal Cramping  -POC Reviewed. -Exam performed and findings discussed. -Patient offered and accepts pain medication. -Give tylenol and flexeril. -Send for renal US and await results.   Cherre Robins MSN, CNM 03/06/2022, 6:05 AM   Reassessment (7:49 AM) -[name] returns without significant findings. -UA with some blood and leuks, but otherwise insignificant.  Sent for culture. -Provider to bedside to review results. -Patient reports no pain. -Discussed usage of tylenol at home prn. -Further encouraged usage of heating pads. -NST reactive. -Encouraged to call primary office or return to MAU if symptoms worsen or with the onset of new symptoms. -Discharged to home in improved  condition.  Cherre Robins MSN, CNM Advanced Practice Provider, [facility] [employer]

## 2022-03-08 ENCOUNTER — Other Ambulatory Visit: Payer: Self-pay

## 2022-03-08 DIAGNOSIS — O2342 Unspecified infection of urinary tract in pregnancy, second trimester: Secondary | ICD-10-CM | POA: Insufficient documentation

## 2022-03-08 LAB — CULTURE, OB URINE: Culture: >=100000 — AB

## 2022-03-08 MED ORDER — CEFADROXIL 500 MG PO CAPS: 500.0000 mg | ORAL_CAPSULE | Freq: Two times a day (BID) | ORAL | 0 refills | Status: DC

## 2022-03-12 ENCOUNTER — Other Ambulatory Visit: Payer: Self-pay | Admitting: Family Medicine

## 2022-03-12 DIAGNOSIS — O2342 Unspecified infection of urinary tract in pregnancy, second trimester: Secondary | ICD-10-CM

## 2022-03-12 MED ORDER — NITROFURANTOIN MONOHYD MACRO 100 MG PO CAPS: 100.0000 mg | ORAL_CAPSULE | Freq: Two times a day (BID) | ORAL | 0 refills | Status: DC

## 2022-03-13 ENCOUNTER — Telehealth: Payer: Self-pay | Admitting: Family Medicine

## 2022-03-13 NOTE — Telephone Encounter (Signed)
Pt states that she is not sure why the medication was sent in. Advised that her OBGYN had sent it in for a UTI. Pt understood and we start taking the medication.

## 2022-03-19 NOTE — L&D Delivery Note (Signed)
OB/GYN Faculty Practice Delivery Note  Tekia Bronikowski is a 22 y.o. G2P1001 s/p SVD at [redacted]w[redacted]d. She was admitted for SOL.   ROM: with delivery of head; clear fluid GBS Status: pos (PCN x 1 dose) Maximum Maternal Temperature: 97.8  Labor Progress: Ms Kiley Fowler was admitted in early active labor; she was given a dose of PCN for GBS ppx and she progressed spontaneously to SVD.  Delivery Date/Time: April 6th, 2024 at 0331 Delivery: Called to room and patient was 9cm, intact, and pushing. TXA given during delivery due to PPH hx.  Head delivered ROA with membranes rupturing clear as head emerged. No nuchal cord present. Shoulder and body delivered in usual fashion. Infant with spontaneous cry, placed on mother's abdomen, dried and stimulated. Cord clamped x 2 after 1-minute delay, and cut by FOB. Placenta emerged spontaneously without cord traction, along with a gush of blood. Cord blood drawn by surg tech as placenta delivered too quickly to collect prior. Fundus boggy with massage; Pitocin and cytotec 400PR/400oral given with good results. Labia, perineum, vagina, and cervix inspected and found to have bleeding R labial lac.   Placenta: spont, intact; to L&D Complications: PPH of 1329cc (immediate w del of placenta, plus lac)  Lacerations: R labial; repaired with 4-0 Monocryl EBL: 1329cc Analgesia: 1% lidocaine; Fentanyl given just prior to delivery  Postpartum Planning [x]  message to sent to schedule follow-up   Infant: boy  APGARs 9/9  3090g (6lb 13oz)  Arabella Merles, CNM  06/23/2022 4:09 AM

## 2022-03-28 ENCOUNTER — Ambulatory Visit (INDEPENDENT_AMBULATORY_CARE_PROVIDER_SITE_OTHER): Payer: Medicaid Other | Admitting: Advanced Practice Midwife

## 2022-03-28 ENCOUNTER — Other Ambulatory Visit: Payer: Medicaid Other

## 2022-03-28 VITALS — BP 102/70 | HR 101 | Wt 214.0 lb

## 2022-03-28 DIAGNOSIS — O2342 Unspecified infection of urinary tract in pregnancy, second trimester: Secondary | ICD-10-CM

## 2022-03-28 DIAGNOSIS — Z3A27 27 weeks gestation of pregnancy: Secondary | ICD-10-CM | POA: Diagnosis not present

## 2022-03-28 DIAGNOSIS — Z131 Encounter for screening for diabetes mellitus: Secondary | ICD-10-CM

## 2022-03-28 DIAGNOSIS — Z3480 Encounter for supervision of other normal pregnancy, unspecified trimester: Secondary | ICD-10-CM

## 2022-03-28 DIAGNOSIS — Z1332 Encounter for screening for maternal depression: Secondary | ICD-10-CM | POA: Diagnosis not present

## 2022-03-28 DIAGNOSIS — Z23 Encounter for immunization: Secondary | ICD-10-CM

## 2022-03-28 NOTE — Patient Instructions (Signed)
Nichole [name], thank you for choosing our office today! We appreciate the opportunity to meet your healthcare needs. You may receive a short survey by mail, e-mail, or through MyChart. If you are happy with your care we would appreciate if you could take just a few minutes to complete the survey questions. We read all of your comments and take your feedback very seriously. Thank you again for choosing our office.  [facility] Team at [facility] at  ([address] [place], [place] [address]) Entrance C, located off of [address] Free 24/7 valet parking   CLASSES: Go to [email or web address] to register for classes (childbirth, breastfeeding, waterbirth, infant CPR, daddy bootcamp, etc.)  Call the office ([phone]) or go to [facility] if: You begin to have strong, frequent contractions Your water breaks.  Sometimes it is a big gush of fluid, sometimes it is just a trickle that keeps getting your panties wet or running down your legs You have vaginal bleeding.  It is normal to have a small amount of spotting if your cervix was checked.  You don't feel your baby moving like normal.  If you don't, get you something to eat and drink and lay down and focus on feeling your baby move.   If your baby is still not moving like normal, you should call the office or go to [facility].  Call the office ([phone]) or go to [facility] for these signs of pre-eclampsia: Severe headache that does not go away with Tylenol Visual changes- seeing spots, double, blurred vision Pain under your right breast or upper abdomen that does not go away with Tums or heartburn medicine Nausea and/or vomiting Severe swelling in your hands, feet, and face   Tdap Vaccine It is recommended that you get the Tdap vaccine during the third trimester of EACH pregnancy to help protect your baby from getting pertussis (whooping cough) 27-36 weeks is the BEST time to do  this so that you can pass the protection on to your baby. During pregnancy is better than after pregnancy, but if you are unable to get it during pregnancy it will be offered at the hospital.  You can get this vaccine with us, at the health department, your family doctor, or some local pharmacies Everyone who will be around your baby should also be up-to-date on their vaccines before the baby comes. Adults (who are not pregnant) only need 1 dose of Tdap during adulthood.   [place] Pediatricians/Family Doctors [facility] ([facility]): [address], [phone]           [facility]: [address], [phone]                [facility] ([facility]): [address], [phone] (call to ask if accepting patients) [facility]: [address], [place], [phone]    [place] Pediatricians/Family Doctors [facility] ([facility]): [address], [phone] [facility]: [address], [phone] [facility]: [address], [phone]  [place] [facility]  [facility] ([facility]): [phone] [facility]: [address], [phone]   [place] [facility]: [address], [phone]  [place] [facility]  [facility]: [address], [phone]  Home Blood Pressure Monitoring for Patients   Your provider has recommended that you check your   blood pressure (BP) at least once a week at home. If you do not have a blood pressure cuff at home, one will be provided for you. Contact your provider if you have not received your monitor within 1 week.   Helpful Tips for Accurate Home Blood Pressure Checks  Don't smoke, exercise, or drink caffeine 30 minutes before checking your BP Use the restroom before checking your BP (a full bladder can raise your  pressure) Relax in a comfortable upright chair Feet on the ground Left arm resting comfortably on a flat surface at the level of your heart Legs uncrossed Back supported Sit quietly and don't talk Place the cuff on your bare arm Adjust snuggly, so that only two fingertips can fit between your skin and the top of the cuff Check 2 readings separated by at least one minute Keep a log of your BP readings For a visual, please reference this diagram: http://ccnc.care/bpdiagram  Provider Name: [facility]     Phone: [phone]  Zone 1: ALL CLEAR  Continue to monitor your symptoms:  BP reading is less than 140 (top number) or less than 90 (bottom number)  No right upper stomach pain No headaches or seeing spots No feeling nauseated or throwing up No swelling in face and hands  Zone 2: CAUTION Call your doctor's office for any of the following:  BP reading is greater than 140 (top number) or greater than 90 (bottom number)  Stomach pain under your ribs in the middle or right side Headaches or seeing spots Feeling nauseated or throwing up Swelling in face and hands  Zone 3: EMERGENCY  Seek immediate medical care if you have any of the following:  BP reading is greater than160 (top number) or greater than 110 (bottom number) Severe headaches not improving with Tylenol Serious difficulty catching your breath Any worsening symptoms from Zone 2   Third Trimester of Pregnancy The third trimester is from week 29 through week 42, months 7 through 9. The third trimester is a time when the fetus is growing rapidly. At the end of the ninth month, the fetus is about 20 inches in length and weighs 6-10 pounds.  BODY CHANGES Your body goes through many changes during pregnancy. The changes vary from woman to woman.  Your weight will continue to increase. You can expect to gain 25-35 pounds (11-16 kg) by the end of the pregnancy. You may begin to get stretch marks on your hips, abdomen,  and breasts. You may urinate more often because the fetus is moving lower into your pelvis and pressing on your bladder. You may develop or continue to have heartburn as a result of your pregnancy. You may develop constipation because certain hormones are causing the muscles that push waste through your intestines to slow down. You may develop hemorrhoids or swollen, bulging veins (varicose veins). You may have pelvic pain because of the weight gain and pregnancy hormones relaxing your joints between the bones in your pelvis. Backaches may result from overexertion of the muscles supporting your posture. You may have changes in your hair. These can include thickening of your hair, rapid growth, and changes in texture. Some women also have hair loss during or after pregnancy, or hair that feels dry or thin. Your hair will most likely return to normal after your baby is born. Your breasts will continue to grow and be tender. A yellow discharge may leak from your breasts called colostrum. Your belly button may stick out. You may   feel short of breath because of your expanding uterus. You may notice the fetus "dropping," or moving lower in your abdomen. You may have a bloody mucus discharge. This usually occurs a few days to a week before labor begins. Your cervix becomes thin and soft (effaced) near your due date. WHAT TO EXPECT AT YOUR PRENATAL EXAMS  You will have prenatal exams every 2 weeks until week 36. Then, you will have weekly prenatal exams. During a routine prenatal visit: You will be weighed to make sure you and the fetus are growing normally. Your blood pressure is taken. Your abdomen will be measured to track your baby's growth. The fetal heartbeat will be listened to. Any test results from the previous visit will be discussed. You may have a cervical check near your due date to see if you have effaced. At around 36 weeks, your caregiver will check your cervix. At the same time, your  caregiver will also perform a test on the secretions of the vaginal tissue. This test is to determine if a type of bacteria, Group B streptococcus, is present. Your caregiver will explain this further. Your caregiver may ask you: What your birth plan is. How you are feeling. If you are feeling the baby move. If you have had any abnormal symptoms, such as leaking fluid, bleeding, severe headaches, or abdominal cramping. If you have any questions. Other tests or screenings that may be performed during your third trimester include: Blood tests that check for low iron levels (anemia). Fetal testing to check the health, activity level, and growth of the fetus. Testing is done if you have certain medical conditions or if there are problems during the pregnancy. FALSE LABOR You may feel small, irregular contractions that eventually go away. These are called Braxton Hicks contractions, or false labor. Contractions may last for hours, days, or even weeks before true labor sets in. If contractions come at regular intervals, intensify, or become painful, it is best to be seen by your caregiver.  SIGNS OF LABOR  Menstrual-like cramps. Contractions that are 5 minutes apart or less. Contractions that start on the top of the uterus and spread down to the lower abdomen and back. A sense of increased pelvic pressure or back pain. A watery or bloody mucus discharge that comes from the vagina. If you have any of these signs before the 37th week of pregnancy, call your caregiver right away. You need to go to the hospital to get checked immediately. HOME CARE INSTRUCTIONS  Avoid all smoking, herbs, alcohol, and unprescribed drugs. These chemicals affect the formation and growth of the baby. Follow your caregiver's instructions regarding medicine use. There are medicines that are either safe or unsafe to take during pregnancy. Exercise only as directed by your caregiver. Experiencing uterine cramps is a good sign to  stop exercising. Continue to eat regular, healthy meals. Wear a good support bra for breast tenderness. Do not use hot tubs, steam rooms, or saunas. Wear your seat belt at all times when driving. Avoid raw meat, uncooked cheese, cat litter boxes, and soil used by cats. These carry germs that can cause birth defects in the baby. Take your prenatal vitamins. Try taking a stool softener (if your caregiver approves) if you develop constipation. Eat more high-fiber foods, such as fresh vegetables or fruit and whole grains. Drink plenty of fluids to keep your urine clear or pale yellow. Take warm sitz baths to soothe any pain or discomfort caused by hemorrhoids. Use hemorrhoid cream if   your caregiver approves. If you develop varicose veins, wear support hose. Elevate your feet for 15 minutes, 3-4 times a day. Limit salt in your diet. Avoid heavy lifting, wear low heal shoes, and practice good posture. Rest a lot with your legs elevated if you have leg cramps or low back pain. Visit your dentist if you have not gone during your pregnancy. Use a soft toothbrush to brush your teeth and be gentle when you floss. A sexual relationship may be continued unless your caregiver directs you otherwise. Do not travel far distances unless it is absolutely necessary and only with the approval of your caregiver. Take prenatal classes to understand, practice, and ask questions about the labor and delivery. Make a trial run to the hospital. Pack your hospital bag. Prepare the baby's nursery. Continue to go to all your prenatal visits as directed by your caregiver. SEEK MEDICAL CARE IF: You are unsure if you are in labor or if your water has broken. You have dizziness. You have mild pelvic cramps, pelvic pressure, or nagging pain in your abdominal area. You have persistent nausea, vomiting, or diarrhea. You have a bad smelling vaginal discharge. You have pain with urination. SEEK IMMEDIATE MEDICAL CARE IF:  You  have a fever. You are leaking fluid from your vagina. You have spotting or bleeding from your vagina. You have severe abdominal cramping or pain. You have rapid weight loss or gain. You have shortness of breath with chest pain. You notice sudden or extreme swelling of your face, hands, ankles, feet, or legs. You have not felt your baby move in over an hour. You have severe headaches that do not go away with medicine. You have vision changes. Document Released: 02/27/2001 Document Revised: 03/10/2013 Document Reviewed: 05/06/2012 ExitCare Patient Information 2015 [employer]. This information is not intended to replace advice given to you by your health care provider. Make sure you discuss any questions you have with your health care provider.       

## 2022-03-28 NOTE — Progress Notes (Signed)
   LOW-RISK PREGNANCY VISIT Patient name: Nichole [name] MRN [id number]  Date of birth: [birth year] Chief Complaint:   Routine Prenatal Visit  History of Present Illness:   Nichole [name] is a 22 y.o. G83P1001 female at [redacted]w[redacted]d with an Estimated Date of Delivery: 06/26/22 being seen today for ongoing management of a low-risk pregnancy.  Today she reports no complaints. Contractions: Not present. Vag. Bleeding: None.  Movement: Present. denies leaking of fluid. Review of Systems:   Pertinent items are noted in HPI Denies abnormal vaginal discharge w/ itching/odor/irritation, headaches, visual changes, shortness of breath, chest pain, abdominal pain, severe nausea/vomiting, or problems with urination or bowel movements unless otherwise stated above. Pertinent History Reviewed:  Reviewed past medical,surgical, social, obstetrical and family history.  Reviewed problem list, medications and allergies. Physical Assessment:   Vitals:   03/28/22 0915  BP: 102/70  Pulse: (!) 101  Weight: 214 lb (97.1 kg)  Body mass index is 36.73 kg/m.        Physical Examination:   General appearance: Well appearing, and in no distress  Mental status: Alert, oriented to person, place, and time  Skin: Warm & dry  Cardiovascular: Normal heart rate noted  Respiratory: Normal respiratory effort, no distress  Abdomen: Soft, gravid, nontender  Pelvic: Cervical exam deferred         Extremities: Edema: Trace  Fetal Status: Fetal Heart Rate (bpm): 141 Fundal Height: 27 cm Movement: Present    No results found for this or any previous visit (from the past 24 hour(s)).  Assessment & Plan:  1) Low-risk pregnancy G2P1001 at [redacted]w[redacted]d with an Estimated Date of Delivery: 06/26/22   2) Previous UTI, TOC today   Meds: No orders of the defined types were placed in this encounter.  Labs/procedures today: urine TOC; PN2; Tdap given  Plan:  Continue routine obstetrical care   Reviewed: Preterm labor symptoms and  general obstetric precautions including but not limited to vaginal bleeding, contractions, leaking of fluid and fetal movement were reviewed in detail with the patient.  All questions were answered. Didn't ask about home bp cuff. Check bp weekly, let us know if >140/90.   Follow-up: Return in about 3 weeks (around 04/18/2022) for [place], in person.  Orders Placed This Encounter  Procedures   Culture, OB Urine   Tdap vaccine greater than or equal to 7yo IM   Myrtis Ser [facility] 03/28/2022 9:51 AM

## 2022-03-29 LAB — CBC
Hematocrit: 36.1 % (ref 34.0–46.6)
Hemoglobin: 11.8 g/dL (ref 11.1–15.9)
MCH: 27.5 pg (ref 26.6–33.0)
MCHC: 32.7 g/dL (ref 31.5–35.7)
MCV: 84 fL (ref 79–97)
Platelets: 212 10*3/uL (ref 150–450)
RBC: 4.29 x10E6/uL (ref 3.77–5.28)
RDW: 11.9 % (ref 11.7–15.4)
WBC: 7.2 10*3/uL (ref 3.4–10.8)

## 2022-03-29 LAB — ANTIBODY SCREEN: Antibody Screen: NEGATIVE

## 2022-03-29 LAB — GLUCOSE TOLERANCE, 2 HOURS W/ 1HR
Glucose, 1 hour: 132 mg/dL (ref 70–179)
Glucose, 2 hour: 89 mg/dL (ref 70–152)
Glucose, Fasting: 72 mg/dL (ref 70–91)

## 2022-03-29 LAB — RPR: RPR Ser Ql: NONREACTIVE

## 2022-03-29 LAB — HIV ANTIBODY (ROUTINE TESTING W REFLEX): HIV Screen 4th Generation wRfx: NONREACTIVE

## 2022-03-30 LAB — CULTURE, OB URINE

## 2022-03-30 LAB — URINE CULTURE, OB REFLEX

## 2022-04-18 ENCOUNTER — Encounter: Payer: Self-pay | Admitting: Advanced Practice Midwife

## 2022-04-18 ENCOUNTER — Ambulatory Visit (INDEPENDENT_AMBULATORY_CARE_PROVIDER_SITE_OTHER): Payer: Medicaid Other | Admitting: Advanced Practice Midwife

## 2022-04-18 VITALS — BP 101/64 | HR 85 | Wt 216.0 lb

## 2022-04-18 DIAGNOSIS — Z3A30 30 weeks gestation of pregnancy: Secondary | ICD-10-CM

## 2022-04-18 DIAGNOSIS — Z3483 Encounter for supervision of other normal pregnancy, third trimester: Secondary | ICD-10-CM

## 2022-04-18 DIAGNOSIS — Z3480 Encounter for supervision of other normal pregnancy, unspecified trimester: Secondary | ICD-10-CM

## 2022-04-18 NOTE — Progress Notes (Signed)
   LOW-RISK PREGNANCY VISIT Patient name: Nichole [name] MRN [id number]  Date of birth: 08/14/2000 Chief Complaint:   Routine Prenatal Visit  History of Present Illness:   Nichole [name] is a 22 y.o. G72P1001 female at [redacted]w[redacted]d with an Estimated Date of Delivery: 06/26/22 being seen today for ongoing management of a low-risk pregnancy.  Today she reports no complaints. Contractions: Not present. Vag. Bleeding: None.  Movement: Present. denies leaking of fluid. Review of Systems:   Pertinent items are noted in HPI Denies abnormal vaginal discharge w/ itching/odor/irritation, headaches, visual changes, shortness of breath, chest pain, abdominal pain, severe nausea/vomiting, or problems with urination or bowel movements unless otherwise stated above. Pertinent History Reviewed:  Reviewed past medical,surgical, social, obstetrical and family history.  Reviewed problem list, medications and allergies. Physical Assessment:   Vitals:   04/18/22 1504  BP: 101/64  Pulse: 85  Weight: 216 lb (98 kg)  Body mass index is 37.08 kg/m.        Physical Examination:   General appearance: Well appearing, and in no distress  Mental status: Alert, oriented to person, place, and time  Skin: Warm & dry  Cardiovascular: Normal heart rate noted  Respiratory: Normal respiratory effort, no distress  Abdomen: Soft, gravid, nontender  Pelvic: Cervical exam deferred         Extremities: Edema: None  Fetal Status: Fetal Heart Rate (bpm): 148 Fundal Height: 31 cm Movement: Present    No results found for this or any previous visit (from the past 24 hour(s)).  Assessment & Plan:  1) Low-risk pregnancy G2P1001 at [redacted]w[redacted]d with an Estimated Date of Delivery: 06/26/22     Meds: No orders of the defined types were placed in this encounter.  Labs/procedures today: (got Tdap 03/28/22)  Plan:  Continue routine obstetrical care   Reviewed: Preterm labor symptoms and general obstetric precautions including but not  limited to vaginal bleeding, contractions, leaking of fluid and fetal movement were reviewed in detail with the patient.  All questions were answered. Has home bp cuff. Check bp weekly, let us know if >140/90.   Follow-up: Return in about 2 weeks (around 05/02/2022) for [place], in person.  No orders of the defined types were placed in this encounter.  Myrtis Ser CNM 04/18/2022 3:30 PM

## 2022-04-18 NOTE — Patient Instructions (Signed)
Nichole [name], thank you for choosing our office today! We appreciate the opportunity to meet your healthcare needs. You may receive a short survey by mail, e-mail, or through [employer]. If you are happy with your care we would appreciate if you could take just a few minutes to complete the survey questions. We read all of your comments and take your feedback very seriously. Thank you again for choosing our office.  [facility] [employer] Team at [place] [facility] ([place], [place] [address]) Entrance C, located off of [address] parking   CLASSES: Go to [email or web address] to register for classes (childbirth, breastfeeding, waterbirth, infant CPR, daddy bootcamp, etc.)  Call the office [phone]) or go to [facility] if: You begin to have strong, frequent contractions Your water breaks.  Sometimes it is a big gush of fluid, sometimes it is just a trickle that keeps getting your panties wet or running down your legs You have vaginal bleeding.  It is normal to have a small amount of spotting if your cervix was checked.  You don't feel your baby moving like normal.  If you don't, get you something to eat and drink and lay down and focus on feeling your baby move.   If your baby is still not moving like normal, you should call the office or go to [facility].  Call the office [phone]) or go to [facility] hospital for these signs of pre-eclampsia: Severe headache that does not go away with Tylenol Visual changes- seeing spots, double, blurred vision Pain under your right breast or upper abdomen that does not go away with Tums or heartburn medicine Nausea and/or vomiting Severe swelling in your hands, feet, and face   Tdap Vaccine It is recommended that you get the Tdap vaccine during the third trimester of EACH pregnancy to help protect your baby from getting pertussis (whooping cough) 27-36 weeks is the BEST time to do  this so that you can pass the protection on to your baby. During pregnancy is better than after pregnancy, but if you are unable to get it during pregnancy it will be offered at the hospital.  You can get this vaccine with [name], at the health department, your family doctor, or some local pharmacies Everyone who will be around your baby should also be up-to-date on their vaccines before the baby comes. Adults (who are not pregnant) only need 1 dose of Tdap during adulthood.   [facility] Pediatricians/Family Doctors [facility]): [address] [place], [facility]: [address] [place], [phone]                [facility]): [place], [phone] (call to ask if accepting patients) [facility] Department: [address], [place], [place] Pediatricians/Family Doctors [facility]): [place]. [place], [address], [facility]: [address] [place], [facility] [place]: [place], [facility]): [phone] [facility]: [address], [place]: [address] [facility]: [phone], [phone]  Home Blood Pressure Monitoring for Patients   Your provider has recommended that you check your  blood pressure (BP) at least once a week at home. If you do not have a blood pressure cuff at home, one will be provided for you. Contact your provider if you have not received your monitor within 1 week.   Helpful Tips for Accurate Home Blood Pressure Checks  Don't smoke, exercise, or drink caffeine 30 minutes before checking your BP Use the restroom before checking your BP (a full bladder can raise your  pressure) Relax in a comfortable upright chair Feet on the ground Left arm resting comfortably on a flat surface at the level of your heart Legs uncrossed Back supported Sit quietly and don't talk Place the cuff on your bare arm Adjust snuggly, so that only two fingertips can fit between your skin and the top of the cuff Check 2 readings separated by at least one minute Keep a log of your BP readings For a visual, please reference this diagram: http://ccnc.care/bpdiagram  Provider Name: [facility]     Phone: [phone]  Zone 1: ALL CLEAR  Continue to monitor your symptoms:  BP reading is less than 140 (top number) or less than 90 (bottom number)  No right upper stomach pain No headaches or seeing spots No feeling nauseated or throwing up No swelling in face and hands  Zone 2: CAUTION Call your doctor's office for any of the following:  BP reading is greater than 140 (top number) or greater than 90 (bottom number)  Stomach pain under your ribs in the middle or right side Headaches or seeing spots Feeling nauseated or throwing up Swelling in face and hands  Zone 3: EMERGENCY  Seek immediate medical care if you have any of the following:  BP reading is greater than160 (top number) or greater than 110 (bottom number) Severe headaches not improving with Tylenol Serious difficulty catching your breath Any worsening symptoms from Zone 2   Third Trimester of Pregnancy The third trimester is from week 29 through week 42, months 7 through 9. The third trimester is a time when the fetus is growing rapidly. At the end of the ninth month, the fetus is about 20 inches in length and weighs 6-10 pounds.  BODY CHANGES Your body goes through many changes during pregnancy. The changes vary from woman to woman.  Your weight will continue to increase. You can expect to gain 25-35 pounds (11-16 kg) by the end of the pregnancy. You may begin to get stretch marks on your hips, abdomen,  and breasts. You may urinate more often because the fetus is moving lower into your pelvis and pressing on your bladder. You may develop or continue to have heartburn as a result of your pregnancy. You may develop constipation because certain hormones are causing the muscles that push waste through your intestines to slow down. You may develop hemorrhoids or swollen, bulging veins (varicose veins). You may have pelvic pain because of the weight gain and pregnancy hormones relaxing your joints between the bones in your pelvis. Backaches may result from overexertion of the muscles supporting your posture. You may have changes in your hair. These can include thickening of your hair, rapid growth, and changes in texture. Some women also have hair loss during or after pregnancy, or hair that feels dry or thin. Your hair will most likely return to normal after your baby is born. Your breasts will continue to grow and be tender. A yellow discharge may leak from your breasts called colostrum. Your belly button may stick out. You may   feel short of breath because of your expanding uterus. You may notice the fetus "dropping," or moving lower in your abdomen. You may have a bloody mucus discharge. This usually occurs a few days to a week before labor begins. Your cervix becomes thin and soft (effaced) near your due date. WHAT TO EXPECT AT YOUR PRENATAL EXAMS  You will have prenatal exams every 2 weeks until week 36. Then, you will have weekly prenatal exams. During a routine prenatal visit: You will be weighed to make sure you and the fetus are growing normally. Your blood pressure is taken. Your abdomen will be measured to track your baby's growth. The fetal heartbeat will be listened to. Any test results from the previous visit will be discussed. You may have a cervical check near your due date to see if you have effaced. At around 36 weeks, your caregiver will check your cervix. At the same time, your  caregiver will also perform a test on the secretions of the vaginal tissue. This test is to determine if a type of bacteria, Group B streptococcus, is present. Your caregiver will explain this further. Your caregiver may ask you: What your birth plan is. How you are feeling. If you are feeling the baby move. If you have had any abnormal symptoms, such as leaking fluid, bleeding, severe headaches, or abdominal cramping. If you have any questions. Other tests or screenings that may be performed during your third trimester include: Blood tests that check for low iron levels (anemia). Fetal testing to check the health, activity level, and growth of the fetus. Testing is done if you have certain medical conditions or if there are problems during the pregnancy. FALSE LABOR You may feel small, irregular contractions that eventually go away. These are called Braxton Hicks contractions, or false labor. Contractions may last for hours, days, or even weeks before true labor sets in. If contractions come at regular intervals, intensify, or become painful, it is best to be seen by your caregiver.  SIGNS OF LABOR  Menstrual-like cramps. Contractions that are 5 minutes apart or less. Contractions that start on the top of the uterus and spread down to the lower abdomen and back. A sense of increased pelvic pressure or back pain. A watery or bloody mucus discharge that comes from the vagina. If you have any of these signs before the 37th week of pregnancy, call your caregiver right away. You need to go to the hospital to get checked immediately. HOME CARE INSTRUCTIONS  Avoid all smoking, herbs, alcohol, and unprescribed drugs. These chemicals affect the formation and growth of the baby. Follow your caregiver's instructions regarding medicine use. There are medicines that are either safe or unsafe to take during pregnancy. Exercise only as directed by your caregiver. Experiencing uterine cramps is a good sign to  stop exercising. Continue to eat regular, healthy meals. Wear a good support bra for breast tenderness. Do not use hot tubs, steam rooms, or saunas. Wear your seat belt at all times when driving. Avoid raw meat, uncooked cheese, cat litter boxes, and soil used by cats. These carry germs that can cause birth defects in the baby. Take your prenatal vitamins. Try taking a stool softener (if your caregiver approves) if you develop constipation. Eat more high-fiber foods, such as fresh vegetables or fruit and whole grains. Drink plenty of fluids to keep your urine clear or pale yellow. Take warm sitz baths to soothe any pain or discomfort caused by hemorrhoids. Use hemorrhoid cream if   your caregiver approves. If you develop varicose veins, wear support hose. Elevate your feet for 15 minutes, 3-4 times a day. Limit salt in your diet. Avoid heavy lifting, wear low heal shoes, and practice good posture. Rest a lot with your legs elevated if you have leg cramps or low back pain. Visit your dentist if you have not gone during your pregnancy. Use a soft toothbrush to brush your teeth and be gentle when you floss. A sexual relationship may be continued unless your caregiver directs you otherwise. Do not travel far distances unless it is absolutely necessary and only with the approval of your caregiver. Take prenatal classes to understand, practice, and ask questions about the labor and delivery. Make a trial run to the hospital. Pack your hospital bag. Prepare the baby's nursery. Continue to go to all your prenatal visits as directed by your caregiver. SEEK MEDICAL CARE IF: You are unsure if you are in labor or if your water has broken. You have dizziness. You have mild pelvic cramps, pelvic pressure, or nagging pain in your abdominal area. You have persistent nausea, vomiting, or diarrhea. You have a bad smelling vaginal discharge. You have pain with urination. SEEK IMMEDIATE MEDICAL CARE IF:  You  have a fever. You are leaking fluid from your vagina. You have spotting or bleeding from your vagina. You have severe abdominal cramping or pain. You have rapid weight loss or gain. You have shortness of breath with chest pain. You notice sudden or extreme swelling of your face, hands, ankles, feet, or legs. You have not felt your baby move in over an hour. You have severe headaches that do not go away with medicine. You have vision changes. Document Released: 02/27/2001 Document Revised: 03/10/2013 Document Reviewed: 05/06/2012 [facility] Patient Information 2015 [place], [place]. This information is not intended to replace advice given to you by your health care provider. Make sure you discuss any questions you have with your health care provider.

## 2022-05-02 ENCOUNTER — Ambulatory Visit (INDEPENDENT_AMBULATORY_CARE_PROVIDER_SITE_OTHER): Payer: Medicaid Other | Admitting: Advanced Practice Midwife

## 2022-05-02 VITALS — BP 106/71 | HR 89 | Wt 218.0 lb

## 2022-05-02 DIAGNOSIS — Z3A32 32 weeks gestation of pregnancy: Secondary | ICD-10-CM

## 2022-05-02 DIAGNOSIS — Z3483 Encounter for supervision of other normal pregnancy, third trimester: Secondary | ICD-10-CM

## 2022-05-02 NOTE — Patient Instructions (Signed)
Nichole [name], thank you for choosing our office today! We appreciate the opportunity to meet your healthcare needs. You may receive a short survey by mail, e-mail, or through [employer]. If you are happy with your care we would appreciate if you could take just a few minutes to complete the survey questions. We read all of your comments and take your feedback very seriously. Thank you again for choosing our office.  Center for [employer] Team at [place] at [facility] ([place], [place] [address]) Entrance C, located off of [address] parking   CLASSES: Go to [email or web address] to register for classes (childbirth, breastfeeding, waterbirth, infant CPR, daddy bootcamp, etc.)  Call the office [phone]) or go to [facility] if: You begin to have strong, frequent contractions Your water breaks.  Sometimes it is a big gush of fluid, sometimes it is just a trickle that keeps getting your panties wet or running down your legs You have vaginal bleeding.  It is normal to have a small amount of spotting if your cervix was checked.  You don't feel your baby moving like normal.  If you don't, get you something to eat and drink and lay down and focus on feeling your baby move.   If your baby is still not moving like normal, you should call the office or go to [facility].  Call the office [phone]) or go to [facility] hospital for these signs of pre-eclampsia: Severe headache that does not go away with Tylenol Visual changes- seeing spots, double, blurred vision Pain under your right breast or upper abdomen that does not go away with Tums or heartburn medicine Nausea and/or vomiting Severe swelling in your hands, feet, and face   Tdap Vaccine It is recommended that you get the Tdap vaccine during the third trimester of EACH pregnancy to help protect your baby from getting pertussis (whooping cough) 27-36 weeks is the BEST time to do  this so that you can pass the protection on to your baby. During pregnancy is better than after pregnancy, but if you are unable to get it during pregnancy it will be offered at the hospital.  You can get this vaccine with [name], at the health department, your family doctor, or some local pharmacies Everyone who will be around your baby should also be up-to-date on their vaccines before the baby comes. Adults (who are not pregnant) only need 1 dose of Tdap during adulthood.   [facility] Pediatricians/Family Doctors [facility]): [address] [place], [facility]: [address] [place], [phone]                [facility]): [place], [phone] (call to ask if accepting patients) [facility] Department: [address], [place], [place] Pediatricians/Family Doctors [facility]): [place] [place], [address], [facility]: [address] [place], [place] [facility] [place]: [place], [place] [facility]): [phone] [facility]: [address] [place]: [address], [place] [facility]: [phone], [phone]  Home Blood Pressure Monitoring for Patients   Your provider has recommended that you check your  blood pressure (BP) at least once a week at home. If you do not have a blood pressure cuff at home, one will be provided for you. Contact your provider if you have not received your monitor within 1 week.   Helpful Tips for Accurate Home Blood Pressure Checks  Don't smoke, exercise, or drink caffeine 30 minutes before checking your BP Use the restroom before checking your BP (a full bladder can raise your  pressure) Relax in a comfortable upright chair Feet on the ground Left arm resting comfortably on a flat surface at the level of your heart Legs uncrossed Back supported Sit quietly and don't talk Place the cuff on your bare arm Adjust snuggly, so that only two fingertips can fit between your skin and the top of the cuff Check 2 readings separated by at least one minute Keep a log of your BP readings For a visual, please reference this diagram: http://ccnc.care/bpdiagram  Provider Name: [facility]     Phone: [phone]  Zone 1: ALL CLEAR  Continue to monitor your symptoms:  BP reading is less than 140 (top number) or less than 90 (bottom number)  No right upper stomach pain No headaches or seeing spots No feeling nauseated or throwing up No swelling in face and hands  Zone 2: CAUTION Call your doctor's office for any of the following:  BP reading is greater than 140 (top number) or greater than 90 (bottom number)  Stomach pain under your ribs in the middle or right side Headaches or seeing spots Feeling nauseated or throwing up Swelling in face and hands  Zone 3: EMERGENCY  Seek immediate medical care if you have any of the following:  BP reading is greater than160 (top number) or greater than 110 (bottom number) Severe headaches not improving with Tylenol Serious difficulty catching your breath Any worsening symptoms from Zone 2   Third Trimester of Pregnancy The third trimester is from week 29 through week 42, months 7 through 9. The third trimester is a time when the fetus is growing rapidly. At the end of the ninth month, the fetus is about 20 inches in length and weighs 6-10 pounds.  BODY CHANGES Your body goes through many changes during pregnancy. The changes vary from woman to woman.  Your weight will continue to increase. You can expect to gain 25-35 pounds (11-16 kg) by the end of the pregnancy. You may begin to get stretch marks on your hips, abdomen,  and breasts. You may urinate more often because the fetus is moving lower into your pelvis and pressing on your bladder. You may develop or continue to have heartburn as a result of your pregnancy. You may develop constipation because certain hormones are causing the muscles that push waste through your intestines to slow down. You may develop hemorrhoids or swollen, bulging veins (varicose veins). You may have pelvic pain because of the weight gain and pregnancy hormones relaxing your joints between the bones in your pelvis. Backaches may result from overexertion of the muscles supporting your posture. You may have changes in your hair. These can include thickening of your hair, rapid growth, and changes in texture. Some women also have hair loss during or after pregnancy, or hair that feels dry or thin. Your hair will most likely return to normal after your baby is born. Your breasts will continue to grow and be tender. A yellow discharge may leak from your breasts called colostrum. Your belly button may stick out. You may   feel short of breath because of your expanding uterus. You may notice the fetus "dropping," or moving lower in your abdomen. You may have a bloody mucus discharge. This usually occurs a few days to a week before labor begins. Your cervix becomes thin and soft (effaced) near your due date. WHAT TO EXPECT AT YOUR PRENATAL EXAMS  You will have prenatal exams every 2 weeks until week 36. Then, you will have weekly prenatal exams. During a routine prenatal visit: You will be weighed to make sure you and the fetus are growing normally. Your blood pressure is taken. Your abdomen will be measured to track your baby's growth. The fetal heartbeat will be listened to. Any test results from the previous visit will be discussed. You may have a cervical check near your due date to see if you have effaced. At around 36 weeks, your caregiver will check your cervix. At the same time, your  caregiver will also perform a test on the secretions of the vaginal tissue. This test is to determine if a type of bacteria, Group B streptococcus, is present. Your caregiver will explain this further. Your caregiver may ask you: What your birth plan is. How you are feeling. If you are feeling the baby move. If you have had any abnormal symptoms, such as leaking fluid, bleeding, severe headaches, or abdominal cramping. If you have any questions. Other tests or screenings that may be performed during your third trimester include: Blood tests that check for low iron levels (anemia). Fetal testing to check the health, activity level, and growth of the fetus. Testing is done if you have certain medical conditions or if there are problems during the pregnancy. FALSE LABOR You may feel small, irregular contractions that eventually go away. These are called Braxton Hicks contractions, or false labor. Contractions may last for hours, days, or even weeks before true labor sets in. If contractions come at regular intervals, intensify, or become painful, it is best to be seen by your caregiver.  SIGNS OF LABOR  Menstrual-like cramps. Contractions that are 5 minutes apart or less. Contractions that start on the top of the uterus and spread down to the lower abdomen and back. A sense of increased pelvic pressure or back pain. A watery or bloody mucus discharge that comes from the vagina. If you have any of these signs before the 37th week of pregnancy, call your caregiver right away. You need to go to the hospital to get checked immediately. HOME CARE INSTRUCTIONS  Avoid all smoking, herbs, alcohol, and unprescribed drugs. These chemicals affect the formation and growth of the baby. Follow your caregiver's instructions regarding medicine use. There are medicines that are either safe or unsafe to take during pregnancy. Exercise only as directed by your caregiver. Experiencing uterine cramps is a good sign to  stop exercising. Continue to eat regular, healthy meals. Wear a good support bra for breast tenderness. Do not use hot tubs, steam rooms, or saunas. Wear your seat belt at all times when driving. Avoid raw meat, uncooked cheese, cat litter boxes, and soil used by cats. These carry germs that can cause birth defects in the baby. Take your prenatal vitamins. Try taking a stool softener (if your caregiver approves) if you develop constipation. Eat more high-fiber foods, such as fresh vegetables or fruit and whole grains. Drink plenty of fluids to keep your urine clear or pale yellow. Take warm sitz baths to soothe any pain or discomfort caused by hemorrhoids. Use hemorrhoid cream if   your caregiver approves. If you develop varicose veins, wear support hose. Elevate your feet for 15 minutes, 3-4 times a day. Limit salt in your diet. Avoid heavy lifting, wear low heal shoes, and practice good posture. Rest a lot with your legs elevated if you have leg cramps or low back pain. Visit your dentist if you have not gone during your pregnancy. Use a soft toothbrush to brush your teeth and be gentle when you floss. A sexual relationship may be continued unless your caregiver directs you otherwise. Do not travel far distances unless it is absolutely necessary and only with the approval of your caregiver. Take prenatal classes to understand, practice, and ask questions about the labor and delivery. Make a trial run to the hospital. Pack your hospital bag. Prepare the baby's nursery. Continue to go to all your prenatal visits as directed by your caregiver. SEEK MEDICAL CARE IF: You are unsure if you are in labor or if your water has broken. You have dizziness. You have mild pelvic cramps, pelvic pressure, or nagging pain in your abdominal area. You have persistent nausea, vomiting, or diarrhea. You have a bad smelling vaginal discharge. You have pain with urination. SEEK IMMEDIATE MEDICAL CARE IF:  You  have a fever. You are leaking fluid from your vagina. You have spotting or bleeding from your vagina. You have severe abdominal cramping or pain. You have rapid weight loss or gain. You have shortness of breath with chest pain. You notice sudden or extreme swelling of your face, hands, ankles, feet, or legs. You have not felt your baby move in over an hour. You have severe headaches that do not go away with medicine. You have vision changes. Document Released: 02/27/2001 Document Revised: 03/10/2013 Document Reviewed: 05/06/2012 [facility] Patient Information 2015 [place], [place]. This information is not intended to replace advice given to you by your health care provider. Make sure you discuss any questions you have with your health care provider.

## 2022-05-02 NOTE — Progress Notes (Signed)
   LOW-RISK PREGNANCY VISIT Patient name: Nichole [name] MRN [id number]  Date of birth: 2000/12/11 Chief Complaint:   Routine Prenatal Visit  History of Present Illness:   Nichole [name] is a 22 y.o. G67P1001 female at [redacted]w[redacted]d with an Estimated Date of Delivery: 06/26/22 being seen today for ongoing management of a low-risk pregnancy.  Today she reports no complaints. Contractions: Not present. Vag. Bleeding: None.  Movement: Present. denies leaking of fluid. Review of Systems:   Pertinent items are noted in HPI Denies abnormal vaginal discharge w/ itching/odor/irritation, headaches, visual changes, shortness of breath, chest pain, abdominal pain, severe nausea/vomiting, or problems with urination or bowel movements unless otherwise stated above. Pertinent History Reviewed:  Reviewed past medical,surgical, social, obstetrical and family history.  Reviewed problem list, medications and allergies. Physical Assessment:   Vitals:   05/02/22 1523  BP: 106/71  Pulse: 89  Weight: 218 lb (98.9 kg)  Body mass index is 37.42 kg/m.        Physical Examination:   General appearance: Well appearing, and in no distress  Mental status: Alert, oriented to person, place, and time  Skin: Warm & dry  Cardiovascular: Normal heart rate noted  Respiratory: Normal respiratory effort, no distress  Abdomen: Soft, gravid, nontender  Pelvic: Cervical exam deferred         Extremities:    Fetal Status: Fetal Heart Rate (bpm): 135 Fundal Height: 32 cm Movement: Present    No results found for this or any previous visit (from the past 24 hour(s)).  Assessment & Plan:  1) Low-risk pregnancy G2P1001 at [redacted]w[redacted]d with an Estimated Date of Delivery: 06/26/22     Meds: No orders of the defined types were placed in this encounter.  Labs/procedures today: none  Plan:  Continue routine obstetrical care   Reviewed: Preterm labor symptoms and general obstetric precautions including but not limited to vaginal  bleeding, contractions, leaking of fluid and fetal movement were reviewed in detail with the patient.  All questions were answered. Didn't ask about home bp cuff. Check bp weekly, let us know if >140/90.   Follow-up: Return in about 2 weeks (around 05/16/2022) for [place], in person.  No orders of the defined types were placed in this encounter.  Myrtis Ser CNM 05/02/2022 3:42 PM

## 2022-05-15 ENCOUNTER — Encounter: Payer: Self-pay | Admitting: Women's Health

## 2022-05-15 ENCOUNTER — Ambulatory Visit (INDEPENDENT_AMBULATORY_CARE_PROVIDER_SITE_OTHER): Payer: Medicaid Other | Admitting: Women's Health

## 2022-05-15 VITALS — BP 107/69 | HR 86 | Wt 217.0 lb

## 2022-05-15 DIAGNOSIS — Z3480 Encounter for supervision of other normal pregnancy, unspecified trimester: Secondary | ICD-10-CM

## 2022-05-15 DIAGNOSIS — Z3A34 34 weeks gestation of pregnancy: Secondary | ICD-10-CM

## 2022-05-15 DIAGNOSIS — Z3483 Encounter for supervision of other normal pregnancy, third trimester: Secondary | ICD-10-CM

## 2022-05-15 NOTE — Progress Notes (Signed)
LOW-RISK PREGNANCY VISIT Patient name: Nichole [name] MRN [id number]  Date of birth: Jun 19, 2000 Chief Complaint:   Routine Prenatal Visit  History of Present Illness:   Nichole [name] is a 22 y.o. G80P1001 female at 6w0dwith an Estimated Date of Delivery: 06/26/22 being seen today for ongoing management of a low-risk pregnancy.   Today she reports no complaints. Contractions: Irritability.  .  Movement: Present. denies leaking of fluid.     03/28/2022    9:13 AM 12/19/2021    3:21 PM 10/24/2021   10:19 AM 09/15/2021   11:17 AM 06/21/2021   11:53 AM  Depression screen PHQ 2/9  Decreased Interest 1 1 0 0 1  Down, Depressed, Hopeless 0 0 0 0 0  PHQ - 2 Score 1 1 0 0 1  Altered sleeping 1 1 0  1  Tired, decreased energy 1 1 0  1  Change in appetite 0 1 0  1  Feeling bad or failure about yourself  0 0 0  0  Trouble concentrating 0 0 0  0  Moving slowly or fidgety/restless 0 0 0  0  Suicidal thoughts 0 0 0  0  PHQ-9 Score 3 4 0  4  Difficult doing work/chores   Not difficult at all          03/28/2022    9:14 AM 12/05/2021   11:42 AM 10/24/2021   10:19 AM 09/15/2021   11:17 AM  GAD 7 : Generalized Anxiety Score  Nervous, Anxious, on Edge 0 0 0 0  Control/stop worrying 0 0 0 0  Worry too much - different things 0 0 0 0  Trouble relaxing 0 0 0 0  Restless 0 0 0 0  Easily annoyed or irritable 0 0 0 0  Afraid - awful might happen 0 0 0 0  Total GAD 7 Score 0 0 0 0  Anxiety Difficulty   Not difficult at all Not difficult at all      Review of Systems:   Pertinent items are noted in HPI Denies abnormal vaginal discharge w/ itching/odor/irritation, headaches, visual changes, shortness of breath, chest pain, abdominal pain, severe nausea/vomiting, or problems with urination or bowel movements unless otherwise stated above. Pertinent History Reviewed:  Reviewed past medical,surgical, social, obstetrical and family history.  Reviewed problem list, medications and  allergies. Physical Assessment:   Vitals:   05/15/22 1518  BP: 107/69  Pulse: 86  Weight: 217 lb (98.4 kg)  Body mass index is 37.25 kg/m.        Physical Examination:   General appearance: Well appearing, and in no distress  Mental status: Alert, oriented to person, place, and time  Skin: Warm & dry  Cardiovascular: Normal heart rate noted  Respiratory: Normal respiratory effort, no distress  Abdomen: Soft, gravid, nontender  Pelvic: Cervical exam deferred         Extremities: Edema: None  Fetal Status: Fetal Heart Rate (bpm): 131 Fundal Height: 34 cm Movement: Present    Chaperone: N/A   No results found for this or any previous visit (from the past 24 hour(s)).  Assessment & Plan:  1) Low-risk pregnancy G2P1001 at 337w0dith an Estimated Date of Delivery: 06/26/22   2) H/O PPH   Meds: No orders of the defined types were placed in this encounter.  Labs/procedures today: declined flu shot  Plan:  Continue routine obstetrical care  Next visit: prefers will be in person for cultures  Reviewed: Preterm labor symptoms and general obstetric precautions including but not limited to vaginal bleeding, contractions, leaking of fluid and fetal movement were reviewed in detail with the patient.  All questions were answered. Does have home bp cuff. Office bp cuff given: not applicable. Check bp weekly, let us know if consistently >140 and/or >90.  Follow-up: Return in about 2 weeks (around 05/29/2022) for [place], [place], in person.  No future appointments.  No orders of the defined types were placed in this encounter.  [place], [facility] 05/15/2022 3:38 PM

## 2022-05-15 NOTE — Patient Instructions (Signed)
Drema Pry, thank you for choosing our office today! We appreciate the opportunity to meet your healthcare needs. You may receive a short survey by mail, e-mail, or through [employer]. If you are happy with your care we would appreciate if you could take just a few minutes to complete the survey questions. We read all of your comments and take your feedback very seriously. Thank you again for choosing our office.  [facility] [employer] Team at [place] at [facility] ([place], [place] [address]) Entrance C, located off of [address] [place] parking   CLASSES: Go to [email or web address] to register for classes (childbirth, breastfeeding, waterbirth, infant CPR, daddy bootcamp, etc.)  Call the office [phone]) or go to [facility] if: You begin to have strong, frequent contractions Your water breaks.  Sometimes it is a big gush of fluid, sometimes it is just a trickle that keeps getting your panties wet or running down your legs You have vaginal bleeding.  It is normal to have a small amount of spotting if your cervix was checked.  You don't feel your baby moving like normal.  If you don't, get you something to eat and drink and lay down and focus on feeling your baby move.   If your baby is still not moving like normal, you should call the office or go to [facility].  Call the office [phone]) or go to [facility] hospital for these signs of pre-eclampsia: Severe headache that does not go away with Tylenol Visual changes- seeing spots, double, blurred vision Pain under your right breast or upper abdomen that does not go away with Tums or heartburn medicine Nausea and/or vomiting Severe swelling in your hands, feet, and face   Tdap Vaccine It is recommended that you get the Tdap vaccine during the third trimester of EACH pregnancy to help protect your baby from getting pertussis (whooping cough) 27-36 weeks is the BEST time to do  this so that you can pass the protection on to your baby. During pregnancy is better than after pregnancy, but if you are unable to get it during pregnancy it will be offered at the hospital.  You can get this vaccine with [name], at the health department, your family doctor, or some local pharmacies Everyone who will be around your baby should also be up-to-date on their vaccines before the baby comes. Adults (who are not pregnant) only need 1 dose of Tdap during adulthood.   [facility] Pediatricians/Family Doctors [facility]): [address] [place], [facility]: [address] [place], [phone]                [facility]): [place], [phone] (call to ask if accepting patients) [facility] Department: [address], [place], [place] Pediatricians/Family Doctors [facility]): [place]. [place], [address], [facility]: [address] [place], [facility] [place]: [place], [facility]): [phone] [facility]: [address], [place]: [address] [facility]: [phone], [phone]  Home Blood Pressure Monitoring for Patients   Your provider has recommended that you check your  blood pressure (BP) at least once a week at home. If you do not have a blood pressure cuff at home, one will be provided for you. Contact your provider if you have not received your monitor within 1 week.   Helpful Tips for Accurate Home Blood Pressure Checks  Don't smoke, exercise, or drink caffeine 30 minutes before checking your BP Use the restroom before checking your BP (a full bladder can raise your  pressure) Relax in a comfortable upright chair Feet on the ground Left arm resting comfortably on a flat surface at the level of your heart Legs uncrossed Back supported Sit quietly and don't talk Place the cuff on your bare arm Adjust snuggly, so that only two fingertips can fit between your skin and the top of the cuff Check 2 readings separated by at least one minute Keep a log of your BP readings For a visual, please reference this diagram: http://ccnc.care/bpdiagram  Provider Name: [facility]     Phone: [phone]  Zone 1: ALL CLEAR  Continue to monitor your symptoms:  BP reading is less than 140 (top number) or less than 90 (bottom number)  No right upper stomach pain No headaches or seeing spots No feeling nauseated or throwing up No swelling in face and hands  Zone 2: CAUTION Call your doctor's office for any of the following:  BP reading is greater than 140 (top number) or greater than 90 (bottom number)  Stomach pain under your ribs in the middle or right side Headaches or seeing spots Feeling nauseated or throwing up Swelling in face and hands  Zone 3: EMERGENCY  Seek immediate medical care if you have any of the following:  BP reading is greater than160 (top number) or greater than 110 (bottom number) Severe headaches not improving with Tylenol Serious difficulty catching your breath Any worsening symptoms from Zone 2  Preterm Labor and Birth Information  The normal length of a pregnancy is 39-41 weeks. Preterm labor is when labor starts before 37 completed weeks of pregnancy. What are the risk factors for preterm labor? Preterm labor is more likely to occur in women who: Have certain infections during pregnancy such as a bladder infection, sexually transmitted infection, or infection inside the uterus (chorioamnionitis). Have a shorter-than-normal cervix. Have gone into preterm labor before. Have had surgery on their cervix. Are younger than age 17  or older than age 35. Are African American. Are pregnant with twins or multiple babies (multiple gestation). Take street drugs or smoke while pregnant. Do not gain enough weight while pregnant. Became pregnant shortly after having been pregnant. What are the symptoms of preterm labor? Symptoms of preterm labor include: Cramps similar to those that can happen during a menstrual period. The cramps may happen with diarrhea. Pain in the abdomen or lower back. Regular uterine contractions that may feel like tightening of the abdomen. A feeling of increased pressure in the pelvis. Increased watery or bloody mucus discharge from the vagina. Water breaking (ruptured amniotic sac). Why is it important to recognize signs of preterm labor? It is important to recognize signs of preterm labor because babies who are born prematurely may not be fully developed. This can put them at an increased risk for: Long-term (chronic) heart and lung problems. Difficulty immediately after birth with regulating body systems, including blood sugar, body temperature, heart rate, and breathing rate. Bleeding in the brain. Cerebral palsy. Learning difficulties. Death. These risks are highest for babies who are born before 34 weeks   of pregnancy. How is preterm labor treated? Treatment depends on the length of your pregnancy, your condition, and the health of your baby. It may involve: Having a stitch (suture) placed in your cervix to prevent your cervix from opening too early (cerclage). Taking or being given medicines, such as: Hormone medicines. These may be given early in pregnancy to help support the pregnancy. Medicine to stop contractions. Medicines to help mature the baby's lungs. These may be prescribed if the risk of delivery is high. Medicines to prevent your baby from developing cerebral palsy. If the labor happens before 34 weeks of pregnancy, you may need to stay in the hospital. What should I do if I  think I am in preterm labor? If you think that you are going into preterm labor, call your health care provider right away. How can I prevent preterm labor in future pregnancies? To increase your chance of having a full-term pregnancy: Do not use any tobacco products, such as cigarettes, chewing tobacco, and e-cigarettes. If you need help quitting, ask your health care provider. Do not use street drugs or medicines that have not been prescribed to you during your pregnancy. Talk with your health care provider before taking any herbal supplements, even if you have been taking them regularly. Make sure you gain a healthy amount of weight during your pregnancy. Watch for infection. If you think that you might have an infection, get it checked right away. Make sure to tell your health care provider if you have gone into preterm labor before. This information is not intended to replace advice given to you by your health care provider. Make sure you discuss any questions you have with your health care provider. Document Revised: 06/27/2018 Document Reviewed: 07/27/2015 Elsevier Patient Education  2020 Elsevier Inc.   

## 2022-05-29 ENCOUNTER — Other Ambulatory Visit (HOSPITAL_COMMUNITY)
Admission: RE | Admit: 2022-05-29 | Discharge: 2022-05-29 | Disposition: A | Discharge status: Home / Self Care | Payer: Medicaid Other | Source: Ambulatory Visit | Attending: Women's Health | Admitting: Women's Health

## 2022-05-29 ENCOUNTER — Ambulatory Visit (INDEPENDENT_AMBULATORY_CARE_PROVIDER_SITE_OTHER): Payer: Medicaid Other | Admitting: Women's Health

## 2022-05-29 ENCOUNTER — Encounter: Payer: Self-pay | Admitting: Women's Health

## 2022-05-29 VITALS — BP 112/68 | HR 85 | Wt 219.4 lb

## 2022-05-29 DIAGNOSIS — Z3480 Encounter for supervision of other normal pregnancy, unspecified trimester: Secondary | ICD-10-CM

## 2022-05-29 DIAGNOSIS — Z3A36 36 weeks gestation of pregnancy: Secondary | ICD-10-CM

## 2022-05-29 DIAGNOSIS — Z3483 Encounter for supervision of other normal pregnancy, third trimester: Secondary | ICD-10-CM

## 2022-05-29 NOTE — Progress Notes (Signed)
LOW-RISK PREGNANCY VISIT Patient name: Nichole [name] MRN [id number]  Date of birth: 2001-02-10 Chief Complaint:   Routine Prenatal Visit  History of Present Illness:   Nichole [name] is a 22 y.o. G28P1001 female at 62w0dwith an Estimated Date of Delivery: 06/26/22 being seen today for ongoing management of a low-risk pregnancy.   Today she reports  spider veins in legs, swelling in vulva . Contractions: Not present. Vag. Bleeding: None.  Movement: Present. denies leaking of fluid.     03/28/2022    9:13 AM 12/19/2021    3:21 PM 10/24/2021   10:19 AM 09/15/2021   11:17 AM 06/21/2021   11:53 AM  Depression screen PHQ 2/9  Decreased Interest 1 1 0 0 1  Down, Depressed, Hopeless 0 0 0 0 0  PHQ - 2 Score 1 1 0 0 1  Altered sleeping 1 1 0  1  Tired, decreased energy 1 1 0  1  Change in appetite 0 1 0  1  Feeling bad or failure about yourself  0 0 0  0  Trouble concentrating 0 0 0  0  Moving slowly or fidgety/restless 0 0 0  0  Suicidal thoughts 0 0 0  0  PHQ-9 Score 3 4 0  4  Difficult doing work/chores   Not difficult at all          03/28/2022    9:14 AM 12/05/2021   11:42 AM 10/24/2021   10:19 AM 09/15/2021   11:17 AM  GAD 7 : Generalized Anxiety Score  Nervous, Anxious, on Edge 0 0 0 0  Control/stop worrying 0 0 0 0  Worry too much - different things 0 0 0 0  Trouble relaxing 0 0 0 0  Restless 0 0 0 0  Easily annoyed or irritable 0 0 0 0  Afraid - awful might happen 0 0 0 0  Total GAD 7 Score 0 0 0 0  Anxiety Difficulty   Not difficult at all Not difficult at all      Review of Systems:   Pertinent items are noted in HPI Denies abnormal vaginal discharge w/ itching/odor/irritation, headaches, visual changes, shortness of breath, chest pain, abdominal pain, severe nausea/vomiting, or problems with urination or bowel movements unless otherwise stated above. Pertinent History Reviewed:  Reviewed past medical,surgical, social, obstetrical and family history.   Reviewed problem list, medications and allergies. Physical Assessment:   Vitals:   05/29/22 1601  BP: 112/68  Pulse: 85  Weight: 219 lb 6.4 oz (99.5 kg)  Body mass index is 37.66 kg/m.        Physical Examination:   General appearance: Well appearing, and in no distress  Mental status: Alert, oriented to person, place, and time  Skin: Warm & dry  Cardiovascular: Normal heart rate noted  Respiratory: Normal respiratory effort, no distress  Abdomen: Soft, gravid, nontender  Pelvic: Cervical exam performed vulvar appears normal, no excess edema Dilation: 1 Effacement (%): Thick Station: -3  Extremities: Edema: Trace, few spider veins  Fetal Status: Fetal Heart Rate (bpm): 142 Fundal Height: 36 cm Movement: Present Presentation: Vertex  Chaperone: Latisha Cresenzo   No results found for this or any previous visit (from the past 24 hour(s)).  Assessment & Plan:  1) Low-risk pregnancy G2P1001 at 318w0dith an Estimated Date of Delivery: 06/26/22   2) Spider veins, can try thigh high compression hose   Meds: No orders of the defined types were placed in this  encounter.  Labs/procedures today: GBS, GC/CT, and SVE  Plan:  Continue routine obstetrical care  Next visit: prefers in person    Reviewed: Preterm labor symptoms and general obstetric precautions including but not limited to vaginal bleeding, contractions, leaking of fluid and fetal movement were reviewed in detail with the patient.  All questions were answered. Does have home bp cuff. Office bp cuff given: not applicable. Check bp weekly, let us know if consistently >140 and/or >90.  Follow-up: Return for weekly, LROB, CNM, in person.  No future appointments.  Orders Placed This Encounter  Procedures   Culture, beta strep (group b only)   Roma Schanz CNM, [facility] 05/29/2022 4:49 PM

## 2022-05-29 NOTE — Patient Instructions (Signed)
Reta, thank you for choosing our office today! We appreciate the opportunity to meet your healthcare needs. You may receive a short survey by mail, e-mail, or through MyChart. If you are happy with your care we would appreciate if you could take just a few minutes to complete the survey questions. We read all of your comments and take your feedback very seriously. Thank you again for choosing our office.  [facility] [place] ([address] [place], [place] [address]) Entrance C, located off of [address] Free 24/7 valet parking   CLASSES: Go to [email or web address] to register for classes (childbirth, breastfeeding, waterbirth, infant CPR, daddy bootcamp, etc.)  Call the office ([phone]) or go to [facility] if: You begin to have strong, frequent contractions Your water breaks.  Sometimes it is a big gush of fluid, sometimes it is just a trickle that keeps getting your panties wet or running down your legs You have vaginal bleeding.  It is normal to have a small amount of spotting if your cervix was checked.  You don't feel your baby moving like normal.  If you don't, get you something to eat and drink and lay down and focus on feeling your baby move.   If your baby is still not moving like normal, you should call the office or go to [facility].  Call the office ([phone]) or [facility] to [facility] for these signs of pre-eclampsia: Severe headache that does not go away with Tylenol Visual changes- seeing spots, double, blurred vision Pain under your right breast or upper abdomen that does not go away with Tums or heartburn medicine Nausea and/or vomiting Severe swelling in your hands, feet, and face   Tdap Vaccine It is recommended that you get the Tdap vaccine during the third trimester of EACH pregnancy to help protect your baby from getting pertussis (whooping cough) 27-36 weeks is the BEST time to do  this so that you can pass the protection on to your baby. During pregnancy is better than after pregnancy, but if you are unable to get it during pregnancy it will be offered at the hospital.  You can get this vaccine with us, at the health department, your family doctor, or some local pharmacies Everyone who will be around your baby should also be up-to-date on their vaccines before the baby comes. Adults (who are not pregnant) only need 1 dose of Tdap during adulthood.   [place] Pediatricians/Family Doctors [facility] ([facility]): [address], [phone]           [facility]: [address], [phone]                [facility] ([facility]): [address], [phone] (call to ask if accepting patients) [facility]: [address], [place], [phone]    [place] Pediatricians/Family Doctors [facility] ([facility]): [address], [phone] [facility]: [address], [phone] [facility]: [address], [phone]  [place] Family Doctors  [facility] ([facility]): [phone] [facility]: [address], [phone]   [place] Family Doctors [facility]: [address], [phone]  [place] Family Doctors  [facility]: [address], [phone]  Home Blood Pressure Monitoring for Patients   Your provider has recommended that you check your   blood pressure (BP) at least once a week at home. If you do not have a blood pressure cuff at home, one will be provided for you. Contact your provider if you have not received your monitor within 1 week.   Helpful Tips for Accurate Home Blood Pressure Checks  Don't smoke, exercise, or drink caffeine 30 minutes before checking your BP Use the restroom before checking your BP (a full bladder can raise your  pressure) Relax in a comfortable upright chair Feet on the ground Left arm resting comfortably on a flat surface at the level of your heart Legs uncrossed Back supported Sit quietly and don't talk Place the cuff on your bare arm Adjust snuggly, so that only two fingertips can fit between your skin and the top of the cuff Check 2 readings separated by at least one minute Keep a log of your BP readings For a visual, please reference this diagram: http://ccnc.care/bpdiagram  Provider Name: [facility]     Phone: [phone]  Zone 1: ALL CLEAR  Continue to monitor your symptoms:  BP reading is less than 140 (top number) or less than 90 (bottom number)  No right upper stomach pain No headaches or seeing spots No feeling nauseated or throwing up No swelling in face and hands  Zone 2: CAUTION Call your doctor's office for any of the following:  BP reading is greater than 140 (top number) or greater than 90 (bottom number)  Stomach pain under your ribs in the middle or right side Headaches or seeing spots Feeling nauseated or throwing up Swelling in face and hands  Zone 3: EMERGENCY  Seek immediate medical care if you have any of the following:  BP reading is greater than160 (top number) or greater than 110 (bottom number) Severe headaches not improving with Tylenol Serious difficulty catching your breath Any worsening symptoms from Zone 2  Preterm Labor and Birth Information  The normal length of a pregnancy is 39-41 weeks. Preterm labor is when labor starts before 37 completed weeks of pregnancy. What are the risk factors for preterm labor? Preterm labor is more likely to occur in women who: Have certain infections during pregnancy such as a bladder infection, sexually transmitted infection, or infection inside the uterus (chorioamnionitis). Have a shorter-than-normal cervix. Have gone into preterm labor before. Have had surgery on their cervix. Are younger than age 17  or older than age 35. Are African American. Are pregnant with twins or multiple babies (multiple gestation). Take street drugs or smoke while pregnant. Do not gain enough weight while pregnant. Became pregnant shortly after having been pregnant. What are the symptoms of preterm labor? Symptoms of preterm labor include: Cramps similar to those that can happen during a menstrual period. The cramps may happen with diarrhea. Pain in the abdomen or lower back. Regular uterine contractions that may feel like tightening of the abdomen. A feeling of increased pressure in the pelvis. Increased watery or bloody mucus discharge from the vagina. Water breaking (ruptured amniotic sac). Why is it important to recognize signs of preterm labor? It is important to recognize signs of preterm labor because babies who are born prematurely may not be fully developed. This can put them at an increased risk for: Long-term (chronic) heart and lung problems. Difficulty immediately after birth with regulating body systems, including blood sugar, body temperature, heart rate, and breathing rate. Bleeding in the brain. Cerebral palsy. Learning difficulties. Death. These risks are highest for babies who are born before 34 weeks   of pregnancy. How is preterm labor treated? Treatment depends on the length of your pregnancy, your condition, and the health of your baby. It may involve: Having a stitch (suture) placed in your cervix to prevent your cervix from opening too early (cerclage). Taking or being given medicines, such as: Hormone medicines. These may be given early in pregnancy to help support the pregnancy. Medicine to stop contractions. Medicines to help mature the baby's lungs. These may be prescribed if the risk of delivery is high. Medicines to prevent your baby from developing cerebral palsy. If the labor happens before 34 weeks of pregnancy, you may need to stay in the hospital. What should I do if I  think I am in preterm labor? If you think that you are going into preterm labor, call your health care provider right away. How can I prevent preterm labor in future pregnancies? To increase your chance of having a full-term pregnancy: Do not use any tobacco products, such as cigarettes, chewing tobacco, and e-cigarettes. If you need help quitting, ask your health care provider. Do not use street drugs or medicines that have not been prescribed to you during your pregnancy. Talk with your health care provider before taking any herbal supplements, even if you have been taking them regularly. Make sure you gain a healthy amount of weight during your pregnancy. Watch for infection. If you think that you might have an infection, get it checked right away. Make sure to tell your health care provider if you have gone into preterm labor before. This information is not intended to replace advice given to you by your health care provider. Make sure you discuss any questions you have with your health care provider. Document Revised: 06/27/2018 Document Reviewed: 07/27/2015 Elsevier Patient Education  2020 Elsevier Inc.   

## 2022-05-30 DIAGNOSIS — Z3480 Encounter for supervision of other normal pregnancy, unspecified trimester: Secondary | ICD-10-CM | POA: Diagnosis not present

## 2022-05-30 DIAGNOSIS — Z3A36 36 weeks gestation of pregnancy: Secondary | ICD-10-CM | POA: Diagnosis not present

## 2022-05-31 LAB — CERVICOVAGINAL ANCILLARY ONLY
Chlamydia: NEGATIVE
Comment: NEGATIVE
Comment: NORMAL
Neisseria Gonorrhea: NEGATIVE

## 2022-06-02 LAB — CULTURE, BETA STREP (GROUP B ONLY): Strep Gp B Culture: POSITIVE — AB

## 2022-06-05 ENCOUNTER — Ambulatory Visit (INDEPENDENT_AMBULATORY_CARE_PROVIDER_SITE_OTHER): Payer: Medicaid Other | Admitting: Women's Health

## 2022-06-05 ENCOUNTER — Encounter: Payer: Self-pay | Admitting: Women's Health

## 2022-06-05 VITALS — BP 112/69 | HR 95 | Wt 219.4 lb

## 2022-06-05 DIAGNOSIS — Z3A37 37 weeks gestation of pregnancy: Secondary | ICD-10-CM

## 2022-06-05 DIAGNOSIS — Z3483 Encounter for supervision of other normal pregnancy, third trimester: Secondary | ICD-10-CM

## 2022-06-05 DIAGNOSIS — Z3480 Encounter for supervision of other normal pregnancy, unspecified trimester: Secondary | ICD-10-CM

## 2022-06-05 NOTE — Progress Notes (Signed)
LOW-RISK PREGNANCY VISIT Patient name: Nichole [name] MRN [id number]  Date of birth: [birth year] Chief Complaint:   Routine Prenatal Visit (Rt breast look purple. Eye feel foggy)  History of Present Illness:   Nichole [name] is a 22 y.o. G88P1001 female at [redacted]w[redacted]d with an Estimated Date of Delivery: 06/26/22 being seen today for ongoing management of a low-risk pregnancy.   Today she reports  center of chest and into breasts looked purple . Contractions: Irregular.  .  Movement: Present. denies leaking of fluid.     03/28/2022    9:13 AM 12/19/2021    3:21 PM 10/24/2021   10:19 AM 09/15/2021   11:17 AM 06/21/2021   11:53 AM  Depression screen PHQ 2/9  Decreased Interest 1 1 0 0 1  Down, Depressed, Hopeless 0 0 0 0 0  PHQ - 2 Score 1 1 0 0 1  Altered sleeping 1 1 0  1  Tired, decreased energy 1 1 0  1  Change in appetite 0 1 0  1  Feeling bad or failure about yourself  0 0 0  0  Trouble concentrating 0 0 0  0  Moving slowly or fidgety/restless 0 0 0  0  Suicidal thoughts 0 0 0  0  PHQ-9 Score 3 4 0  4  Difficult doing work/chores   Not difficult at all          03/28/2022    9:14 AM 12/05/2021   11:42 AM 10/24/2021   10:19 AM 09/15/2021   11:17 AM  GAD 7 : Generalized Anxiety Score  Nervous, Anxious, on Edge 0 0 0 0  Control/stop worrying 0 0 0 0  Worry too much - different things 0 0 0 0  Trouble relaxing 0 0 0 0  Restless 0 0 0 0  Easily annoyed or irritable 0 0 0 0  Afraid - awful might happen 0 0 0 0  Total GAD 7 Score 0 0 0 0  Anxiety Difficulty   Not difficult at all Not difficult at all      Review of Systems:   Pertinent items are noted in HPI Denies abnormal vaginal discharge w/ itching/odor/irritation, headaches, visual changes, shortness of breath, chest pain, abdominal pain, severe nausea/vomiting, or problems with urination or bowel movements unless otherwise stated above. Pertinent History Reviewed:  Reviewed past medical,surgical, social, obstetrical  and family history.  Reviewed problem list, medications and allergies. Physical Assessment:   Vitals:   06/05/22 1607  BP: 112/69  Pulse: 95  Weight: 219 lb 6.4 oz (99.5 kg)  Body mass index is 37.66 kg/m.        Physical Examination:   General appearance: Well appearing, and in no distress  Mental status: Alert, oriented to person, place, and time  Skin: Warm & dry  Cardiovascular: Normal heart rate noted  Respiratory: Normal respiratory effort, no distress  Chest: visible veins in chest/bilateral breasts, discussed normal/physiological  Abdomen: Soft, gravid, nontender  Pelvic: Cervical exam deferred         Extremities: Edema: Trace  Fetal Status: Fetal Heart Rate (bpm): 145 Fundal Height: 37 cm Movement: Present    Chaperone: N/A   No results found for this or any previous visit (from the past 24 hour(s)).  Assessment & Plan:  1) Low-risk pregnancy G2P1001 at [redacted]w[redacted]d with an Estimated Date of Delivery: 06/26/22    Meds: No orders of the defined types were placed in this encounter.  Labs/procedures today: none  Plan:  Continue routine obstetrical care  Next visit: prefers in person    Reviewed: Term labor symptoms and general obstetric precautions including but not limited to vaginal bleeding, contractions, leaking of fluid and fetal movement were reviewed in detail with the patient.  All questions were answered. Does have home bp cuff. Office bp cuff given: not applicable. Check bp weekly, let us know if consistently >140 and/or >90.  Follow-up: Return for As scheduled.  Future Appointments  Date Time Provider [place]  06/12/2022  4:10 PM Janyth Pupa, DO [facility]  06/19/2022  4:10 PM Roma Schanz, CNM [facility]  06/26/2022  4:10 PM Roma Schanz, CNM [facility]    No orders of the defined types were placed in this encounter.  [place], [facility] 06/05/2022 4:26 PM

## 2022-06-05 NOTE — Patient Instructions (Signed)
Nichole [name], thank you for choosing our office today! We appreciate the opportunity to meet your healthcare needs. You may receive a short survey by mail, e-mail, or through [employer]. If you are happy with your care we would appreciate if you could take just a few minutes to complete the survey questions. We read all of your comments and take your feedback very seriously. Thank you again for choosing our office.  [facility] for [employer] Team at [facility] ([place], [place] [address]) Entrance C, located off of [address] parking   CLASSES: Go to [email or web address] to register for classes (childbirth, breastfeeding, waterbirth, infant CPR, daddy bootcamp, etc.)  Call the office [phone]) or go to [facility] if: You begin to have strong, frequent contractions Your water breaks.  Sometimes it is a big gush of fluid, sometimes it is just a trickle that keeps getting your panties wet or running down your legs You have vaginal bleeding.  It is normal to have a small amount of spotting if your cervix was checked.  You don't feel your baby moving like normal.  If you don't, get you something to eat and drink and lay down and focus on feeling your baby move.   If your baby is still not moving like normal, you should call the office or go to [facility].  Call the office [phone]) or go to [facility] hospital for these signs of pre-eclampsia: Severe headache that does not go away with Tylenol Visual changes- seeing spots, double, blurred vision Pain under your right breast or upper abdomen that does not go away with Tums or heartburn medicine Nausea and/or vomiting Severe swelling in your hands, feet, and face   [facility] Pediatricians/Family Doctors [facility]): [address] [place], [place]: [address] [place], [place] [facility]): [place], [phone] (call to ask if accepting patients) [facility] Department: [address] [place], [facility]): [address], [place] [facility]: [address] [place], [place] [facility]: [place], [place] [facility]): [phone] [facility]: [address], [place]: [address], [place]: [phone], [phone]  Home Blood Pressure Monitoring for Patients   Your provider has recommended that you check your blood pressure (BP) at least once a week at home. If you do not have a blood pressure cuff at home, one will be provided for you. Contact your provider if you have not received your monitor within 1 week.   Helpful Tips for Accurate Home Blood Pressure Checks  Don't smoke, exercise, or drink caffeine 30 minutes before checking your BP Use the restroom before checking your BP (a full bladder can raise your pressure) Relax in a comfortable upright chair Feet on the ground Left arm resting comfortably on a flat surface at the level of your heart Legs uncrossed Back supported Sit quietly and don't talk Place the cuff on your bare arm Adjust snuggly, so that only two fingertips  can fit between your skin and the top of the cuff Check 2 readings separated by at least one minute Keep a log of your BP readings For a visual, please reference this diagram: http://ccnc.care/bpdiagram  Provider Name: [facility]     Phone: [phone]  Zone 1: ALL CLEAR  Continue to monitor your symptoms:  BP reading is less than 140 (top number) or less than 90 (bottom number)  No right  upper stomach pain No headaches or seeing spots No feeling nauseated or throwing up No swelling in face and hands  Zone 2: CAUTION Call your doctor's office for any of the following:  BP reading is greater than 140 (top number) or greater than 90 (bottom number)  Stomach pain under your ribs in the middle or right side Headaches or seeing spots Feeling nauseated or throwing up Swelling in face and hands  Zone 3: EMERGENCY  Seek immediate medical care if you have any of the following:  BP reading is greater than160 (top number) or greater than 110 (bottom number) Severe headaches not improving with Tylenol Serious difficulty catching your breath Any worsening symptoms from Zone 2   Braxton Hicks Contractions Contractions of the uterus can occur throughout pregnancy, but they are not always a sign that you are in labor. You may have practice contractions called Braxton Hicks contractions. These false labor contractions are sometimes confused with true labor. What are [place] contractions? Braxton Hicks contractions are tightening movements that occur in the muscles of the uterus before labor. Unlike true labor contractions, these contractions do not result in opening (dilation) and thinning of the cervix. Toward the end of pregnancy (32-34 weeks), Braxton Hicks contractions can happen more often and may become stronger. These contractions are sometimes difficult to tell apart from true labor because they can be very uncomfortable. You should not feel embarrassed if you go to the hospital with false labor. Sometimes, the only way to tell if you are in true labor is for your health care provider to look for changes in the cervix. The health care provider will do a physical exam and may monitor your contractions. If you are not in true labor, the exam should show that your cervix is not dilating and your water has not broken. If there are no other health problems associated with your  pregnancy, it is completely safe for you to be sent home with false labor. You may continue to have Braxton Hicks contractions until you go into true labor. How to tell the difference between true labor and false labor True labor Contractions last 30-70 seconds. Contractions become very regular. Discomfort is usually felt in the top of the uterus, and it spreads to the lower abdomen and low back. Contractions do not go away with walking. Contractions usually become more intense and increase in frequency. The cervix dilates and gets thinner. False labor Contractions are usually shorter and not as strong as true labor contractions. Contractions are usually irregular. Contractions are often felt in the front of the lower abdomen and in the groin. Contractions may go away when you walk around or change positions while lying down. Contractions get weaker and are shorter-lasting as time goes on. The cervix usually does not dilate or become thin. Follow these instructions at home:  Take over-the-counter and prescription medicines only as told by your health care provider. Keep up with your usual exercises and follow other instructions from your health care provider. Eat and drink lightly if you think  you are going into labor. If Braxton Hicks contractions are making you uncomfortable: Change your position from lying down or resting to walking, or change from walking to resting. Sit and rest in a tub of warm water. Drink enough fluid to keep your urine pale yellow. Dehydration may cause these contractions. Do slow and deep breathing several times an hour. Keep all follow-up prenatal visits as told by your health care provider. This is important. Contact a health care provider if: You have a fever. You have continuous pain in your abdomen. Get help right away if: Your contractions become stronger, more regular, and closer together. You have fluid leaking or gushing from your vagina. You pass  blood-tinged mucus (bloody show). You have bleeding from your vagina. You have low back pain that you never had before. You feel your baby's head pushing down and causing pelvic pressure. Your baby is not moving inside you as much as it used to. Summary Contractions that occur before labor are called Braxton Hicks contractions, false labor, or practice contractions. Braxton Hicks contractions are usually shorter, weaker, farther apart, and less regular than true labor contractions. True labor contractions usually become progressively stronger and regular, and they become more frequent. Manage discomfort from [facility] contractions by changing position, resting in a warm bath, drinking plenty of water, or practicing deep breathing. This information is not intended to replace advice given to you by your health care provider. Make sure you discuss any questions you have with your health care provider. Document Revised: 02/15/2017 Document Reviewed: 07/19/2016 Elsevier Patient Education  [place].

## 2022-06-12 ENCOUNTER — Ambulatory Visit (INDEPENDENT_AMBULATORY_CARE_PROVIDER_SITE_OTHER): Payer: Medicaid Other | Admitting: Obstetrics & Gynecology

## 2022-06-12 ENCOUNTER — Encounter: Payer: Self-pay | Admitting: Obstetrics & Gynecology

## 2022-06-12 VITALS — BP 115/73 | HR 97 | Wt 219.6 lb

## 2022-06-12 DIAGNOSIS — Z3A38 38 weeks gestation of pregnancy: Secondary | ICD-10-CM

## 2022-06-12 DIAGNOSIS — Z3483 Encounter for supervision of other normal pregnancy, third trimester: Secondary | ICD-10-CM

## 2022-06-12 NOTE — Progress Notes (Signed)
   LOW-RISK PREGNANCY VISIT Patient name: Nichole [name] MRN [id number]  Date of birth: Oct 23, 2000 Chief Complaint:   Routine Prenatal Visit  History of Present Illness:   Nichole [name] is a 22 y.o. G36P1001 female at [redacted]w[redacted]d with an Estimated Date of Delivery: 06/26/22 being seen today for ongoing management of a low-risk pregnancy.      03/28/2022    9:13 AM 12/19/2021    3:21 PM 10/24/2021   10:19 AM 09/15/2021   11:17 AM 06/21/2021   11:53 AM  Depression screen PHQ 2/9  Decreased Interest 1 1 0 0 1  Down, Depressed, Hopeless 0 0 0 0 0  PHQ - 2 Score 1 1 0 0 1  Altered sleeping 1 1 0  1  Tired, decreased energy 1 1 0  1  Change in appetite 0 1 0  1  Feeling bad or failure about yourself  0 0 0  0  Trouble concentrating 0 0 0  0  Moving slowly or fidgety/restless 0 0 0  0  Suicidal thoughts 0 0 0  0  PHQ-9 Score 3 4 0  4  Difficult doing work/chores   Not difficult at all      Today she reports that she is starting to feel pelvic pressure. Contractions: Not present. Vag. Bleeding: None.  Movement: Present. denies leaking of fluid. Review of Systems:   Pertinent items are noted in HPI Denies abnormal vaginal discharge w/ itching/odor/irritation, headaches, visual changes, shortness of breath, chest pain, abdominal pain, severe nausea/vomiting, or problems with urination or bowel movements unless otherwise stated above. Pertinent History Reviewed:  Reviewed past medical,surgical, social, obstetrical and family history.  Reviewed problem list, medications and allergies.  Physical Assessment:   Vitals:   06/12/22 1546  BP: 115/73  Pulse: 97  Weight: 219 lb 9.6 oz (99.6 kg)  Body mass index is 37.69 kg/m.        Physical Examination:   General appearance: Well appearing, and in no distress  Mental status: Alert, oriented to person, place, and time  Skin: Warm & dry  Respiratory: Normal respiratory effort, no distress  Abdomen: Soft, gravid, nontender  Pelvic: Cervical  exam performed  Dilation: 2 Effacement (%): 50 Station: -3  Extremities: Edema: None  Psych:  mood and affect appropriate  Fetal Status: Fetal Heart Rate (bpm): 140 Fundal Height: 37 cm Movement: Present    Chaperone:  Dr. Heber [place]     No results found for this or any previous visit (from the past 24 hour(s)).   Assessment & Plan:  1) Low-risk pregnancy G2P1001 at [redacted]w[redacted]d with an Estimated Date of Delivery: 06/26/22     Meds: No orders of the defined types were placed in this encounter.  Labs/procedures today: none  Plan:  Continue routine obstetrical care  Next visit: prefers in person    Reviewed: Term labor symptoms and general obstetric precautions including but not limited to vaginal bleeding, contractions, leaking of fluid and fetal movement were reviewed in detail with the patient.  All questions were answered.   Follow-up: Return in about 1 week (around 06/19/2022) for [place] visit.  No orders of the defined types were placed in this encounter.   Nichole Pupa, DO Attending [place], [facility] for [employer], [place]

## 2022-06-19 ENCOUNTER — Encounter: Payer: Medicaid Other | Admitting: Women's Health

## 2022-06-22 ENCOUNTER — Ambulatory Visit (INDEPENDENT_AMBULATORY_CARE_PROVIDER_SITE_OTHER): Payer: Medicaid Other | Admitting: Obstetrics & Gynecology

## 2022-06-22 ENCOUNTER — Encounter: Payer: Self-pay | Admitting: Obstetrics & Gynecology

## 2022-06-22 ENCOUNTER — Encounter (HOSPITAL_COMMUNITY): Payer: Self-pay | Admitting: Obstetrics & Gynecology

## 2022-06-22 ENCOUNTER — Inpatient Hospital Stay (HOSPITAL_COMMUNITY)
Admission: AD | Admit: 2022-06-22 | Discharge: 2022-06-24 | DRG: 807 | Disposition: A | Discharge status: Home / Self Care | Payer: Medicaid Other | Attending: Obstetrics & Gynecology | Admitting: Obstetrics & Gynecology

## 2022-06-22 VITALS — BP 118/74 | HR 90 | Wt 222.0 lb

## 2022-06-22 DIAGNOSIS — Z141 Cystic fibrosis carrier: Secondary | ICD-10-CM

## 2022-06-22 DIAGNOSIS — Z3483 Encounter for supervision of other normal pregnancy, third trimester: Secondary | ICD-10-CM

## 2022-06-22 DIAGNOSIS — O99824 Streptococcus B carrier state complicating childbirth: Principal | ICD-10-CM | POA: Diagnosis present

## 2022-06-22 DIAGNOSIS — J45909 Unspecified asthma, uncomplicated: Secondary | ICD-10-CM | POA: Diagnosis present

## 2022-06-22 DIAGNOSIS — Z3A39 39 weeks gestation of pregnancy: Secondary | ICD-10-CM

## 2022-06-22 DIAGNOSIS — Z8759 Personal history of other complications of pregnancy, childbirth and the puerperium: Secondary | ICD-10-CM

## 2022-06-22 DIAGNOSIS — O9952 Diseases of the respiratory system complicating childbirth: Secondary | ICD-10-CM | POA: Diagnosis present

## 2022-06-22 DIAGNOSIS — O2342 Unspecified infection of urinary tract in pregnancy, second trimester: Principal | ICD-10-CM

## 2022-06-22 MED ORDER — LACTATED RINGERS IV SOLN: INTRAVENOUS | Status: DC

## 2022-06-22 NOTE — H&P (Signed)
Nichole [name] is a 22 y.o. 232P1001 female at 1516w3d by 1971w1d u/s presenting for SOL.   Reports active fetal movement, contractions: regular, every 4-5 minutes, vaginal bleeding: none, membranes: intact.  Initiated prenatal care at [facility] at 13.0 wks.   Most recent u/s: 23wk, EFW 42%, nl fluid, post placenta.   This pregnancy complicated by: # CF carrier (FOB declines testing) # [facility]  Prenatal History/Complications:  # hx immediate PPH (1950cc) # term vag del 2022  Past Medical History: Past Medical History:  Diagnosis Date   Asthma     Past Surgical History: Past Surgical History:  Procedure Laterality Date   NO PAST SURGERIES      Obstetrical History: OB History     Gravida  2   Para  1   Term  1   Preterm      AB      Living  1      SAB      IAB      Ectopic      Multiple  0   Live Births  1           Social History: Social History   Socioeconomic History   Marital status: Single    Spouse name: Not on file   Number of children: Not on file   Years of education: Not on file   Highest education level: Not on file  Occupational History   Not on file  Tobacco Use   Smoking status: Never   Smokeless tobacco: Never  Vaping Use   Vaping Use: Never used  Substance and Sexual Activity   Alcohol use: No   Drug use: No   Sexual activity: Yes    Birth control/protection: None  Other Topics Concern   Not on file  Social History Narrative   Not on file   Social Determinants of Health   Financial Resource Strain: Low Risk  (12/19/2021)   Overall Financial Resource Strain (CARDIA)    Difficulty of Paying Living Expenses: Not hard at all  Food Insecurity: No Food Insecurity (12/19/2021)   Hunger Vital Sign    Worried About Running Out of Food in the Last Year: Never true    Ran Out of Food in the Last Year: Never true  Transportation Needs: No Transportation Needs (12/19/2021)   PRAPARE - Scientist, research (physical sciences)Transportation    Lack of  Transportation (Medical): No    Lack of Transportation (Non-Medical): No  Physical Activity: Insufficiently Active (12/19/2021)   Exercise Vital Sign    Days of Exercise per Week: 2 days    Minutes of Exercise per Session: 30 min  Stress: No Stress Concern Present (12/19/2021)   [employer] - Occupational Stress Questionnaire    Feeling of Stress : Not at all  Social Connections: Moderately Integrated (12/19/2021)   Social Connection and Isolation Panel [NHANES]    Frequency of Communication with Friends and Family: Twice a week    Frequency of Social Gatherings with Friends and Family: Once a week    Attends Religious Services: 1 to 4 times per year    Active Member of [employer] Financial[employer]ubs or Organizations: No    Attends Engineer, structuralClub or Organization Meetings: Never    Marital Status: Married    Family History: Family History  Problem Relation Age of Onset   Diabetes Father     Allergies: No Known Allergies  Medications Prior to Admission  Medication Sig Dispense Refill Last Dose  albuterol (VENTOLIN HFA) 108 (90 Base) MCG/ACT inhaler Inhale 2 puffs into the lungs every 6 (six) hours as needed. 18 g 2 Past Month   Prenatal Vit-Fe Fumarate-FA (PRENATAL VITAMIN PO) Take by mouth.   Past Week    Review of Systems  Pertinent pos/neg as indicated in HPI  Blood pressure 112/73, pulse 100, temperature 97.8 F (36.6 C), temperature source Oral, resp. rate 18, height 5\' 4"  (1.626 m), weight 100.6 kg, currently breastfeeding. General appearance: alert, cooperative, and mild distress Lungs: clear to auscultation bilaterally Heart: regular rate and rhythm Abdomen: gravid, soft, non-tender, EFW by Leopold's approximately 7lbs Extremities: tr edema  Fetal monitoring: FHR: 130s bpm, variability: moderate,  Accelerations: Present,  decelerations:  Absent Uterine activity: Frequency: Every 4-5 minutes Dilation: 5 Effacement (%): 80 Station: -2 Exam by:: DCallaway,  RN Presentation: cephalic   Prenatal labs: ABO, Rh: O/Positive/-- (10/03 1617) Antibody: Negative (01/10 0818) Rubella: 1.03 (10/03 1617) RPR: Non Reactive (01/10 0818)  HBsAg: Negative (10/03 1617)  HIV: Non Reactive (01/10 0818)  GBS: Positive/-- (03/13 1130)  2hr GTT: 72/132/89  Prenatal Transfer Tool  Maternal Diabetes: No Genetic Screening: Normal Maternal Ultrasounds/Referrals: Normal Fetal Ultrasounds or other Referrals:  None Maternal Substance Abuse:  No Significant Maternal Medications:  None Significant Maternal Lab Results: Group B Strep positive  No results found for this or any previous visit (from the past 24 hour(s)).   Assessment:  [redacted]w[redacted]d SIUP  G2P1001  SOL  Cat 1 FHR  GBS Positive/-- (03/13 1130)  Plan:  Admit to L&D  IV pain meds/epidural prn active labor  Expectant management  Anticipate vag del   Plans to breastfeed  Contraception: condoms  Circumcision: no  Arabella Merles CNM 06/22/2022, 11:40 PM

## 2022-06-22 NOTE — H&P (Incomplete)
Nichole [name] is a 22 y.o. 482P1001 female at 762w3d by *** presenting ***.   Reports {Fetal Movement:20184} fetal movement, contractions: {obgyn contractions reg/irreg:312982}, vaginal bleeding: {desc; vaginal bleeding:14141}, membranes: {pe membranes intrapartum:314523}.  Initiated prenatal care at *** at *** wks.   Most recent u/s ***.   This pregnancy complicated by: ***  Prenatal History/Complications:  ***  Past Medical History: Past Medical History:  Diagnosis Date  . Asthma     Past Surgical History: Past Surgical History:  Procedure Laterality Date  . NO PAST SURGERIES      Obstetrical History: OB History     Gravida  2   Para  1   Term  1   Preterm      AB      Living  1      SAB      IAB      Ectopic      Multiple  0   Live Births  1           Social History: Social History   Socioeconomic History  . Marital status: Single    Spouse name: Not on file  . Number of children: Not on file  . Years of education: Not on file  . Highest education level: Not on file  Occupational History  . Not on file  Tobacco Use  . Smoking status: Never  . Smokeless tobacco: Never  Vaping Use  . Vaping Use: Never used  Substance and Sexual Activity  . Alcohol use: No  . Drug use: No  . Sexual activity: Yes    Birth control/protection: None  Other Topics Concern  . Not on file  Social History Narrative  . Not on file   Social Determinants of Health   Financial Resource Strain: Low Risk  (12/19/2021)   Overall Financial Resource Strain (CARDIA)   . Difficulty of Paying Living Expenses: Not hard at all  Food Insecurity: No Food Insecurity (12/19/2021)   Hunger Vital Sign   . Worried About Programme researcher, broadcasting/film/videounning Out of Food in the Last Year: Never true   . Ran Out of Food in the Last Year: Never true  Transportation Needs: No Transportation Needs (12/19/2021)   PRAPARE - Transportation   . Lack of Transportation (Medical): No   . Lack of Transportation  (Non-Medical): No  Physical Activity: Insufficiently Active (12/19/2021)   Exercise Vital Sign   . Days of Exercise per Week: 2 days   . Minutes of Exercise per Session: 30 min  Stress: No Stress Concern Present (12/19/2021)   [employer] - Occupational Stress Questionnaire   . Feeling of Stress : Not at all  Social Connections: Moderately Integrated (12/19/2021)   Social Connection and Isolation Panel [NHANES]   . Frequency of Communication with Friends and Family: Twice a week   . Frequency of Social Gatherings with Friends and Family: Once a week   . Attends Religious Services: 1 to 4 times per year   . Active Member of Clubs or Organizations: No   . Attends BankerClub or Organization Meetings: Never   . Marital Status: Married    Family History: Family History  Problem Relation Age of Onset  . Diabetes Father     Allergies: No Known Allergies  Medications Prior to Admission  Medication Sig Dispense Refill Last Dose  . albuterol (VENTOLIN HFA) 108 (90 Base) MCG/ACT inhaler Inhale 2 puffs into the lungs every 6 (six) hours as needed. 18 g 2  Past Month  . Prenatal Vit-Fe Fumarate-FA (PRENATAL VITAMIN PO) Take by mouth.   Past Week    Review of Systems  Pertinent pos/neg as indicated in HPI  Blood pressure 112/73, pulse 100, temperature 97.8 F (36.6 C), temperature source Oral, resp. rate 18, height 5\' 4"  (1.626 m), weight 100.6 kg, currently breastfeeding. General appearance: {general exam:16600} Lungs: clear to auscultation bilaterally Heart: regular rate and rhythm Abdomen: gravid, soft, non-tender, EFW by Leopold's approximately ***lbs Extremities: *** edema DTR's ***  Fetal monitoring: FHR: *** bpm, variability: {fhr variability:21617},  Accelerations: {Accelerations:21618},  decelerations:  {fhr decel present:21619} Uterine activity: {Uterine contractions:31516} Dilation: 5 Effacement (%): 80 Station: -2 Exam by:: DCallaway,  RN Presentation: {desc; fetal presentation:14558}   Prenatal labs: ABO, Rh: O/Positive/-- (10/03 1617) Antibody: Negative (01/10 0818) Rubella: 1.03 (10/03 1617) RPR: Non Reactive (01/10 0818)  HBsAg: Negative (10/03 1617)  HIV: Non Reactive (01/10 0818)  GBS: Positive/-- (03/13 1130)  2hr GTT: ***  Prenatal Transfer Tool  Maternal Diabetes: {Maternal Diabetes:3043596} Genetic Screening: {Genetic Screening:20205} Maternal Ultrasounds/Referrals: {Maternal Ultrasounds / Referrals:20211} Fetal Ultrasounds or other Referrals:  {Fetal Ultrasounds or Other Referrals:20213} Maternal Substance Abuse:  {Maternal Substance Abuse:20223} Significant Maternal Medications:  {Significant Maternal Meds:20233} Significant Maternal Lab Results: {Significant Maternal Lab Results:20235}  No results found for this or any previous visit (from the past 24 hour(s)).   Assessment:  [redacted]w[redacted]d SIUP  G2P1001  ***  Cat *** FHR  GBS Positive/-- (03/13 1130)  Plan:  Admit to L&D  IV pain meds/epidural prn active labor  ***  Anticipate ***   Plans to ***feed  Contraception: ***  Circumcision: ***  Arabella Merles CNM 06/22/2022, 11:40 PM

## 2022-06-22 NOTE — Progress Notes (Signed)
   LOW-RISK PREGNANCY VISIT Patient name: Nichole [name] MRN [id number]  Date of birth: 29-Aug-2000 Chief Complaint:   Routine Prenatal Visit  History of Present Illness:   Nichole [name] is a 22 y.o. G51P1001 female at [redacted]w[redacted]d with an Estimated Date of Delivery: 06/26/22 being seen today for ongoing management of a low-risk pregnancy.     03/28/2022    9:13 AM 12/19/2021    3:21 PM 10/24/2021   10:19 AM 09/15/2021   11:17 AM 06/21/2021   11:53 AM  Depression screen PHQ 2/9  Decreased Interest 1 1 0 0 1  Down, Depressed, Hopeless 0 0 0 0 0  PHQ - 2 Score 1 1 0 0 1  Altered sleeping 1 1 0  1  Tired, decreased energy 1 1 0  1  Change in appetite 0 1 0  1  Feeling bad or failure about yourself  0 0 0  0  Trouble concentrating 0 0 0  0  Moving slowly or fidgety/restless 0 0 0  0  Suicidal thoughts 0 0 0  0  PHQ-9 Score 3 4 0  4  Difficult doing work/chores   Not difficult at all      Today she reports no complaints. Contractions: Irritability. Vag. Bleeding: None.  Movement: Present. denies leaking of fluid. Review of Systems:   Pertinent items are noted in HPI Denies abnormal vaginal discharge w/ itching/odor/irritation, headaches, visual changes, shortness of breath, chest pain, abdominal pain, severe nausea/vomiting, or problems with urination or bowel movements unless otherwise stated above. Pertinent History Reviewed:  Reviewed past medical,surgical, social, obstetrical and family history.  Reviewed problem list, medications and allergies. Physical Assessment:   Vitals:   06/22/22 1235  BP: 118/74  Pulse: 90  Weight: 222 lb (100.7 kg)  Body mass index is 38.11 kg/m.        Physical Examination:   General appearance: Well appearing, and in no distress  Mental status: Alert, oriented to person, place, and time  Skin: Warm & dry  Cardiovascular: Normal heart rate noted  Respiratory: Normal respiratory effort, no distress  Abdomen: Soft, gravid, nontender  Pelvic:  Cervical exam performed 3/50/-3 membranes swept per pt request Dilation: 3 Effacement (%): 50 Station: -3  Extremities:    Fetal Status: Fetal Heart Rate (bpm): 136 Fundal Height: 40 cm Movement: Present Presentation: Vertex  Chaperone: Amanda Rash    No results found for this or any previous visit (from the past 24 hour(s)).  Assessment & Plan:  1) Low-risk pregnancy G2P1001 at [redacted]w[redacted]d with an Estimated Date of Delivery: 06/26/22      Meds: No orders of the defined types were placed in this encounter.  Labs/procedures today:   Plan:  Continue routine obstetrical care  Next visit: prefers in person      Follow-up: Return in about 1 week (around 06/29/2022) for LROB.  No orders of the defined types were placed in this encounter.   Lazaro Arms, MD 06/22/2022 1:19 PM

## 2022-06-22 NOTE — MAU Note (Signed)
Pt says UC's strong 915pm [facility] VE- 2 cm Denies HSV GBS- positive

## 2022-06-23 ENCOUNTER — Encounter (HOSPITAL_COMMUNITY): Payer: Self-pay | Admitting: Anesthesiology

## 2022-06-23 ENCOUNTER — Encounter (HOSPITAL_COMMUNITY): Payer: Self-pay | Admitting: Obstetrics & Gynecology

## 2022-06-23 ENCOUNTER — Other Ambulatory Visit: Payer: Self-pay

## 2022-06-23 DIAGNOSIS — Z3A39 39 weeks gestation of pregnancy: Secondary | ICD-10-CM | POA: Diagnosis not present

## 2022-06-23 DIAGNOSIS — O9952 Diseases of the respiratory system complicating childbirth: Secondary | ICD-10-CM | POA: Diagnosis not present

## 2022-06-23 DIAGNOSIS — O99824 Streptococcus B carrier state complicating childbirth: Secondary | ICD-10-CM | POA: Diagnosis not present

## 2022-06-23 DIAGNOSIS — O26893 Other specified pregnancy related conditions, third trimester: Secondary | ICD-10-CM | POA: Diagnosis not present

## 2022-06-23 DIAGNOSIS — O99820 Streptococcus B carrier state complicating pregnancy: Secondary | ICD-10-CM | POA: Diagnosis not present

## 2022-06-23 DIAGNOSIS — Z141 Cystic fibrosis carrier: Secondary | ICD-10-CM | POA: Diagnosis not present

## 2022-06-23 DIAGNOSIS — J45909 Unspecified asthma, uncomplicated: Secondary | ICD-10-CM | POA: Diagnosis not present

## 2022-06-23 LAB — TYPE AND SCREEN: Antibody Screen: NEGATIVE

## 2022-06-23 LAB — CBC
HCT: 28.3 % — ABNORMAL LOW (ref 36.0–46.0)
HCT: 39.1 % (ref 36.0–46.0)
Hemoglobin: 12.9 g/dL (ref 12.0–15.0)
Hemoglobin: 9.5 g/dL — ABNORMAL LOW (ref 12.0–15.0)
MCH: 26 pg (ref 26.0–34.0)
MCH: 26 pg (ref 26.0–34.0)
MCHC: 33 g/dL (ref 30.0–36.0)
MCHC: 33.6 g/dL (ref 30.0–36.0)
MCV: 77.5 fL — ABNORMAL LOW (ref 80.0–100.0)
MCV: 78.7 fL — ABNORMAL LOW (ref 80.0–100.0)
Platelets: 192 10*3/uL (ref 150–400)
Platelets: 205 10*3/uL (ref 150–400)
RBC: 3.65 MIL/uL — ABNORMAL LOW (ref 3.87–5.11)
RBC: 4.97 MIL/uL (ref 3.87–5.11)
RDW: 14.2 % (ref 11.5–15.5)
RDW: 14.4 % (ref 11.5–15.5)
WBC: 10.6 10*3/uL — ABNORMAL HIGH (ref 4.0–10.5)
WBC: 14.5 10*3/uL — ABNORMAL HIGH (ref 4.0–10.5)
nRBC: 0 % (ref 0.0–0.2)
nRBC: 0 % (ref 0.0–0.2)

## 2022-06-23 LAB — RPR: RPR Ser Ql: NONREACTIVE

## 2022-06-23 MED ORDER — SENNOSIDES-DOCUSATE SODIUM 8.6-50 MG PO TABS
2.0000 | ORAL_TABLET | ORAL | Status: DC
  Administered 2022-06-23 – 2022-06-24 (×2): 2 via ORAL
  Filled 2022-06-23 (×2): qty 2

## 2022-06-23 MED ORDER — MISOPROSTOL 200 MCG PO TABS
400.0000 ug | ORAL_TABLET | Freq: Once | ORAL | Status: AC
  Administered 2022-06-23: 400 ug via RECTAL

## 2022-06-23 MED ORDER — ACETAMINOPHEN 325 MG PO TABS
650.0000 mg | ORAL_TABLET | ORAL | Status: DC | PRN
  Administered 2022-06-23: 650 mg via ORAL
  Filled 2022-06-23 (×2): qty 2

## 2022-06-23 MED ORDER — SOD CITRATE-CITRIC ACID 500-334 MG/5ML PO SOLN: 30.0000 mL | ORAL | Status: DC | PRN

## 2022-06-23 MED ORDER — MEASLES, MUMPS & RUBELLA VAC IJ SOLR: 0.5000 mL | Freq: Once | INTRAMUSCULAR | Status: DC

## 2022-06-23 MED ORDER — MISOPROSTOL 200 MCG PO TABS
ORAL_TABLET | ORAL | Status: AC
  Filled 2022-06-23: qty 4

## 2022-06-23 MED ORDER — ONDANSETRON HCL 4 MG PO TABS: 4.0000 mg | ORAL_TABLET | ORAL | Status: DC | PRN

## 2022-06-23 MED ORDER — ZOLPIDEM TARTRATE 5 MG PO TABS: 5.0000 mg | ORAL_TABLET | Freq: Every evening | ORAL | Status: DC | PRN

## 2022-06-23 MED ORDER — OXYCODONE HCL 5 MG PO TABS: 5.0000 mg | ORAL_TABLET | ORAL | Status: DC | PRN

## 2022-06-23 MED ORDER — TRANEXAMIC ACID-NACL 1000-0.7 MG/100ML-% IV SOLN: 1000.0000 mg | INTRAVENOUS | Status: DC

## 2022-06-23 MED ORDER — TETANUS-DIPHTH-ACELL PERTUSSIS 5-2.5-18.5 LF-MCG/0.5 IM SUSY: 0.5000 mL | PREFILLED_SYRINGE | Freq: Once | INTRAMUSCULAR | Status: DC

## 2022-06-23 MED ORDER — COCONUT OIL OIL: 1.0000 | TOPICAL_OIL | Status: DC | PRN

## 2022-06-23 MED ORDER — PENICILLIN G POT IN DEXTROSE 60000 UNIT/ML IV SOLN: 3.0000 10*6.[IU] | INTRAVENOUS | Status: DC

## 2022-06-23 MED ORDER — PRENATAL MULTIVITAMIN CH
1.0000 | ORAL_TABLET | Freq: Every day | ORAL | Status: DC
  Administered 2022-06-23 – 2022-06-24 (×2): 1 via ORAL
  Filled 2022-06-23 (×2): qty 1

## 2022-06-23 MED ORDER — SODIUM CHLORIDE 0.9 % IV SOLN
5.0000 10*6.[IU] | Freq: Once | INTRAVENOUS | Status: AC
  Administered 2022-06-23: 5 10*6.[IU] via INTRAVENOUS
  Filled 2022-06-23: qty 5

## 2022-06-23 MED ORDER — WITCH HAZEL-GLYCERIN EX PADS: 1.0000 | MEDICATED_PAD | CUTANEOUS | Status: DC | PRN

## 2022-06-23 MED ORDER — OXYTOCIN-SODIUM CHLORIDE 30-0.9 UT/500ML-% IV SOLN
2.5000 [IU]/h | INTRAVENOUS | Status: DC
  Filled 2022-06-23: qty 500

## 2022-06-23 MED ORDER — FENTANYL CITRATE (PF) 100 MCG/2ML IJ SOLN
100.0000 ug | INTRAMUSCULAR | Status: DC | PRN
  Administered 2022-06-23: 100 ug via INTRAVENOUS
  Filled 2022-06-23 (×2): qty 2

## 2022-06-23 MED ORDER — TRANEXAMIC ACID-NACL 1000-0.7 MG/100ML-% IV SOLN
INTRAVENOUS | Status: AC
  Administered 2022-06-23: 1000 mg
  Filled 2022-06-23: qty 100

## 2022-06-23 MED ORDER — OXYTOCIN BOLUS FROM INFUSION
333.0000 mL | Freq: Once | INTRAVENOUS | Status: AC
  Administered 2022-06-23: 333 mL via INTRAVENOUS

## 2022-06-23 MED ORDER — IRON SUCROSE 500 MG IVPB - SIMPLE MED
500.0000 mg | Freq: Once | INTRAVENOUS | Status: AC
  Administered 2022-06-23: 500 mg via INTRAVENOUS
  Filled 2022-06-23: qty 275

## 2022-06-23 MED ORDER — IBUPROFEN 600 MG PO TABS
600.0000 mg | ORAL_TABLET | Freq: Four times a day (QID) | ORAL | Status: DC
  Administered 2022-06-23 – 2022-06-24 (×6): 600 mg via ORAL
  Filled 2022-06-23 (×6): qty 1

## 2022-06-23 MED ORDER — OXYCODONE-ACETAMINOPHEN 5-325 MG PO TABS: 1.0000 | ORAL_TABLET | ORAL | Status: DC | PRN

## 2022-06-23 MED ORDER — MISOPROSTOL 200 MCG PO TABS
400.0000 ug | ORAL_TABLET | Freq: Once | ORAL | Status: AC
  Administered 2022-06-23: 400 ug via BUCCAL

## 2022-06-23 MED ORDER — ACETAMINOPHEN 325 MG PO TABS: 650.0000 mg | ORAL_TABLET | ORAL | Status: DC | PRN

## 2022-06-23 MED ORDER — DIBUCAINE (PERIANAL) 1 % EX OINT: 1.0000 | TOPICAL_OINTMENT | CUTANEOUS | Status: DC | PRN

## 2022-06-23 MED ORDER — ONDANSETRON HCL 4 MG/2ML IJ SOLN: 4.0000 mg | INTRAMUSCULAR | Status: DC | PRN

## 2022-06-23 MED ORDER — LACTATED RINGERS IV SOLN: 500.0000 mL | INTRAVENOUS | Status: DC | PRN

## 2022-06-23 MED ORDER — ONDANSETRON HCL 4 MG/2ML IJ SOLN: 4.0000 mg | Freq: Four times a day (QID) | INTRAMUSCULAR | Status: DC | PRN

## 2022-06-23 MED ORDER — SIMETHICONE 80 MG PO CHEW: 80.0000 mg | CHEWABLE_TABLET | ORAL | Status: DC | PRN

## 2022-06-23 MED ORDER — LIDOCAINE HCL (PF) 1 % IJ SOLN
30.0000 mL | INTRAMUSCULAR | Status: AC | PRN
  Administered 2022-06-23: 30 mL via SUBCUTANEOUS
  Filled 2022-06-23: qty 30

## 2022-06-23 MED ORDER — OXYCODONE-ACETAMINOPHEN 5-325 MG PO TABS: 2.0000 | ORAL_TABLET | ORAL | Status: DC | PRN

## 2022-06-23 MED ORDER — BENZOCAINE-MENTHOL 20-0.5 % EX AERO
1.0000 | INHALATION_SPRAY | CUTANEOUS | Status: DC | PRN
  Filled 2022-06-23: qty 56

## 2022-06-23 MED ORDER — FENTANYL CITRATE (PF) 100 MCG/2ML IJ SOLN
50.0000 ug | Freq: Once | INTRAMUSCULAR | Status: AC
  Administered 2022-06-23: 50 ug via INTRAVENOUS

## 2022-06-23 MED ORDER — DIPHENHYDRAMINE HCL 25 MG PO CAPS: 25.0000 mg | ORAL_CAPSULE | Freq: Four times a day (QID) | ORAL | Status: DC | PRN

## 2022-06-23 NOTE — Lactation Note (Signed)
This note was copied from a baby's chart. Lactation Consultation Note  Patient Name: Nichole [name] [name] Date: 06/23/2022 Age:22 hours Reason for consult: Initial assessment;Term (per mom baby last was fed a bottle at 7 am. LC reviewed supply and demand , importance of latching the baby so the baby learns the latch and helps to bring the milk in. [facility] encouraged to call with feeding cues. Hold off on formula)   Maternal Data Does the patient have breastfeeding experience prior to this delivery?: Yes How long did the patient breastfeed?: per mom 1st baby Breast fed 8 months, and milk supply decreased, then formula shortage, went back to pumping  Feeding Mother's Current Feeding Choice: Breast Milk and Formula Nipple Type: Slow - flow  LATCH Score - none        Interventions Interventions: Breast feeding basics reviewed;Education;LC Services brochure  Discharge Pump: Stork Pump [facility] sent a referral for a Stork pump)  Consult Status Consult Status: Follow-up Date: 06/23/22 Follow-up type: In-patient    [name] 06/23/2022, 9:21 AM

## 2022-06-23 NOTE — Anesthesia Preprocedure Evaluation (Deleted)
Anesthesia Evaluation    Reviewed: Allergy & Precautions, Patient's Chart, lab work & pertinent test results  Airway        Dental   Pulmonary asthma           Cardiovascular      Neuro/Psych    GI/Hepatic negative GI ROS, Neg liver ROS,,,  Endo/Other  negative endocrine ROS    Renal/GU negative Renal ROS     Musculoskeletal   Abdominal  (+) + obese (BMI  38.1)  Peds  Hematology Lab Results      Component                Value               Date                      WBC                      10.6 (H)            06/22/2022                HGB                      12.9                06/22/2022                HCT                      39.1                06/22/2022                MCV                      78.7 (L)            06/22/2022                PLT                      205                 06/22/2022              Anesthesia Other Findings   Reproductive/Obstetrics (+) Pregnancy                             Anesthesia Physical Anesthesia Plan  ASA: 3  Anesthesia Plan: Epidural   Post-op Pain Management:    Induction:   PONV Risk Score and Plan:   Airway Management Planned:   Additional Equipment:   Intra-op Plan:   Post-operative Plan:   Informed Consent:   Plan Discussed with:   Anesthesia Plan Comments: (39.3 wk G2P1 w BMI 38.1 for LEA )        Anesthesia Quick Evaluation

## 2022-06-23 NOTE — Discharge Summary (Signed)
Postpartum Discharge Summary  Date of Service updated***     Patient Name: Nichole [name] DOB: [birth year] MRN: [id number]  Date of admission: 06/22/2022 Delivery date:06/23/2022  Delivering provider: Cam Hai D  Date of discharge: 06/23/2022  Admitting diagnosis: Labor and delivery, indication for care [O75.9] Intrauterine pregnancy: [redacted]w[redacted]d     Secondary diagnosis:  Principal Problem:   Labor and delivery, indication for care Active Problems:   Cystic fibrosis carrier   History of postpartum hemorrhage  Additional problems: none    Discharge diagnosis: Term Pregnancy Delivered                                              Post partum procedures:   Augmentation:  none Complications: Hemorrhage>1030mL (TXA, cytotec)  Hospital course: Onset of Labor With Vaginal Delivery      [name] at [redacted]w[redacted]d was admitted in Latent Labor on 06/22/2022. Labor course was complicated by immediate PPH of 1329cc as placenta was delivering; also contributing was a labial lac. Membrane Rupture Time/Date: 3:31 AM ,06/23/2022   Delivery Method:Vaginal, Spontaneous  Episiotomy: None  Lacerations:    Patient had a postpartum course complicated by PPD#0 Hgb of *** (12.9 on admission).  She is ambulating, tolerating a regular diet, passing flatus, and urinating well. Patient is discharged home in stable condition on 06/23/22.  Newborn Data: Birth date:06/23/2022  Birth time:3:31 AM  Gender:Female  Living status:Living  Apgars:9 ,9  Weight:3090 g (6lb 13oz)  Magnesium Sulfate received: No BMZ received: No Rhophylac:N/A MMR:N/A T-DaP:Given prenatally Flu: No Transfusion:{Transfusion received:30440034}  Physical exam  Vitals:   06/23/22 0445 06/23/22 0500 06/23/22 0600 06/23/22 0655  BP: 100/74  100/70 98/65  Pulse: (!) 123  (!) 109 (!) 114  Resp:  20 18 20   Temp:  99.3 F (37.4 C) 99.1 F (37.3 C) 99 F (37.2 C)  TempSrc:  Oral Oral Oral  SpO2: 100%  100% 99%  Weight:       Height:       General: {Exam; general:21111117} Lochia: {Desc; appropriate/inappropriate:30686::"appropriate"} Uterine Fundus: {Desc; firm/soft:30687} Incision: {Exam; incision:21111123} DVT Evaluation: {Exam; FGH:8299371} Labs: Lab Results  Component Value Date   WBC 10.6 (H) 06/22/2022   HGB 12.9 06/22/2022   HCT 39.1 06/22/2022   MCV 78.7 (L) 06/22/2022   PLT 205 06/22/2022      Latest Ref Rng & Units 04/03/2021    4:54 PM  CMP  Glucose 70 - 99 mg/dL 99   BUN 6 - 20 mg/dL 11   Creatinine 6.96 - 1.00 mg/dL 7.89   Sodium 381 - 017 mmol/L 138   Potassium 3.5 - 5.2 mmol/L 4.3   Chloride 96 - 106 mmol/L 103   CO2 20 - 29 mmol/L 22   Calcium 8.7 - 10.2 mg/dL 9.1   Total Protein 6.0 - 8.5 g/dL 6.7   Total Bilirubin 0.0 - 1.2 mg/dL 0.2   Alkaline Phos 42 - 106 IU/L 79   AST 0 - 40 IU/L 17   ALT 0 - 32 IU/L 20    Edinburgh Score:    06/07/2020    4:17 PM  Edinburgh Postnatal Depression Scale Screening Tool  I have been able to laugh and see the funny side of things. 0  I have looked forward with enjoyment to things. 0  I have blamed myself unnecessarily when things  went wrong. 0  I have been anxious or worried for no good reason. 0  I have felt scared or panicky for no good reason. 0  Things have been getting on top of me. 0  I have been so unhappy that I have had difficulty sleeping. 0  I have felt sad or miserable. 0  I have been so unhappy that I have been crying. 0  The thought of harming myself has occurred to me. 0  Edinburgh Postnatal Depression Scale Total 0     After visit meds:  Allergies as of 06/23/2022   No Known Allergies   Med Rec must be completed prior to using this [facility]***        Discharge home in stable condition Infant Feeding: {Baby feeding:23562} Infant Disposition:{CHL IP OB HOME WITH DXIPJA:25053} Discharge instruction: per After Visit Summary and Postpartum booklet. Activity: Advance as tolerated. Pelvic rest for 6 weeks.   Diet: routine diet Future Appointments: Future Appointments  Date Time Provider Department Center  06/26/2022  4:10 PM Cheral Marker, CNM [facility]   Follow up Visit:  Arabella Merles, CNM  Myrle Sheng R Please schedule this patient for Postpartum visit in: 6 weeks with the following provider: Any provider In-Person For C/S patients schedule nurse incision check in weeks 2 weeks: no Low risk pregnancy complicated by: none Delivery mode:  SVD Anticipated Birth Control:  Condoms PP Procedures needed: none Schedule Integrated BH visit: no   06/23/2022 Arabella Merles, CNM

## 2022-06-24 MED ORDER — ACETAMINOPHEN 325 MG PO TABS: 650.0000 mg | ORAL_TABLET | ORAL | 0 refills | Status: DC | PRN

## 2022-06-24 MED ORDER — IBUPROFEN 600 MG PO TABS: 600.0000 mg | ORAL_TABLET | Freq: Four times a day (QID) | ORAL | 0 refills | Status: DC

## 2022-06-24 NOTE — Progress Notes (Signed)
Post Partum Day 1  Subjective: Nichole [name] is doing well this morning. She has no complaints. She is up ad lib, voiding, tolerating PO, and + flatus. She desires discharge home today but will not know if baby will go home until at least 1530 this afternoon.  Objective: Blood pressure (!) 102/54, pulse 80, temperature 97.8 F (36.6 C), temperature source Oral, resp. rate 18, height 5\' 4"  (1.626 m), weight 100.6 kg, SpO2 100 %, unknown if currently breastfeeding.  Physical Exam:  General: alert and no distress Lochia: appropriate Uterine Fundus: firm Incision: n/a DVT Evaluation: No evidence of DVT seen on physical exam.  Recent Labs    06/22/22 2347 06/23/22 1202  HGB 12.9 9.5*  HCT 39.1 28.3*    Assessment/Plan: PPD#1 PPH received IV Fereheme yesterday. Doing well, asymptomatic Breast/bottle feeding Declines circumcision Condoms for contraception Inadequately treated for GBS. Peds to check on baby later this afternoon. Plan for discharge later today if baby is able, otherwise plan for discharge home tomorrow   LOS: 1 day   Brand Males, CNM 06/24/2022, 11:21 AM

## 2022-06-25 ENCOUNTER — Telehealth: Payer: Self-pay | Admitting: Family Medicine

## 2022-06-26 ENCOUNTER — Encounter: Payer: Medicaid Other | Admitting: Women's Health

## 2022-06-28 NOTE — Telephone Encounter (Signed)
Chart review.

## 2022-07-03 ENCOUNTER — Telehealth (HOSPITAL_COMMUNITY): Payer: Self-pay | Admitting: *Deleted

## 2022-07-03 NOTE — Telephone Encounter (Signed)
Hospital Discharge Follow-Up Call:  Patient reports that she is well.  She asked about her perineal stitches and was concerned that she cannot see them.  Assured her that this is normal and that the stitches will dissolve on their own.  She denies perineal discomfort.  EPDS was 2 and she endorses this accurately reflects that she is doing well emotionally.  Patient says that baby is well.  She is waiting to hear from pediatrician regarding baby's jaundice level.  She anticipate that is will be lower since baby is eating and stooling more.  Reviewed ABCs of Safe Sleep.

## 2022-08-02 ENCOUNTER — Ambulatory Visit: Payer: Medicaid Other | Admitting: Advanced Practice Midwife

## 2022-08-06 ENCOUNTER — Encounter: Payer: Self-pay | Admitting: Women's Health

## 2022-08-06 ENCOUNTER — Ambulatory Visit (INDEPENDENT_AMBULATORY_CARE_PROVIDER_SITE_OTHER): Payer: Medicaid Other | Admitting: Women's Health

## 2022-08-06 DIAGNOSIS — Z8759 Personal history of other complications of pregnancy, childbirth and the puerperium: Secondary | ICD-10-CM | POA: Diagnosis not present

## 2022-08-06 DIAGNOSIS — Z3009 Encounter for other general counseling and advice on contraception: Secondary | ICD-10-CM

## 2022-08-06 NOTE — Patient Instructions (Signed)
Tips To Increase Milk Supply Lots of water! Enough so that your urine is clear Plenty of calories, if you're not getting enough calories, your milk supply can decrease Breastfeed/pump often, every 2-3 hours x 20-30mins Fenugreek 3 pills 3 times a day, this may make your urine smell like maple syrup Mother's Milk Tea Lactation cookies, google for the recipe Real oatmeal Body Armor sports drinks Liquid Gold Greater Than hydration drink  

## 2022-08-06 NOTE — Progress Notes (Signed)
POSTPARTUM VISIT Patient name: Nichole [name] MRN [id number]  Date of birth: 07/23/2000 Chief Complaint:   Postpartum Care  History of Present Illness:   Nichole [name] is a 22 y.o. G29P2002 Hispanic female being seen today for a postpartum visit. She is 6 weeks postpartum following a spontaneous vaginal delivery at 39.4 gestational weeks. IOL: no, for n/a. Anesthesia:  IV Fentanyl .  Laceration: labial.  Complications: PPH , TXA/cytotec. Inpatient contraception: no.   Pregnancy uncomplicated. Tobacco use: no. Substance use disorder: no. Last pap smear: 12/19/21 and results were NILM w/ HRHPV not done. Next pap smear due: 2026 Patient's last menstrual period was 07/30/2022.  Postpartum course has been uncomplicated. Bleeding none. Bowel function is normal. Bladder function is normal. Urinary incontinence? no, fecal incontinence? no Patient is not sexually active. Last sexual activity: prior to birth of baby. Desired contraception: condoms . Patient does want a pregnancy in the future.  Desired family size is 4 children.   Upstream - 08/06/22 1443       Pregnancy Intention Screening   Does the patient want to become pregnant in the next year? Unsure    Does the patient's partner want to become pregnant in the next year? Unsure    Would the patient like to discuss contraceptive options today? No      Contraception Wrap Up   Current Method Female Condom    End Method Female Condom    Contraception Counseling Provided No            The pregnancy intention screening data noted above was reviewed. Potential methods of contraception were discussed. The patient elected to proceed with Female Condom.  Edinburgh Postpartum Depression Screening: negative  Edinburgh Postnatal Depression Scale - 08/06/22 1446       Edinburgh Postnatal Depression Scale:  In the Past 7 Days   I have been able to laugh and see the funny side of things. 0    I have looked forward with enjoyment to  things. 0    I have blamed myself unnecessarily when things went wrong. 1    I have been anxious or worried for no good reason. 0    I have felt scared or panicky for no good reason. 0    Things have been getting on top of me. 0    I have been so unhappy that I have had difficulty sleeping. 0    I have felt sad or miserable. 0    I have been so unhappy that I have been crying. 1    The thought of harming myself has occurred to me. 0    Edinburgh Postnatal Depression Scale Total 2                03/28/2022    9:14 AM 12/05/2021   11:42 AM 10/24/2021   10:19 AM 09/15/2021   11:17 AM  GAD 7 : Generalized Anxiety Score  Nervous, Anxious, on Edge 0 0 0 0  Control/stop worrying 0 0 0 0  Worry too much - different things 0 0 0 0  Trouble relaxing 0 0 0 0  Restless 0 0 0 0  Easily annoyed or irritable 0 0 0 0  Afraid - awful might happen 0 0 0 0  Total GAD 7 Score 0 0 0 0  Anxiety Difficulty   Not difficult at all Not difficult at all     Baby's course has been uncomplicated. Baby is feeding by breast and bottle: milk supply  inadequate . Infant has a pediatrician/family doctor? Yes.  Childcare strategy if returning to work/school: n/a-stay at home mom.  Pt has material needs met for her and baby: Yes.   Review of Systems:   Pertinent items are noted in HPI Denies Abnormal vaginal discharge w/ itching/odor/irritation, headaches, visual changes, shortness of breath, chest pain, abdominal pain, severe nausea/vomiting, or problems with urination or bowel movements. Pertinent History Reviewed:  Reviewed past medical,surgical, obstetrical and family history.  Reviewed problem list, medications and allergies. OB History  Gravida Para Term Preterm AB Living  2 2 2     2   SAB IAB Ectopic Multiple Live Births        0 2    # Outcome Date GA Lbr Len/2nd Weight Sex Delivery Anes PTL Lv  2 Term 06/23/22 [redacted]w[redacted]d  6 lb 13 oz (3.09 kg) M Vag-Spont Local  LIV     Birth Comments: WDL  1 Term  04/26/20 [redacted]w[redacted]d 01:24 / 00:18 6 lb 8.2 oz (2.954 kg) M Vag-Spont None N LIV     Birth Comments: WNL   Physical Assessment:   Vitals:   08/06/22 1432  BP: 108/66  Pulse: 83  Weight: 213 lb 6.4 oz (96.8 kg)  Height: 5\' 4"  (1.626 m)  Body mass index is 36.63 kg/m.       Physical Examination:   General appearance: alert, well appearing, and in no distress  Mental status: alert, oriented to person, place, and time  Skin: warm & dry   Cardiovascular: normal heart rate noted   Respiratory: normal respiratory effort, no distress   Breasts: deferred, no complaints   Abdomen: soft, non-tender   Pelvic: lac well healed. Thin prep pap obtained: No  Rectal: not examined  Extremities: Edema: none   Chaperone: Peggy Dones         No results found for this or any previous visit (from the past 24 hour(s)).  Assessment & Plan:  1) Postpartum exam 2) 6 wks s/p spontaneous vaginal delivery 3) breast & bottle feeding> milk tips given 4) Depression screening 5) Contraception counseling> plans condoms  Essential components of care per ACOG recommendations:  1.  Mood and well being:  If positive depression screen, discussed and plan developed.  If using tobacco we discussed reduction/cessation and risk of relapse If current substance abuse, we discussed and referral to local resources was offered.   2. Infant care and feeding:  If breastfeeding, discussed returning to work, pumping, breastfeeding-associated pain, guidance regarding return to fertility while lactating if not using another method. If needed, patient was provided with a letter to be allowed to pump q 2-3hrs to support lactation in a private location with access to a refrigerator to store breastmilk.   Recommended that all caregivers be immunized for flu, pertussis and other preventable communicable diseases If pt does not have material needs met for her/baby, referred to local resources for help obtaining these.  3. Sexuality,  contraception and birth spacing Provided guidance regarding sexuality, management of dyspareunia, and resumption of intercourse Discussed avoiding interpregnancy interval <34mths and recommended birth spacing of 18 months  4. Sleep and fatigue Discussed coping options for fatigue and sleep disruption Encouraged family/partner/community support of 4 hrs of uninterrupted sleep to help with mood and fatigue  5. Physical recovery  If pt had a C/S, assessed incisional pain and providing guidance on normal vs prolonged recovery If pt had a laceration, perineal healing and pain reviewed.  If urinary or fecal incontinence, discussed management  and referred to PT or uro/gyn if indicated  Patient is safe to resume physical activity. Discussed attainment of healthy weight.  6.  Chronic disease management Discussed pregnancy complications if any, and their implications for future childbearing and long-term maternal health. Review recommendations for prevention of recurrent pregnancy complications, such as 17 hydroxyprogesterone caproate to reduce risk for recurrent PTB not applicable, or aspirin to reduce risk of preeclampsia not applicable. Pt had GDM: no. If yes, 2hr GTT scheduled: not applicable. Reviewed medications and non-pregnant dosing including consideration of whether pt is breastfeeding using a reliable resource such as LactMed: not applicable Referred for f/u w/ PCP or subspecialist providers as indicated: not applicable  7. Health maintenance Mammogram at 22yo or earlier if indicated Pap smears as indicated  Meds: No orders of the defined types were placed in this encounter.   Follow-up: Return in about 1 year (around 08/06/2023) for Physical.   No orders of the defined types were placed in this encounter.   Cheral Marker CNM, [facility] 08/06/2022 3:11 PM

## 2022-10-16 ENCOUNTER — Ambulatory Visit: Payer: Medicaid Other | Admitting: Family

## 2022-10-16 ENCOUNTER — Encounter: Payer: Self-pay | Admitting: Family

## 2022-10-16 VITALS — BP 113/73 | HR 88 | Temp 98.3°F | Ht 64.0 in | Wt 228.2 lb

## 2022-10-16 DIAGNOSIS — B360 Pityriasis versicolor: Secondary | ICD-10-CM | POA: Diagnosis not present

## 2022-10-16 DIAGNOSIS — L83 Acanthosis nigricans: Secondary | ICD-10-CM | POA: Diagnosis not present

## 2022-10-16 DIAGNOSIS — L739 Follicular disorder, unspecified: Secondary | ICD-10-CM

## 2022-10-16 MED ORDER — CEPHALEXIN 500 MG PO CAPS
500.0000 mg | ORAL_CAPSULE | Freq: Three times a day (TID) | ORAL | 0 refills | Status: DC
Start: 2022-10-16 — End: 2022-11-16

## 2022-10-16 NOTE — Patient Instructions (Addendum)
Tinea Versicolor  Tinea versicolor is a common fungal infection. It causes a rash that looks like light or dark patches on the skin. The rash most often occurs on the chest, back, neck, or upper arms. This condition is more common during warm weather. Tinea versicolor usually does not cause any other problems than the rash. In most cases, the infection goes away in a few weeks with treatment. It may take a few months for the patches on your skin to return to your usual skin color. What are the causes? This condition occurs when a certain type of fungus (Malassezia furfur) that is normally present on the skin starts to grow too much. This fungus is a type of yeast. This condition cannot be passed from one person to another (is not contagious). What increases the risk? This condition is more likely to develop when certain factors are present, such as: Heat and humidity. Sweating too much. Hormone changes, such as those that occur when taking birth control pills. Oily skin. A weak disease-fighting system (immunesystem). What are the signs or symptoms? Symptoms of this condition include: A rash of light or dark patches on your skin. The rash may have: Patches of tan or pink spots (on light skin). Patches of white or brown spots (on dark skin). Patches of skin that do not tan. Well-marked edges. Scales on the discolored areas. Mild itching. There may also be no itching. How is this diagnosed? A health care provider can usually diagnose this condition by looking at your skin. During the exam, he or she may use ultraviolet (UV) light to see how much of your skin has been affected. In some cases, a skin sample may be taken by scraping the rash. This sample will be viewed under a microscope to check for yeast overgrowth. How is this treated? Treatment for this condition may include: Dandruff shampoo that is applied to the affected skin during showers or bathing. Over-the-counter medicated skin  cream, lotion, or soaps. Prescription antifungal medicine in the form of skin cream or pills. Medicine to help reduce itching. Follow these instructions at home: Use over-the-counter and prescription medicines only as told by your health care provider. Apply dandruff shampoo to the affected area as told by your health care provider. Do not scratch the affected area of skin. Avoid hot and humid conditions. Do not use tanning booths. Try to avoid sweating a lot. Contact a health care provider if: Your symptoms get worse. You have a fever. You have signs of infection such as: Redness, swelling, or pain at the site of your rash. Warmth coming from your rash. Fluid or blood coming from your rash. Pus or a bad smell coming from your rash. Your rash comes back (recurs) after treatment. Your rash does not improve with treatment and spreads to other parts of the body. Summary Tinea versicolor is a common fungal infection of the skin. It causes a rash that looks like light or dark patches on the skin. The rash most often occurs on the chest, back, neck, or upper arms. A health care provider can usually diagnose this condition by looking at your skin. Treatment may include applying shampoo to the skin and taking or applying medicines. This information is not intended to replace advice given to you by your health care provider. Make sure you discuss any questions you have with your health care provider.   Acanthosis Nigricans  Acanthosis nigricans is a condition in which dark, velvety markings appear on the skin. What  are the causes? This condition may be caused by: A disorder that affects your hormones or glands. This includes diabetes. Obesity. Certain medicines, such as birth control pills. A tumor. This is rare. This condition may run in families. What increases the risk? You are more likely to develop this condition if: You have a disorder that affects your hormones or glands. You  are overweight. You take certain medicines. You have certain cancers, such as stomach cancer. You have dark-colored skin (dark complexion). What are the signs or symptoms? The main symptom of this condition is velvety markings on the skin that are light brown, black, or gray in color. The markings often appear on the face. They may also show up in skinfolds, such as on the neck, armpits, inner thighs, and groin. In severe cases, markings may also appear on the lips, hands, breasts, eyelids, and mouth. How is this diagnosed? This condition may be diagnosed based on: A physical exam. Your health care provider will look at your skin. A skin sample may be taken for testing (skin biopsy). You may also have tests to find the cause of the condition. How is this treated? Treatment depends on the cause. It may involve lowering insulin levels. These levels are often high in people who have this condition. Insulin levels can be reduced by: Making changes to your diet. You may need to avoid starchy foods and sugars. Losing weight. Taking medicines. Treatment may also include: Medicines to help your skin look better. Laser treatment to help your skin look better. Surgery to remove the skin markings (dermabrasion). Follow these instructions at home: Take over-the-counter and prescription medicines only as told by your health care provider. Follow instructions from your health care provider about what you may eat and drink. If you are overweight, work with your health care provider and a dietitian to set a weight-loss goal that is healthy and reasonable for you. Keep all follow-up visits. Work with your health care provider to manage the cause of your condition. Contact a health care provider if: New markings form on a part of the body where they do not often develop. This includes your lips, hands, breasts, eyelids, or mouth. The condition comes back, and you are not sure why. This information is not  intended to replace advice given to you by your health care provider. Make sure you discuss any questions you have with your health care provider. Document Revised: 08/15/2021 Document Reviewed: 08/15/2021 Elsevier Patient Education  2024 Elsevier Inc.  Document Revised: 05/24/2020 Document Reviewed: 05/24/2020 Elsevier Patient Education  2024 [employer].

## 2022-10-16 NOTE — Progress Notes (Signed)
   Subjective:    Patient ID: Nichole [name], female    DOB: Aug 09, 2000, 22 y.o.   MRN: [id number]  Chief Complaint  Patient presents with   Rash    Rash   Pt presents to the office today with a rash on bilateral thighs for the last two years. Reports she noticed the rash on her thighs after walking or laying down.   She reports acne on her buttocks down her thigh and discoloration of her skin.    Review of Systems  Skin:  Positive for rash.  All other systems reviewed and are negative.      Objective:   Physical Exam Vitals reviewed.  Constitutional:      General: She is not in acute distress.    Appearance: She is well-developed.  HENT:     Head: Normocephalic and atraumatic.  Eyes:     Pupils: Pupils are equal, round, and reactive to light.  Neck:     Thyroid: No thyromegaly.  Cardiovascular:     Rate and Rhythm: Normal rate and regular rhythm.     Heart sounds: Normal heart sounds. No murmur heard. Pulmonary:     Effort: Pulmonary effort is normal. No respiratory distress.     Breath sounds: Normal breath sounds. No wheezing.  Abdominal:     General: Bowel sounds are normal. There is no distension.     Palpations: Abdomen is soft.     Tenderness: There is no abdominal tenderness.  Musculoskeletal:        General: No tenderness. Normal range of motion.     Cervical back: Normal range of motion and neck supple.  Skin:    General: Skin is warm and dry.          Comments: Acne on buttocks, light discoloration on bilateral medial thighs, dark discoloration in groin and inner thighs,    Neurological:     Mental Status: She is alert and oriented to person, place, and time.     Cranial Nerves: No cranial nerve deficit.     Deep Tendon Reflexes: Reflexes are normal and symmetric.  Psychiatric:        Behavior: Behavior normal.        Thought Content: Thought content normal.        Judgment: Judgment normal.     BP 113/73   Pulse 88   Temp 98.3 F (36.8  C) (Temporal)   Ht 5\' 4"  (1.626 m)   Wt 228 lb 3.2 oz (103.5 kg)   SpO2 96%   BMI 39.17 kg/m        Assessment & Plan:   Nichole [name] comes in today with chief complaint of Rash   Diagnosis and orders addressed:  1. Tinea versicolor Use Head and Shoulders as body wash  Could take a few months for color to become normal  - Ambulatory referral to Dermatology  2. Folliculitis Keep clean and dry Avoid wearing tight leggings  - cephALEXin (KEFLEX) 500 MG capsule; Take 1 capsule (500 mg total) by mouth 3 (three) times daily.  Dispense: 21 capsule; Refill: 0 - Ambulatory referral to Dermatology  3. Acanthosis nigricans Labs pending  Low carb diet  - Ambulatory referral to Dermatology - BMP8+EGFR   Labs pending Health Maintenance reviewed Diet and exercise encouraged  Follow up plan: Keep follow up with PCP  Jannifer Rodney, FNP

## 2022-11-16 ENCOUNTER — Encounter: Payer: Self-pay | Admitting: Family Medicine

## 2022-11-16 ENCOUNTER — Ambulatory Visit: Payer: Medicaid Other | Admitting: Family Medicine

## 2022-11-16 VITALS — BP 113/77 | HR 97 | Temp 98.1°F | Resp 20 | Ht 64.0 in | Wt 225.0 lb

## 2022-11-16 DIAGNOSIS — N3000 Acute cystitis without hematuria: Secondary | ICD-10-CM

## 2022-11-16 DIAGNOSIS — R3989 Other symptoms and signs involving the genitourinary system: Secondary | ICD-10-CM | POA: Diagnosis not present

## 2022-11-16 LAB — URINALYSIS, ROUTINE W REFLEX MICROSCOPIC
Bilirubin, UA: NEGATIVE
Glucose, UA: NEGATIVE
Ketones, UA: NEGATIVE
Nitrite, UA: NEGATIVE
Specific Gravity, UA: 1.025 (ref 1.005–1.030)
Urobilinogen, Ur: 0.2 mg/dL (ref 0.2–1.0)
pH, UA: 6 (ref 5.0–7.5)

## 2022-11-16 LAB — MICROSCOPIC EXAMINATION
RBC, Urine: NONE SEEN /hpf (ref 0–2)
Renal Epithel, UA: NONE SEEN /hpf
WBC, UA: >30 /hpf — AB (ref 0–5)
Yeast, UA: NONE SEEN

## 2022-11-16 MED ORDER — CEPHALEXIN 500 MG PO CAPS: 500.0000 mg | ORAL_CAPSULE | Freq: Four times a day (QID) | ORAL | 0 refills | Status: AC

## 2022-11-16 MED ORDER — FLUCONAZOLE 150 MG PO TABS
150.0000 mg | ORAL_TABLET | Freq: Once | ORAL | 0 refills | Status: AC
Start: 2022-11-16 — End: 2022-11-16

## 2022-11-16 NOTE — Patient Instructions (Signed)
Urinary Tract Infection, Adult A urinary tract infection (UTI) is an infection of any part of the urinary tract. The urinary tract includes: The kidneys. The ureters. The bladder. The urethra. These organs make, store, and get rid of pee (urine) in the body. What are the causes? This infection is caused by germs (bacteria) in your genital area. These germs grow and cause swelling (inflammation) of your urinary tract. What increases the risk? The following factors may make you more likely to develop this condition: Using a small, thin tube (catheter) to drain pee. Not being able to control when you pee or poop (incontinence). Being female. If you are female, these things can increase the risk: Using these methods to prevent pregnancy: A medicine that kills sperm (spermicide). A device that blocks sperm (diaphragm). Having low levels of a female hormone (estrogen). Being pregnant. You are more likely to develop this condition if: You have genes that add to your risk. You are sexually active. You take antibiotic medicines. You have trouble peeing because of: A prostate that is bigger than normal, if you are female. A blockage in the part of your body that drains pee from the bladder. A kidney stone. A nerve condition that affects your bladder. Not getting enough to drink. Not peeing often enough. You have other conditions, such as: Diabetes. A weak disease-fighting system (immune system). Sickle cell disease. Gout. Injury of the spine. What are the signs or symptoms? Symptoms of this condition include: Needing to pee right away. Peeing small amounts often. Pain or burning when peeing. Blood in the pee. Pee that smells bad or not like normal. Trouble peeing. Pee that is cloudy. Fluid coming from the vagina, if you are female. Pain in the belly or lower back. Other symptoms include: Vomiting. Not feeling hungry. Feeling mixed up (confused). This may be the first symptom in  older adults. Being tired and grouchy (irritable). A fever. Watery poop (diarrhea). How is this treated? Taking antibiotic medicine. Taking other medicines. Drinking enough water. In some cases, you may need to see a specialist. Follow these instructions at home:  Medicines Take over-the-counter and prescription medicines only as told by your doctor. If you were prescribed an antibiotic medicine, take it as told by your doctor. Do not stop taking it even if you start to feel better. General instructions Make sure you: Pee until your bladder is empty. Do not hold pee for a long time. Empty your bladder after sex. Wipe from front to back after peeing or pooping if you are a female. Use each tissue one time when you wipe. Drink enough fluid to keep your pee pale yellow. Keep all follow-up visits. Contact a doctor if: You do not get better after 1-2 days. Your symptoms go away and then come back. Get help right away if: You have very bad back pain. You have very bad pain in your lower belly. You have a fever. You have chills. You feeling like you will vomit or you vomit. Summary A urinary tract infection (UTI) is an infection of any part of the urinary tract. This condition is caused by germs in your genital area. There are many risk factors for a UTI. Treatment includes antibiotic medicines. Drink enough fluid to keep your pee pale yellow. This information is not intended to replace advice given to you by your health care provider. Make sure you discuss any questions you have with your health care provider. Document Revised: 10/11/2019 Document Reviewed: 10/16/2019 Elsevier Patient Education    2024 Elsevier Inc.  

## 2022-11-16 NOTE — Progress Notes (Signed)
Subjective: CC:UTI PCP: Dettinger, Elige Radon, MD [id number] Nichole [name] is a 22 y.o. female presenting to clinic today for:  1. Urinary symptoms Patient reports a 3 day h/o dysuria .  Reports urinary frequency, urgency, scant hematuria, nausea, mild low back pain and scant yellow vaginal discharge. No fevers, chills, abdominal pain, vomiting.  Patient has used AZO for symptoms.  Patient denies a h/o frequent or recurrent UTIs.  Patient's last menstrual period was 10/22/2022.  ROS: Per HPI  Allergies  Allergen Reactions   Other Other (See Comments)    Patient has noted that one of her arms has become red/irritated/"bumpy" from an unknown/unnamed allergen to which she feels she's been exposed while in the hospital.   Past Medical History:  Diagnosis Date   Asthma     Current Outpatient Medications:    albuterol (VENTOLIN HFA) 108 (90 Base) MCG/ACT inhaler, Inhale 2 puffs into the lungs every 6 (six) hours as needed. (Patient taking differently: Inhale 2 puffs into the lungs every 6 (six) hours as needed for wheezing or shortness of breath.), Disp: 18 g, Rfl: 2   cephALEXin (KEFLEX) 500 MG capsule, Take 1 capsule (500 mg total) by mouth 3 (three) times daily., Disp: 21 capsule, Rfl: 0 Social History   Socioeconomic History   Marital status: Single    Spouse name: Not on file   Number of children: Not on file   Years of education: Not on file   Highest education level: Not on file  Occupational History   Not on file  Tobacco Use   Smoking status: Never   Smokeless tobacco: Never  Vaping Use   Vaping status: Never Used  Substance and Sexual Activity   Alcohol use: No   Drug use: No   Sexual activity: Not Currently    Birth control/protection: None  Other Topics Concern   Not on file  Social History Narrative   Not on file   Social Determinants of Health   Financial Resource Strain: Low Risk  (12/19/2021)   Overall Financial Resource Strain (CARDIA)    Difficulty of  Paying Living Expenses: Not hard at all  Food Insecurity: No Food Insecurity (06/23/2022)   Hunger Vital Sign    Worried About Running Out of Food in the Last Year: Never true    Ran Out of Food in the Last Year: Never true  Transportation Needs: No Transportation Needs (06/23/2022)   PRAPARE - Administrator, Civil Service (Medical): No    Lack of Transportation (Non-Medical): No  Physical Activity: Insufficiently Active (12/19/2021)   Exercise Vital Sign    Days of Exercise per Week: 2 days    Minutes of Exercise per Session: 30 min  Stress: No Stress Concern Present (12/19/2021)   [employer] of Occupational Health - Occupational Stress Questionnaire    Feeling of Stress : Not at all  Social Connections: Moderately Integrated (12/19/2021)   Social Connection and Isolation Panel [NHANES]    Frequency of Communication with Friends and Family: Twice a week    Frequency of Social Gatherings with Friends and Family: Once a week    Attends Religious Services: 1 to 4 times per year    Active Member of [employer] or Organizations: No    Attends Banker Meetings: Never    Marital Status: Married  Catering manager Violence: Not At Risk (06/23/2022)   Humiliation, Afraid, Rape, and Kick questionnaire    Fear of Current or Ex-Partner: No    Emotionally  Abused: No    Physically Abused: No    Sexually Abused: No   Family History  Problem Relation Age of Onset   Diabetes Father     Objective: Office vital signs reviewed. BP 113/77   Pulse 97   Temp 98.1 F (36.7 C) (Temporal)   Resp 20   Ht 5\' 4"  (1.626 m)   Wt 225 lb (102.1 kg)   LMP 10/22/2022   SpO2 96%   BMI 38.62 kg/m   Physical Examination:  General: Awake, alert, well nourished, No acute distress HEENT: sclera white, MMM Cardio: regular rate and rhythm, S1S2 heard, no murmurs appreciated GU: no CVA TTP  Assessment/ Plan: 22 y.o. female   Acute cystitis without hematuria - Plan: Urinalysis, Routine  w reflex microscopic, Urine Culture, cephALEXin (KEFLEX) 500 MG capsule, fluconazole (DIFLUCAN) 150 MG tablet  Keflex QID for UTI, possibly complicated given reports of low back pain, hematuria and nausea.  Diflucan sent to cover for yeast infection.  Follow up prn   Raliegh Ip, DO [facility] [phone]

## 2022-11-18 LAB — URINE CULTURE

## 2022-12-27 ENCOUNTER — Encounter: Payer: Self-pay | Admitting: Family

## 2022-12-27 ENCOUNTER — Ambulatory Visit: Payer: Medicaid Other | Admitting: Family

## 2022-12-27 VITALS — BP 109/72 | HR 86 | Temp 98.1°F | Ht 64.0 in | Wt 225.2 lb

## 2022-12-27 DIAGNOSIS — R059 Cough, unspecified: Secondary | ICD-10-CM | POA: Diagnosis not present

## 2022-12-27 DIAGNOSIS — J069 Acute upper respiratory infection, unspecified: Secondary | ICD-10-CM | POA: Diagnosis not present

## 2022-12-27 MED ORDER — CETIRIZINE HCL 10 MG PO TABS
10.0000 mg | ORAL_TABLET | Freq: Every day | ORAL | 1 refills | Status: DC
Start: 2022-12-27 — End: 2023-01-14

## 2022-12-27 MED ORDER — FLUTICASONE PROPIONATE 50 MCG/ACT NA SUSP
2.0000 | Freq: Every day | NASAL | 6 refills | Status: DC
Start: 2022-12-27 — End: 2023-01-14

## 2022-12-27 MED ORDER — ONDANSETRON HCL 4 MG PO TABS: 4.0000 mg | ORAL_TABLET | Freq: Three times a day (TID) | ORAL | 0 refills | Status: DC | PRN

## 2022-12-27 MED ORDER — PROMETHAZINE-DM 6.25-15 MG/5ML PO SYRP
5.0000 mL | ORAL_SOLUTION | Freq: Three times a day (TID) | ORAL | 0 refills | Status: DC | PRN
Start: 2022-12-27 — End: 2023-01-14

## 2022-12-27 NOTE — Progress Notes (Signed)
Subjective:    Patient ID: Nichole [name], female    DOB: 04-Jan-2001, 22 y.o.   MRN: [id number]  Chief Complaint  Patient presents with   Abdominal Pain   Diarrhea   Nausea   PT presents to the office today with abdominal pain, diarrhea, sore throat, congestion, and nausea that started Monday.  Abdominal Pain This is a new problem. Associated symptoms include diarrhea, headaches and a sore throat. Pertinent negatives include no vomiting.  Diarrhea  This is a new problem. The current episode started in the past 7 days. Associated symptoms include abdominal pain, coughing, headaches and a URI. Pertinent negatives include no vomiting.  URI  This is a new problem. The current episode started in the past 7 days. The problem has been unchanged. Associated symptoms include abdominal pain, congestion, coughing, diarrhea, headaches and a sore throat. Pertinent negatives include no vomiting.      Review of Systems  HENT:  Positive for congestion and sore throat.   Respiratory:  Positive for cough.   Gastrointestinal:  Positive for abdominal pain and diarrhea. Negative for vomiting.  Neurological:  Positive for headaches.  All other systems reviewed and are negative.      Objective:   Physical Exam Vitals reviewed.  Constitutional:      General: She is not in acute distress.    Appearance: She is well-developed. She is obese.  HENT:     Head: Normocephalic and atraumatic.     Comments: Dry intermittent cough    Right Ear: External ear normal.  Eyes:     Pupils: Pupils are equal, round, and reactive to light.  Neck:     Thyroid: No thyromegaly.  Cardiovascular:     Rate and Rhythm: Normal rate and regular rhythm.     Heart sounds: Normal heart sounds. No murmur heard. Pulmonary:     Effort: Pulmonary effort is normal. No respiratory distress.     Breath sounds: Normal breath sounds. No wheezing.  Abdominal:     General: Bowel sounds are normal. There is no distension.      Palpations: Abdomen is soft.     Tenderness: There is no abdominal tenderness.  Musculoskeletal:        General: No tenderness. Normal range of motion.     Cervical back: Normal range of motion and neck supple.  Skin:    General: Skin is warm and dry.  Neurological:     Mental Status: She is alert and oriented to person, place, and time.     Cranial Nerves: No cranial nerve deficit.     Deep Tendon Reflexes: Reflexes are normal and symmetric.  Psychiatric:        Behavior: Behavior normal.        Thought Content: Thought content normal.        Judgment: Judgment normal.    BP 109/72   Pulse 86   Temp 98.1 F (36.7 C) (Temporal)   Ht 5\' 4"  (1.626 m)   Wt 225 lb 3.2 oz (102.2 kg)   SpO2 95%   BMI 38.66 kg/m       Assessment & Plan:  [name] comes in today with chief complaint of Abdominal Pain, Diarrhea, and Nausea   Diagnosis and orders addressed:  1. Cough, unspecified type - Novel Coronavirus, NAA ([facility])  2. Viral URI with cough - Take meds as prescribed - Use a cool mist humidifier  -Use saline nose sprays frequently -Force fluids -For any cough or congestion  Use plain Mucinex- regular strength or max strength is fine -For fever or aces or pains- take tylenol or ibuprofen. -Throat lozenges if help -New toothbrush in 3 days Follow up if symptoms worsen or do not improve  - promethazine-dextromethorphan (PROMETHAZINE-DM) 6.25-15 MG/5ML syrup; Take 5 mLs by mouth 3 (three) times daily as needed for cough.  Dispense: 118 mL; Refill: 0 - fluticasone (FLONASE) 50 MCG/ACT nasal spray; Place 2 sprays into both nostrils daily.  Dispense: 16 g; Refill: 6 - cetirizine (ZYRTEC ALLERGY) 10 MG tablet; Take 1 tablet (10 mg total) by mouth daily.  Dispense: 90 tablet; Refill: 1 - ondansetron (ZOFRAN) 4 MG tablet; Take 1 tablet (4 mg total) by mouth every 8 (eight) hours as needed for nausea or vomiting.  Dispense: 20 tablet; Refill: 0      Nichole Rodney,  FNP

## 2022-12-27 NOTE — Patient Instructions (Signed)
Viral Illness, Adult Viruses are tiny germs that can get into a person's body and cause illness. There are many different types of viruses. And they cause many types of illness. Viral illnesses can range from mild to severe. They can affect various parts of the body. Short-term conditions that are caused by a virus include colds and flu (influenza) and stomach viruses. Long-term conditions that are caused by a virus include herpes, shingles, and human immunodeficiency virus (HIV) infection. A few viruses have been linked to certain cancers. What are the causes? Many types of viruses can cause illness. Viruses get into cells in your body, multiply, and cause the infected cells to work differently or die. When these cells die, they release more of the virus. When this happens, you get symptoms of the illness and the virus spreads to other cells. If the virus takes over how the cell works, it can cause the cell to divide and grow out of control. This happens when a virus causes cancer. Different viruses get into the body in different ways. You can get a virus by: Swallowing food or water that has come in contact with the virus. Breathing in droplets that have been coughed or sneezed into the air by an infected person. Touching a surface that has the virus on it and then touching your eyes, nose, or mouth. Being bitten by an insect or animal that carries the virus. Having sexual contact with a person who is infected with the virus. Being exposed to blood or fluids that contain the virus, either through an open cut or during a transfusion. If a virus enters your body, your body's disease-fighting system (immune system) will try to fight the virus. You may be at higher risk for a viral illness if your immune system is weak. What are the signs or symptoms? Symptoms depend on the type of virus and the location of the cells that it gets into. Symptoms can include: For cold and flu  viruses: Fever. Headache. Sore throat. Muscle aches. Stuffy nose (nasal congestion). Cough. For stomach (gastrointestinal) viruses: Fever. Pain in the abdomen. Nausea or vomiting. Diarrhea. For liver viruses (hepatitis): Loss of appetite. Feeling tired. Skin or the white parts of your eyes turning yellow (jaundice). For brain and spinal cord viruses: Fever. Headache. Stiff neck. Nausea and vomiting. Confusion or being sleepy. For skin viruses: Warts. Itching. Rash. For sexually transmitted viruses: Discharge. Swelling. Redness. Rash. How is this diagnosed? This condition may be diagnosed based on one or more of these: Your symptoms and medical history. A physical exam. Tests, such as: Blood tests. Tests on a sample of mucus from your lungs (sputum sample). Tests on a poop (stool) sample. Tests on a swab of body fluids or a skin sore (lesion). How is this treated? Viruses can be hard to treat because they live within cells. Antibiotics do not treat viruses because these medicines do not get inside cells. Treatment for a viral illness may include: Resting and drinking a lot of fluids. Medicines to treat symptoms. These can include over-the-counter medicine for pain and fever, medicines for cough or congestion, and medicines for diarrhea. Antiviral medicines. These medicines are available only for certain types of viruses. Some viral illnesses can be prevented with vaccinations. A common example is the flu shot. Follow these instructions at home: Medicines Take over-the-counter and prescription medicines only as told by your health care provider. If you were prescribed an antiviral medicine, take it as told by your provider. Do not stop  taking the antiviral even if you start to feel better. Know when antibiotics are needed and when they are not needed. Antibiotics do not treat viruses. You may get an antibiotic if your provider thinks that you may have, or are at risk  for, a bacterial infection and you have a viral infection. Do not ask for an antibiotic prescription if you have been diagnosed with a viral illness. Antibiotics will not make your illness go away faster. Taking antibiotics when they are not needed can lead to antibiotic resistance. When this develops, the medicine no longer works against the bacteria that it normally fights. General instructions Drink enough fluids to keep your pee (urine) pale yellow. Rest as much as possible. Return to your normal activities as told by your provider. Ask your provider what activities are safe for you. How is this prevented? To lower your risk of getting another viral illness: Wash your hands often with soap and water for at least 20 seconds. If soap and water are not available, use hand sanitizer. Avoid touching your nose, eyes, and mouth, especially if you have not washed your hands recently. If anyone in your household has a viral infection, clean all household surfaces that may have been in contact with the virus. Use soap and hot water. You may also use a commercially prepared, bleach-containing solution. Stay away from people who are sick with symptoms of a viral infection. Do not share items such as toothbrushes and water bottles with other people. Keep your vaccinations up to date. This includes getting a yearly flu shot. Eat a healthy diet and get plenty of rest. Contact a health care provider if: You have symptoms of a viral illness that do not go away. Your symptoms come back after going away. Your symptoms get worse. Get help right away if: You have trouble breathing. You have a severe headache or a stiff neck. You have severe vomiting or pain in your abdomen. These symptoms may be an emergency. Get help right away. Call 911. Do not wait to see if the symptoms will go away. Do not drive yourself to the hospital. This information is not intended to replace advice given to you by your health  care provider. Make sure you discuss any questions you have with your health care provider. Document Revised: 03/21/2022 Document Reviewed: 01/03/2022 Elsevier Patient Education  2024 [employer].

## 2022-12-28 LAB — NOVEL CORONAVIRUS, NAA: SARS-CoV-2, NAA: DETECTED — AB

## 2023-01-14 ENCOUNTER — Ambulatory Visit (INDEPENDENT_AMBULATORY_CARE_PROVIDER_SITE_OTHER): Payer: Medicaid Other

## 2023-01-14 ENCOUNTER — Encounter: Payer: Self-pay | Admitting: Family Medicine

## 2023-01-14 ENCOUNTER — Ambulatory Visit: Payer: Medicaid Other | Admitting: Family Medicine

## 2023-01-14 VITALS — BP 115/69 | HR 76 | Ht 64.0 in | Wt 226.0 lb

## 2023-01-14 DIAGNOSIS — Z3009 Encounter for other general counseling and advice on contraception: Secondary | ICD-10-CM

## 2023-01-14 DIAGNOSIS — Z3042 Encounter for surveillance of injectable contraceptive: Secondary | ICD-10-CM | POA: Diagnosis not present

## 2023-01-14 LAB — PREGNANCY, URINE: Preg Test, Ur: NEGATIVE

## 2023-01-14 MED ORDER — MEDROXYPROGESTERONE ACETATE 150 MG/ML IM SUSY
150.0000 mg | PREFILLED_SYRINGE | INTRAMUSCULAR | Status: DC
Start: 2023-01-14 — End: 2023-10-10
  Administered 2023-01-14: 150 mg via INTRAMUSCULAR

## 2023-01-14 MED ORDER — MEDROXYPROGESTERONE ACETATE 150 MG/ML IM SUSP
150.0000 mg | INTRAMUSCULAR | 3 refills | Status: DC
Start: 2023-01-14 — End: 2023-10-10

## 2023-01-14 NOTE — Progress Notes (Unsigned)
BP 115/69   Pulse 76   Ht 5\' 4"  (1.626 m)   Wt 226 lb (102.5 kg)   LMP 12/22/2022 (Approximate)   SpO2 98%   BMI 38.79 kg/m    Subjective:   Patient ID: Nichole [name], female    DOB: 2000-11-30, 22 y.o.   MRN: [id number]  HPI: Nichole [name] is a 22 y.o. female presenting on 01/14/2023 for Medical Management of Chronic Issues and Contraceptive Management  Patient is here today to discuss contraception options. She is currently 6 months post-partum and no longer breastfeeding. We discussed the pros and cons of birth control pills, vaginal rings, Depo shots, Nexplanons, copper IUDs, and progestin IUDs. Patient has previously tried both birth control pills and Nexplanon. She finds regularly taking the birth control pills difficult, and she was taking the pills when she became pregnant with her second child. The Nexplanon caused pulling and pain in her arm as well as irregular cycles. She also had some weight gain and a vasculitis-like rash around the same time of Nexplanon insertion, but she is unsure whether these are related as she has continued to experience weight gain and the rash since Nexplanon removal. She was most interested in the Depo shot as she had discussed this option with her husband and sister prior to the appointment. Patient was informed of the similar side effect profile to Nexplanon with the potential for irregular bleeding, weight gain, or worsening of her rash. Patient elected to try one Depo shot and re-evaluate options in 3 months.  Relevant past medical, surgical, family and social history reviewed and updated as indicated. Interim medical history since our last visit reviewed. Allergies and medications reviewed and updated.  Review of Systems  Constitutional:  Negative for appetite change and fatigue.  HENT:  Negative for congestion and rhinorrhea.   Eyes:  Negative for visual disturbance.  Respiratory:  Negative for chest tightness and shortness of  breath.   Cardiovascular:  Negative for chest pain and leg swelling.  Gastrointestinal:  Positive for constipation. Negative for abdominal pain, blood in stool and diarrhea.  Genitourinary:  Negative for dysuria, frequency, hematuria and urgency.  Neurological:  Negative for headaches.    Per HPI unless specifically indicated above   Allergies as of 01/14/2023       Reactions   Other Other (See Comments)   Patient has noted that one of her arms has become red/irritated/"bumpy" from an unknown/unnamed allergen to which she feels she's been exposed while in the hospital.        Medication List        Accurate as of January 14, 2023  9:11 AM. If you have any questions, ask your nurse or doctor.          STOP taking these medications    cetirizine 10 MG tablet Commonly known as: ZyrTEC Allergy Stopped by: Elige Radon Dettinger   fluticasone 50 MCG/ACT nasal spray Commonly known as: FLONASE Stopped by: Elige Radon Dettinger   ondansetron 4 MG tablet Commonly known as: Zofran Stopped by: Elige Radon Dettinger   promethazine-dextromethorphan 6.25-15 MG/5ML syrup Commonly known as: PROMETHAZINE-DM Stopped by: Elige Radon Dettinger       TAKE these medications    albuterol 108 (90 Base) MCG/ACT inhaler Commonly known as: VENTOLIN HFA Inhale 2 puffs into the lungs every 6 (six) hours as needed.   medroxyPROGESTERone 150 MG/ML injection Commonly known as: Depo-Provera Inject 1 mL (150 mg total) into the muscle every 3 (three) months. Started  by: Elige Radon Dettinger         Objective:   BP 115/69   Pulse 76   Ht 5\' 4"  (1.626 m)   Wt 226 lb (102.5 kg)   LMP 12/22/2022 (Approximate)   SpO2 98%   BMI 38.79 kg/m   Wt Readings from Last 3 Encounters:  01/14/23 226 lb (102.5 kg)  12/27/22 225 lb 3.2 oz (102.2 kg)  11/16/22 225 lb (102.1 kg)    Physical Exam Vitals and nursing note reviewed.  Constitutional:      General: She is not in acute distress.     Appearance: Normal appearance. She is obese.  HENT:     Head: Normocephalic and atraumatic.  Cardiovascular:     Rate and Rhythm: Normal rate and regular rhythm.     Heart sounds: Normal heart sounds.  Pulmonary:     Effort: Pulmonary effort is normal. No respiratory distress.     Breath sounds: Normal breath sounds.  Abdominal:     General: Abdomen is flat.     Palpations: Abdomen is soft.     Tenderness: There is abdominal tenderness.     Comments: Mild tenderness to palpation in the right lower quadrant.  Musculoskeletal:     Cervical back: Normal range of motion and neck supple.     Right lower leg: No edema.     Left lower leg: No edema.  Skin:    General: Skin is warm and dry.  Neurological:     Mental Status: She is alert and oriented to person, place, and time.  Psychiatric:        Mood and Affect: Mood normal.        Behavior: Behavior normal.     Assessment & Plan:   Problem List Items Addressed This Visit   None Visit Diagnoses     Birth control counseling    -  Primary   Relevant Medications   medroxyPROGESTERone (DEPO-PROVERA) 150 MG/ML injection   Other Relevant Orders   Pregnancy, urine (Completed)       Will complete urine pregnancy test during visit. Will send prescription for Depo shot to pharmacy. Patient is to schedule nurse visit for later this week to learn how to administer the shot. She will see how her periods, weight, and rash respond over the next 3 months and return if she would like to try a different contraception method. Patient also to monitor abdominal pain noted on exam and return if symptoms fail to improve or worsen.  Follow up plan: Return if symptoms worsen or fail to improve, for Needs nurse visit to start Depo-Provera.  Counseling provided for all of the vaccine components Orders Placed This Encounter  Procedures   Pregnancy, urine    Gillermina Phy, Medical Student [facility] 01/14/2023, 9:11  AM  Patient seen and examined with Gillermina Phy, medical student.  Agree with assessment plan above.  After much discussion patient has opted to still want to try the Depo-Provera.  We discussed risks and side effects especially with her reactions to the Nexplanon.  She is agreeable to this and wants to try it.  Urine pregnancy was negative  Arville Care, MD [facility] 01/15/2023, 8:35 AM

## 2023-01-14 NOTE — Progress Notes (Signed)
Pt received 1st Depo injection today without difficulty. Given in right upper outer quad. Next appt has been scheduled for 04/01/2023.

## 2023-03-07 ENCOUNTER — Encounter: Payer: Self-pay | Admitting: Dermatology

## 2023-03-07 ENCOUNTER — Ambulatory Visit: Payer: Medicaid Other | Admitting: Dermatology

## 2023-03-07 VITALS — BP 116/77 | HR 86

## 2023-03-07 DIAGNOSIS — R231 Pallor: Secondary | ICD-10-CM

## 2023-03-07 DIAGNOSIS — R1013 Epigastric pain: Secondary | ICD-10-CM | POA: Diagnosis not present

## 2023-03-07 NOTE — Progress Notes (Signed)
   New Patient Visit   Subjective  Nichole [name] is a 22 y.o. female who presents for the following: Skin Discoloration  Patient states she has discoloration located at the upper legs that she would like to have examined. Patient reports the areas have been there for 2 years.She states the areas got worse while on birth control. She reports the areas are bothersome. She describes the discomfort as burning. Patient rates irritation  out of 10. She states that the areas have spread. Patient reports she has previously been treated for these areas.   The following portions of the chart were reviewed this encounter and updated as appropriate: medications, allergies, medical history  Review of Systems:  No other skin or systemic complaints except as noted in HPI or Assessment and Plan.  Objective  Well appearing patient in no apparent distress; mood and affect are within normal limits.  A focused examination was performed of the following areas: Thighs  Relevant exam findings are noted in the Assessment and Plan.    Assessment & Plan    1. Livedo Reticularis - Assessment:  Patient presents with leg discoloration and a burning sensation lasting about a minute, typically when lying down. Physical examination reveals mild, blanchable erythema in a lace-like, net-like distribution. Symptoms began two years ago, correlating with pregnancy and have been influenced by hormonal changes, including the use of various birth control methods. Currently on Depo shot (progesterone-based), which has reduced symptoms compared to previous estrogen-containing contraceptives. Differential diagnoses include lupus and other autoimmune conditions.  - Plan:  -Order laboratory tests including CBC, ANA with reflex titers if positive, C-ANCA, and P-ANCA to rule out possible underlying autoimmune condition  -. Advise avoiding estrogen-containing contraceptives.   - No specific treatment required if lab results  are normal.  2. Dyspepsia - Assessment: Patient reports abdominal bloating even with small food portions and a burning sensation in the stomach, symptoms shared with spouse. Possible exacerbating factors include dairy products, carbohydrates, and certain vegetables. A history of COVID-19 infection may be contributing to persistent gastrointestinal symptoms. Differential diagnoses include lactose intolerance, food intolerances, and H. pylori infection.  - Plan:  Initiate a trial of lactose-free milk. Consider lactase enzyme supplements before consuming dairy. Trial Gas-X for bloating relief. Trial Beano for bean-related bloating. Trial omeprazole for acid reduction, which may take up to 2 weeks for effect. Start a food diary to identify trigger foods.   Follow Up with  gastroenterologist if symptoms persist,  LIVEDO RETICULARIS   Related Procedures CBC with Differential/Platelets ANA,IFA RA Diag Pnl w/rflx Tit/Patn ANCA Profile (RDL)  Return if symptoms worsen or fail to improve.    Documentation: I have reviewed the above documentation for accuracy and completeness, and I agree with the above.  Stasia Cavalier, am acting as scribe for Langston Reusing, DO.  Langston Reusing, DO

## 2023-03-07 NOTE — Patient Instructions (Addendum)
Hello [name],  Thank you for visiting my office today. Your dedication to addressing your health concerns and improving your overall well-being is greatly appreciated. Below is a summary of our discussion and the essential instructions for your care:  - Livedo Reticularis Management:   - Estrogen Triggers: It is crucial to avoid estrogen triggers as your condition is related to estrogen levels.   - Laboratory Tests Ordered: To rule out autoimmune conditions, the following tests have been ordered:     - Complete Blood Count (CBC)     - Antinuclear Antibody (ANA) with reflex titers if positive     - C-ANCA and P-ANCA   - Results: The results are expected in about three weeks. We will follow up with a MyChart Message if anything abnormal is found.  - Stomach Issues:   - Dietary Changes Recommended:     - Transition to lactose-free milk and carefully monitor other dairy intake.     - Limit the intake of high-carbohydrate foods and gas-producing vegetables, such as broccoli and Brussels sprouts.     - Consider the use of lactase pills or Gas-X for immediate relief.   - Medication Prescribed:     - Omeprazole has been prescribed to reduce stomach acid, with a trial period of two weeks.   - Further Consultation: If symptoms persist, a consultation with a gastroenterologist is recommended. Please discuss with your family doctor for a referral if necessary.  Please make sure to stay well-hydrated and monitor your symptoms closely. Should you have any questions or require further assistance, do not hesitate to contact our office.  Wishing you a healthy journey ahead and a wonderful Christmas season.  Best regards,  Dr. [facility]    Important Information   Due to recent changes in healthcare laws, you may see results of your pathology and/or laboratory studies on MyChart before the doctors have had a chance to review them. We understand that in some cases there may be  results that are confusing or concerning to you. Please understand that not all results are received at the same time and often the doctors may need to interpret multiple results in order to provide you with the best plan of care or course of treatment. Therefore, we ask that you please give [name] 2 business days to thoroughly review all your results before contacting the office for clarification. Should we see a critical lab result, you will be contacted sooner.     If You Need Anything After Your Visit   If you have any questions or concerns for your doctor, please call our main line at [phone]. If no one answers, please leave a voicemail as directed and we will return your call as soon as possible. Messages left after 4 pm will be answered the following business day.    You may also send [name] a message via MyChart. We typically respond to MyChart messages within 1-2 business days.  For prescription refills, please ask your pharmacy to contact our office. Our fax number is [phone].  If you have an urgent issue when the clinic is closed that cannot wait until the next business day, you can page your doctor at the number below.     Please note that while we do our best to be available for urgent issues outside of office hours, we are not available 24/7.    If you have an urgent issue and are unable to reach [name], you may choose to seek  medical care at your doctor's office, retail clinic, urgent care center, or emergency room.   If you have a medical emergency, please immediately call 911 or go to the emergency department. In the event of inclement weather, please call our main line at [phone] for an update on the status of any delays or closures.  Dermatology Medication Tips: Please keep the boxes that topical medications come in in order to help keep track of the instructions about where and how to use these. Pharmacies typically print the medication instructions only on the boxes and  not directly on the medication tubes.   If your medication is too expensive, please contact our office at [phone] or send [name] a message through MyChart.    We are unable to tell what your co-pay for medications will be in advance as this is different depending on your insurance coverage. However, we may be able to find a substitute medication at lower cost or fill out paperwork to get insurance to cover a needed medication.    If a prior authorization is required to get your medication covered by your insurance company, please allow [name] 1-2 business days to complete this process.   Drug prices often vary depending on where the prescription is filled and some pharmacies may offer cheaper prices.   The website www.goodrx.com contains coupons for medications through different pharmacies. The prices here do not account for what the cost may be with help from insurance (it may be cheaper with your insurance), but the website can give you the price if you did not use any insurance.  - You can print the associated coupon and take it with your prescription to the pharmacy.  - You may also stop by our office during regular business hours and pick up a GoodRx coupon card.  - If you need your prescription sent electronically to a different pharmacy, notify our office through [facility] or by phone at [phone]

## 2023-03-19 ENCOUNTER — Ambulatory Visit: Payer: Medicaid Other | Admitting: Dermatology

## 2023-03-19 LAB — ANCA PROFILE (RDL)
ANCA by IFA (RDL): NEGATIVE
Anti-MPO Ab (RDL): <20 U (ref <20)
Anti-PR-3 Ab (RDL): <20 U (ref <20)

## 2023-03-19 LAB — CBC WITH DIFFERENTIAL/PLATELET
Basophils Absolute: 0 10*3/uL (ref 0.0–0.2)
Basos: 0 %
EOS (ABSOLUTE): 0.1 10*3/uL (ref 0.0–0.4)
Eos: 1 %
Hematocrit: 42.3 % (ref 34.0–46.6)
Hemoglobin: 13.4 g/dL (ref 11.1–15.9)
Immature Grans (Abs): 0 10*3/uL (ref 0.0–0.1)
Immature Granulocytes: 0 %
Lymphocytes Absolute: 2.5 10*3/uL (ref 0.7–3.1)
Lymphs: 28 %
MCH: 25.8 pg — ABNORMAL LOW (ref 26.6–33.0)
MCHC: 31.7 g/dL (ref 31.5–35.7)
MCV: 81 fL (ref 79–97)
Monocytes Absolute: 0.4 10*3/uL (ref 0.1–0.9)
Monocytes: 4 %
Neutrophils Absolute: 5.9 10*3/uL (ref 1.4–7.0)
Neutrophils: 67 %
Platelets: 248 10*3/uL (ref 150–450)
RBC: 5.2 x10E6/uL (ref 3.77–5.28)
RDW: 13.1 % (ref 11.7–15.4)
WBC: 8.9 10*3/uL (ref 3.4–10.8)

## 2023-03-19 LAB — ANA,IFA RA DIAG PNL W/RFLX TIT/PATN
ANA Titer 1: NEGATIVE
Cyclic Citrullin Peptide Ab: 4 U (ref 0–19)
Rheumatoid fact SerPl-aCnc: <10 [IU]/mL (ref <14.0)

## 2023-04-01 ENCOUNTER — Ambulatory Visit: Payer: Medicaid Other

## 2023-04-01 NOTE — Progress Notes (Signed)
Depo injection given in left upper outer gult. - patient tolerated well

## 2023-04-05 ENCOUNTER — Ambulatory Visit: Payer: Medicaid Other | Admitting: Family Medicine

## 2023-04-12 ENCOUNTER — Ambulatory Visit: Payer: Medicaid Other | Admitting: Family Medicine

## 2023-04-12 ENCOUNTER — Ambulatory Visit (INDEPENDENT_AMBULATORY_CARE_PROVIDER_SITE_OTHER): Payer: Medicaid Other

## 2023-04-12 ENCOUNTER — Encounter: Payer: Self-pay | Admitting: Family Medicine

## 2023-04-12 VITALS — BP 119/76 | HR 88 | Ht 63.0 in | Wt 232.0 lb

## 2023-04-12 DIAGNOSIS — K219 Gastro-esophageal reflux disease without esophagitis: Secondary | ICD-10-CM

## 2023-04-12 DIAGNOSIS — K5900 Constipation, unspecified: Secondary | ICD-10-CM | POA: Diagnosis not present

## 2023-04-12 DIAGNOSIS — R109 Unspecified abdominal pain: Secondary | ICD-10-CM | POA: Diagnosis not present

## 2023-04-12 MED ORDER — POLYETHYLENE GLYCOL 3350 17 GM/SCOOP PO POWD: 17.0000 g | Freq: Every day | ORAL | 1 refills | Status: AC

## 2023-04-12 MED ORDER — PANTOPRAZOLE SODIUM 40 MG PO TBEC: 40.0000 mg | DELAYED_RELEASE_TABLET | Freq: Every day | ORAL | 1 refills | Status: DC

## 2023-04-12 NOTE — Progress Notes (Signed)
BP 119/76   Pulse 88   Ht 5\' 3"  (1.6 m)   Wt 232 lb (105.2 kg)   SpO2 98%   BMI 41.10 kg/m    Subjective:   Patient ID: Nichole [name], female    DOB: 2001-01-12, 23 y.o.   MRN: [id number]  HPI: Nichole [name] is a 23 y.o. female presenting on 04/12/2023 for Gastroesophageal Reflux and Abdominal Pain   HPI Patient is coming in today with abdominal complaints that have been worsening.  She says she will get a lot of indigestion and belching and burping, she says she will get it at nighttime and sometimes during the daytime as well and then after she eats she will feel very full and she has had some constipation and feeling like she needs to strain.  She has been fighting this off and on for few months but is definitely worsened over the past couple months.  She denies any blood in her stool that she can think she has.  She does have some vaginal bleeding off and on because of the Depo-Provera.  Sometimes she will feel like she will get some rectal pain when she has to strain or has a hard bowel movement but usually that goes away quickly.  Relevant past medical, surgical, family and social history reviewed and updated as indicated. Interim medical history since our last visit reviewed. Allergies and medications reviewed and updated.  Review of Systems  Constitutional:  Negative for chills and fever.  HENT:  Negative for congestion, ear discharge and ear pain.   Eyes:  Negative for redness and visual disturbance.  Respiratory:  Negative for chest tightness and shortness of breath.   Cardiovascular:  Negative for chest pain and leg swelling.  Gastrointestinal:  Positive for abdominal pain, constipation, diarrhea, nausea and vomiting. Negative for blood in stool.  Genitourinary:  Negative for difficulty urinating and dysuria.  Musculoskeletal:  Negative for back pain and gait problem.  Skin:  Negative for rash.  Neurological:  Negative for light-headedness and headaches.   Psychiatric/Behavioral:  Negative for agitation and behavioral problems.   All other systems reviewed and are negative.   Per HPI unless specifically indicated above   Allergies as of 04/12/2023       Reactions   Other Other (See Comments)   Patient has noted that one of her arms has become red/irritated/"bumpy" from an unknown/unnamed allergen to which she feels she's been exposed while in the hospital.        Medication List        Accurate as of April 12, 2023 11:41 AM. If you have any questions, ask your nurse or doctor.          albuterol 108 (90 Base) MCG/ACT inhaler Commonly known as: VENTOLIN HFA Inhale 2 puffs into the lungs every 6 (six) hours as needed.   medroxyPROGESTERone 150 MG/ML injection Commonly known as: Depo-Provera Inject 1 mL (150 mg total) into the muscle every 3 (three) months.   pantoprazole 40 MG tablet Commonly known as: PROTONIX Take 1 tablet (40 mg total) by mouth daily. Started by: Elige Radon Kaleth Koy   polyethylene glycol powder 17 GM/SCOOP powder Commonly known as: GLYCOLAX/MIRALAX Take 17 g by mouth daily for 7 days. Started by: Elige Radon Ozan Maclay         Objective:   BP 119/76   Pulse 88   Ht 5\' 3"  (1.6 m)   Wt 232 lb (105.2 kg)   SpO2 98%   BMI 41.10  kg/m   Wt Readings from Last 3 Encounters:  04/12/23 232 lb (105.2 kg)  01/14/23 226 lb (102.5 kg)  12/27/22 225 lb 3.2 oz (102.2 kg)    Physical Exam Vitals and nursing note reviewed.  Constitutional:      General: She is not in acute distress.    Appearance: She is well-developed. She is not diaphoretic.  Eyes:     Conjunctiva/sclera: Conjunctivae normal.  Abdominal:     General: Abdomen is flat. Bowel sounds are normal. There is no distension.     Palpations: Abdomen is soft.     Tenderness: There is abdominal tenderness. There is no right CVA tenderness, left CVA tenderness, guarding or rebound.  Musculoskeletal:        General: No tenderness. Normal  range of motion.  Skin:    General: Skin is warm and dry.     Findings: No rash.  Neurological:     Mental Status: She is alert and oriented to person, place, and time.     Coordination: Coordination normal.  Psychiatric:        Behavior: Behavior normal.     KUB: Moderate stool burden throughout the colon  Assessment & Plan:   Problem List Items Addressed This Visit   None Visit Diagnoses       Constipation, unspecified constipation type    -  Primary   Relevant Medications   polyethylene glycol powder (GLYCOLAX/MIRALAX) 17 GM/SCOOP powder   Other Relevant Orders   DG Abd 1 View     Abdominal pain, unspecified abdominal location       Relevant Medications   pantoprazole (PROTONIX) 40 MG tablet   polyethylene glycol powder (GLYCOLAX/MIRALAX) 17 GM/SCOOP powder   Other Relevant Orders   DG Abd 1 View     Gastroesophageal reflux disease without esophagitis       Relevant Medications   pantoprazole (PROTONIX) 40 MG tablet   polyethylene glycol powder (GLYCOLAX/MIRALAX) 17 GM/SCOOP powder   Other Relevant Orders   DG Abd 1 View       Recommended that she do MiraLAX for a week every day and do the Protonix for a month every day to, calm things down.  It does appear to be related to the constipation backing things up and causing some reflux. Follow up plan: Return if symptoms worsen or fail to improve.  Counseling provided for all of the vaccine components Orders Placed This Encounter  Procedures   DG Abd 1 View    Arville Care, MD [facility] 04/12/2023, 11:41 AM

## 2023-04-15 ENCOUNTER — Encounter: Payer: Self-pay | Admitting: Family Medicine

## 2023-05-05 ENCOUNTER — Other Ambulatory Visit: Payer: Self-pay | Admitting: Family Medicine

## 2023-05-05 DIAGNOSIS — R109 Unspecified abdominal pain: Secondary | ICD-10-CM

## 2023-05-05 DIAGNOSIS — K219 Gastro-esophageal reflux disease without esophagitis: Secondary | ICD-10-CM

## 2023-05-16 ENCOUNTER — Ambulatory Visit: Payer: Self-pay | Admitting: Family Medicine

## 2023-05-16 NOTE — Telephone Encounter (Addendum)
 This RN made third and final attempt to triage patient. No answer, unable to leave a message. Phone rang and received "this call could not be completed as dialed error." Will route to office for follow-up.   This RN made second attempt to triage patient. No answer, unable to leave a message. Phone rang and received "this call could not be completed as dialed error." Will route for continued attempts.

## 2023-05-16 NOTE — Telephone Encounter (Signed)
 Unable to reach patient. Will try again  Copied from CRM [id number]. Topic: Clinical - Medication Question >> May 16, 2023  1:23 PM Nichole [name] wrote: Reason for CRM: Pt asking if she is going to gain weight by taking pantoprazole (PROTONIX) 40 MG tablet? Please follow up.

## 2023-05-30 ENCOUNTER — Encounter: Payer: Self-pay | Admitting: Family Medicine

## 2023-05-30 ENCOUNTER — Ambulatory Visit: Admitting: Family Medicine

## 2023-05-30 VITALS — BP 108/67 | HR 88 | Temp 98.5°F | Ht 63.0 in | Wt 224.0 lb

## 2023-05-30 DIAGNOSIS — R300 Dysuria: Secondary | ICD-10-CM | POA: Diagnosis not present

## 2023-05-30 DIAGNOSIS — K5900 Constipation, unspecified: Secondary | ICD-10-CM | POA: Diagnosis not present

## 2023-05-30 LAB — URINALYSIS, ROUTINE W REFLEX MICROSCOPIC
Bilirubin, UA: NEGATIVE
Glucose, UA: NEGATIVE
Ketones, UA: NEGATIVE
Leukocytes,UA: NEGATIVE
Nitrite, UA: NEGATIVE
Specific Gravity, UA: >1.03 — ABNORMAL HIGH (ref 1.005–1.030)
Urobilinogen, Ur: 0.2 mg/dL (ref 0.2–1.0)
pH, UA: 5.5 (ref 5.0–7.5)

## 2023-05-30 LAB — MICROSCOPIC EXAMINATION

## 2023-05-30 NOTE — Patient Instructions (Signed)
 Thank you for coming in to clinic today.  1. Your symptoms are consistent with Constipation, likely cause of your General Abdominal Pain / Cramping. 2. Start with Miralax. First dose 68g (4 capfuls) in 32oz water over 1 to 2 hours for clean out. Next day start 17g or 1 capful daily, may adjust dose up or down by half a capful every few days. Recommend to take this medicine daily for next 1-2 weeks, you may need to use it longer if needed. - Goal is to have soft regular bowel movement 1-3x daily, if too runny or diarrhea, then reduce dose of the medicine to every other day.  Improve water intake, hydration will help Also recommend increased vegetables, fruits, fiber intake Can try daily Metamucil or Fiber supplement at pharmacy over the counter  Follow-up if symptoms are not improving with bowel movements, or if pain worsens, develop fevers, nausea, vomiting.  Please schedule a follow-up appointment in 1 month to follow-up Constipation  If you have any other questions or concerns, please feel free to call the clinic to contact me. You may also schedule an earlier appointment if necessary.  However, if your symptoms get significantly worse, please go to the Emergency Department to seek immediate medical attention.

## 2023-05-30 NOTE — Progress Notes (Signed)
 Subjective:  Patient ID: Nichole [name], female    DOB: 25-Jul-2000, 23 y.o.   MRN: [id number]  Patient Care Team: Dettinger, Elige Radon, MD as PCP - General (Family Medicine)   Chief Complaint:  Dysuria  HPI: Nichole [name] is a 23 y.o. female presenting on 05/30/2023 for Dysuria  Dysuria  This is a new problem. The current episode started in the past 7 days. The problem occurs every urination. The quality of the pain is described as burning. There has been no fever. She is Sexually active. Associated symptoms include chills, a discharge, flank pain, frequency, hematuria and hesitancy. Pertinent negatives include no nausea, possible pregnancy, sweats, urgency or vomiting. Associated symptoms comments: burning. She has tried NSAIDs (goody powder, midol) for the symptoms. The treatment provided no relief.  Reports some constipation. States that she is having a BM every 2-3 days. States that she will try to go and cannot.  Relevant past medical, surgical, family, and social history reviewed and updated as indicated.  Allergies and medications reviewed and updated. Data reviewed: Chart in Epic.  Past Medical History:  Diagnosis Date   Asthma    Past Surgical History:  Procedure Laterality Date   NO PAST SURGERIES      Social History   Socioeconomic History   Marital status: Single    Spouse name: Not on file   Number of children: Not on file   Years of education: Not on file   Highest education level: Not on file  Occupational History   Not on file  Tobacco Use   Smoking status: Never    Passive exposure: Never   Smokeless tobacco: Never  Vaping Use   Vaping status: Never Used  Substance and Sexual Activity   Alcohol use: No   Drug use: No   Sexual activity: Not Currently    Birth control/protection: None  Other Topics Concern   Not on file  Social History Narrative   Not on file   Social Drivers of Health   Financial Resource Strain: Low Risk  (12/19/2021)    Overall Financial Resource Strain (CARDIA)    Difficulty of Paying Living Expenses: Not hard at all  Food Insecurity: No Food Insecurity (06/23/2022)   Hunger Vital Sign    Worried About Running Out of Food in the Last Year: Never true    Ran Out of Food in the Last Year: Never true  Transportation Needs: No Transportation Needs (06/23/2022)   PRAPARE - Administrator, Civil Service (Medical): No    Lack of Transportation (Non-Medical): No  Physical Activity: Insufficiently Active (12/19/2021)   Exercise Vital Sign    Days of Exercise per Week: 2 days    Minutes of Exercise per Session: 30 min  Stress: No Stress Concern Present (12/19/2021)   [employer] of Occupational Health - Occupational Stress Questionnaire    Feeling of Stress : Not at all  Social Connections: Moderately Integrated (12/19/2021)   Social Connection and Isolation Panel [NHANES]    Frequency of Communication with Friends and Family: Twice a week    Frequency of Social Gatherings with Friends and Family: Once a week    Attends Religious Services: 1 to 4 times per year    Active Member of [employer] or Organizations: No    Attends Banker Meetings: Never    Marital Status: Married  Catering manager Violence: Not At Risk (06/23/2022)   Humiliation, Afraid, Rape, and Kick questionnaire    Fear of  Current or Ex-Partner: No    Emotionally Abused: No    Physically Abused: No    Sexually Abused: No    Outpatient Encounter Medications as of 05/30/2023  Medication Sig   albuterol (VENTOLIN HFA) 108 (90 Base) MCG/ACT inhaler Inhale 2 puffs into the lungs every 6 (six) hours as needed.   medroxyPROGESTERone (DEPO-PROVERA) 150 MG/ML injection Inject 1 mL (150 mg total) into the muscle every 3 (three) months.   pantoprazole (PROTONIX) 40 MG tablet TAKE 1 TABLET BY MOUTH EVERY DAY   Facility-Administered Encounter Medications as of 05/30/2023  Medication   medroxyPROGESTERone Acetate SUSY 150 mg     Allergies  Allergen Reactions   Other Other (See Comments)    Patient has noted that one of her arms has become red/irritated/"bumpy" from an unknown/unnamed allergen to which she feels she's been exposed while in the hospital.    Review of Systems  Constitutional:  Positive for chills.  Gastrointestinal:  Negative for nausea and vomiting.  Genitourinary:  Positive for dysuria, flank pain, frequency, hematuria and hesitancy. Negative for urgency.    Objective:  BP 108/67   Pulse 88   Temp 98.5 F (36.9 C)   Ht 5\' 3"  (1.6 m)   Wt 224 lb (101.6 kg)   SpO2 97%   BMI 39.68 kg/m    Wt Readings from Last 3 Encounters:  05/30/23 224 lb (101.6 kg)  04/12/23 232 lb (105.2 kg)  01/14/23 226 lb (102.5 kg)    Physical Exam Constitutional:      General: She is awake. She is not in acute distress.    Appearance: Normal appearance. She is well-developed and well-groomed. She is not ill-appearing, toxic-appearing or diaphoretic.  Cardiovascular:     Rate and Rhythm: Normal rate and regular rhythm.     Pulses: Normal pulses.          Radial pulses are 2+ on the right side and 2+ on the left side.       Posterior tibial pulses are 2+ on the right side and 2+ on the left side.     Heart sounds: Normal heart sounds. No murmur heard.    No gallop.  Pulmonary:     Effort: Pulmonary effort is normal. No respiratory distress.     Breath sounds: Normal breath sounds. No stridor. No wheezing, rhonchi or rales.  Abdominal:     General: Abdomen is flat. Bowel sounds are normal. There is no distension.     Palpations: Abdomen is soft. There is no mass.     Tenderness: There is no abdominal tenderness. There is no right CVA tenderness, left CVA tenderness or guarding.  Musculoskeletal:     Cervical back: Full passive range of motion without pain and neck supple.     Right lower leg: No edema.     Left lower leg: No edema.  Skin:    General: Skin is warm.     Capillary Refill: Capillary  refill takes less than 2 seconds.  Neurological:     General: No focal deficit present.     Mental Status: She is alert, oriented to person, place, and time and easily aroused. Mental status is at baseline.     GCS: GCS eye subscore is 4. GCS verbal subscore is 5. GCS motor subscore is 6.     Motor: No weakness.  Psychiatric:        Attention and Perception: Attention and perception normal.        Mood and  Affect: Mood and affect normal.        Speech: Speech normal.        Behavior: Behavior normal. Behavior is cooperative.        Thought Content: Thought content normal. Thought content does not include homicidal or suicidal ideation. Thought content does not include homicidal or suicidal plan.        Cognition and Memory: Cognition and memory normal.        Judgment: Judgment normal.     Results for orders placed or performed in visit on 03/07/23  CBC with Differential/Platelets   Collection Time: 03/07/23  1:12 PM  Result Value Ref Range   WBC 8.9 3.4 - 10.8 x10E3/uL   RBC 5.20 3.77 - 5.28 x10E6/uL   Hemoglobin 13.4 11.1 - 15.9 g/dL   Hematocrit 57.8 46.9 - 46.6 %   MCV 81 79 - 97 fL   MCH 25.8 (L) 26.6 - 33.0 pg   MCHC 31.7 31.5 - 35.7 g/dL   RDW 62.9 52.8 - 41.3 %   Platelets 248 150 - 450 x10E3/uL   Neutrophils 67 Not Estab. %   Lymphs 28 Not Estab. %   Monocytes 4 Not Estab. %   Eos 1 Not Estab. %   Basos 0 Not Estab. %   Neutrophils Absolute 5.9 1.4 - 7.0 x10E3/uL   Lymphocytes Absolute 2.5 0.7 - 3.1 x10E3/uL   Monocytes Absolute 0.4 0.1 - 0.9 x10E3/uL   EOS (ABSOLUTE) 0.1 0.0 - 0.4 x10E3/uL   Basophils Absolute 0.0 0.0 - 0.2 x10E3/uL   Immature Granulocytes 0 Not Estab. %   Immature Grans (Abs) 0.0 0.0 - 0.1 x10E3/uL  ANA,IFA RA Diag Pnl w/rflx Tit/Patn   Collection Time: 03/07/23  1:12 PM  Result Value Ref Range   ANA Titer 1 Negative    Rheumatoid fact SerPl-aCnc <10.0 <14.0 IU/mL   Cyclic Citrullin Peptide Ab 4 0 - 19 units  ANCA Profile (RDL)   Collection  Time: 03/07/23  1:12 PM  Result Value Ref Range   ANCA by IFA (RDL) Negative Negative   Anti-MPO Ab (RDL) <20 <20 Units   Anti-PR-3 Ab (RDL) <20 <20 Units       05/30/2023    3:41 PM 04/12/2023   11:08 AM 01/14/2023    8:20 AM 11/16/2022    3:32 PM 03/28/2022    9:13 AM  Depression screen PHQ 2/9  Decreased Interest 1 0 0 0 1  Down, Depressed, Hopeless 0 0 0 0 0  PHQ - 2 Score 1 0 0 0 1  Altered sleeping 1  1 1 1   Tired, decreased energy 2  0 1 1  Change in appetite 2  1 1  0  Feeling bad or failure about yourself  0  0 0 0  Trouble concentrating 1  0 0 0  Moving slowly or fidgety/restless 1  0 0 0  Suicidal thoughts 0  0 0 0  PHQ-9 Score 8  2 3 3   Difficult doing work/chores Not difficult at all  Not difficult at all Not difficult at all        01/14/2023    8:21 AM 11/16/2022    3:32 PM 03/28/2022    9:14 AM 12/05/2021   11:42 AM  GAD 7 : Generalized Anxiety Score  Nervous, Anxious, on Edge 0 0 0 0  Control/stop worrying 0 0 0 0  Worry too much - different things 0 0 0 0  Trouble relaxing 0 0 0 0  Restless 0 0 0 0  Easily annoyed or irritable 0 0 0 0  Afraid - awful might happen 0 0 0 0  Total GAD 7 Score 0 0 0 0  Anxiety Difficulty Not difficult at all Not difficult at all        Pertinent labs & imaging results that were available during my care of the patient were reviewed by me and considered in my medical decision making.  Assessment & Plan:  Brent was seen today for dysuria.  Diagnoses and all orders for this visit:  Dysuria Based on UA, will await culture and nuswab results to treat. Constipation may be contributing to symptoms. Provided handout.  -     Urinalysis, Routine w reflex microscopic -     Urine Culture -     NuSwab Vaginitis Plus (VG+)  Constipation, unspecified constipation type Provided patient with bowel cleanout regimen.   Continue all other maintenance medications.  Follow up plan: Return if symptoms worsen or fail to  improve.   Continue healthy lifestyle choices, including diet (rich in fruits, vegetables, and lean proteins, and low in salt and simple carbohydrates) and exercise (at least 30 minutes of moderate physical activity daily).  Written and verbal instructions provided   The above assessment and management plan was discussed with the patient. The patient verbalized understanding of and has agreed to the management plan. Patient is aware to call the clinic if they develop any new symptoms or if symptoms persist or worsen. Patient is aware when to return to the clinic for a follow-up visit. Patient educated on when it is appropriate to go to the emergency department.   Neale Burly, DNP-FNP [facility] [address] [place], [place] [address] [phone]

## 2023-05-31 LAB — NUSWAB VAGINITIS PLUS (VG+)
Candida albicans, NAA: NEGATIVE
Candida glabrata, NAA: NEGATIVE
Chlamydia trachomatis, NAA: NEGATIVE
Neisseria gonorrhoeae, NAA: NEGATIVE
Trich vag by NAA: NEGATIVE

## 2023-06-01 LAB — URINE CULTURE

## 2023-06-03 ENCOUNTER — Encounter: Payer: Self-pay | Admitting: Family Medicine

## 2023-06-03 NOTE — Progress Notes (Signed)
 Urine culture positive for Strep B. Is patient still having symptoms? If so, I can send in abx.

## 2023-06-05 MED ORDER — AMOXICILLIN 875 MG PO TABS: 875.0000 mg | ORAL_TABLET | Freq: Two times a day (BID) | ORAL | 0 refills | Status: AC

## 2023-06-05 NOTE — Progress Notes (Signed)
 Abx sent in for patient.

## 2023-06-05 NOTE — Addendum Note (Signed)
 Addended by: Neale Burly on: 06/05/2023 07:56 AM   Modules accepted: Orders

## 2023-06-17 ENCOUNTER — Ambulatory Visit: Payer: Medicaid Other

## 2023-07-03 ENCOUNTER — Encounter: Payer: Self-pay | Admitting: Family Medicine

## 2023-07-03 ENCOUNTER — Ambulatory Visit (INDEPENDENT_AMBULATORY_CARE_PROVIDER_SITE_OTHER): Admitting: Family Medicine

## 2023-07-03 VITALS — BP 115/74 | HR 95 | Temp 97.4°F | Ht 63.0 in | Wt 228.2 lb

## 2023-07-03 DIAGNOSIS — M26609 Unspecified temporomandibular joint disorder, unspecified side: Secondary | ICD-10-CM | POA: Diagnosis not present

## 2023-07-03 DIAGNOSIS — N926 Irregular menstruation, unspecified: Secondary | ICD-10-CM | POA: Diagnosis not present

## 2023-07-03 LAB — PREGNANCY, URINE: Preg Test, Ur: NEGATIVE

## 2023-07-03 MED ORDER — NAPROXEN 500 MG PO TABS: 500.0000 mg | ORAL_TABLET | Freq: Two times a day (BID) | ORAL | 0 refills | Status: AC

## 2023-07-03 NOTE — Progress Notes (Signed)
 Subjective:  Patient ID: Nichole [name], female    DOB: [birth year], 23 y.o.   MRN: [id number]  Patient Care Team: Dettinger, Elige Radon, MD as PCP - General (Family Medicine)   Chief Complaint:  Dental Pain (Right side of mouth x 2 weeks )   HPI: Nichole [name] is a 23 y.o. female presenting on 07/03/2023 for Dental Pain (Right side of mouth x 2 weeks )   Discussed the use of AI scribe software for clinical note transcription with the patient, who gave verbal consent to proceed.  History of Present Illness   Nichole [name] is a 23 year old female who presents with jaw pain and clicking.  She experiences pain in her jaw, particularly near the ear, which began after yawning. She believes she may have opened her mouth incorrectly, leading to discomfort. The pain occurs when she yawns and sometimes when she moves her mouth sideways, which occasionally results in a cracking sound.  No jaw locking is noted, but she mentions clicking that has since stopped. She has not tried any treatments at home for these symptoms.  She also experiences headaches, which she associates with the jaw pain. These headaches have been present for the past three days.  The pain worsens when she has to open her mouth wide, such as when taking a large bite of food.          Relevant past medical, surgical, family, and social history reviewed and updated as indicated.  Allergies and medications reviewed and updated. Data reviewed: Chart in Epic.   Past Medical History:  Diagnosis Date   Asthma     Past Surgical History:  Procedure Laterality Date   NO PAST SURGERIES      Social History   Socioeconomic History   Marital status: Single    Spouse name: Not on file   Number of children: Not on file   Years of education: Not on file   Highest education level: Not on file  Occupational History   Not on file  Tobacco Use   Smoking status: Never    Passive exposure: Never   Smokeless  tobacco: Never  Vaping Use   Vaping status: Never Used  Substance and Sexual Activity   Alcohol use: No   Drug use: No   Sexual activity: Not Currently    Birth control/protection: None  Other Topics Concern   Not on file  Social History Narrative   Not on file   Social Drivers of Health   Financial Resource Strain: Low Risk  (12/19/2021)   Overall Financial Resource Strain (CARDIA)    Difficulty of Paying Living Expenses: Not hard at all  Food Insecurity: No Food Insecurity (06/23/2022)   Hunger Vital Sign    Worried About Running Out of Food in the Last Year: Never true    Ran Out of Food in the Last Year: Never true  Transportation Needs: No Transportation Needs (06/23/2022)   PRAPARE - Administrator, Civil Service (Medical): No    Lack of Transportation (Non-Medical): No  Physical Activity: Insufficiently Active (12/19/2021)   Exercise Vital Sign    Days of Exercise per Week: 2 days    Minutes of Exercise per Session: 30 min  Stress: No Stress Concern Present (12/19/2021)   [employer] of Occupational Health - Occupational Stress Questionnaire    Feeling of Stress : Not at all  Social Connections: Moderately Integrated (12/19/2021)   Social Connection and Isolation Panel [NHANES]  Frequency of Communication with Friends and Family: Twice a week    Frequency of Social Gatherings with Friends and Family: Once a week    Attends Religious Services: 1 to 4 times per year    Active Member of [employer] or Organizations: No    Attends Banker Meetings: Never    Marital Status: Married  Catering manager Violence: Not At Risk (06/23/2022)   Humiliation, Afraid, Rape, and Kick questionnaire    Fear of Current or Ex-Partner: No    Emotionally Abused: No    Physically Abused: No    Sexually Abused: No    Outpatient Encounter Medications as of 07/03/2023  Medication Sig   albuterol (VENTOLIN HFA) 108 (90 Base) MCG/ACT inhaler Inhale 2 puffs into the lungs  every 6 (six) hours as needed.   naproxen (NAPROSYN) 500 MG tablet Take 1 tablet (500 mg total) by mouth 2 (two) times daily with a meal for 14 days.   medroxyPROGESTERone (DEPO-PROVERA) 150 MG/ML injection Inject 1 mL (150 mg total) into the muscle every 3 (three) months. (Patient not taking: Reported on 07/03/2023)   pantoprazole (PROTONIX) 40 MG tablet TAKE 1 TABLET BY MOUTH EVERY DAY (Patient not taking: Reported on 07/03/2023)   Facility-Administered Encounter Medications as of 07/03/2023  Medication   medroxyPROGESTERone Acetate SUSY 150 mg    Allergies  Allergen Reactions   Other Other (See Comments)    Patient has noted that one of her arms has become red/irritated/"bumpy" from an unknown/unnamed allergen to which she feels she's been exposed while in the hospital.    Pertinent ROS per HPI, otherwise unremarkable      Objective:  BP 115/74   Pulse 95   Temp (!) 97.4 F (36.3 C)   Ht 5\' 3"  (1.6 m)   Wt 228 lb 3.2 oz (103.5 kg)   SpO2 96%   BMI 40.42 kg/m    Wt Readings from Last 3 Encounters:  07/03/23 228 lb 3.2 oz (103.5 kg)  05/30/23 224 lb (101.6 kg)  04/12/23 232 lb (105.2 kg)    Physical Exam Vitals and nursing note reviewed.  Constitutional:      Appearance: Normal appearance. She is obese.  HENT:     Head: Normocephalic and atraumatic.     Jaw: Tenderness and pain on movement (left TM joint, clicking with opening and closing) present.     Right Ear: Tympanic membrane, ear canal and external ear normal.     Left Ear: Tympanic membrane, ear canal and external ear normal.     Nose: Nose normal.     Mouth/Throat:     Mouth: Mucous membranes are moist.     Pharynx: Oropharynx is clear.  Eyes:     Conjunctiva/sclera: Conjunctivae normal.     Pupils: Pupils are equal, round, and reactive to light.  Cardiovascular:     Rate and Rhythm: Normal rate and regular rhythm.     Heart sounds: Normal heart sounds.  Pulmonary:     Effort: Pulmonary effort is normal.      Breath sounds: Normal breath sounds.  Skin:    General: Skin is warm and dry.     Capillary Refill: Capillary refill takes less than 2 seconds.  Neurological:     General: No focal deficit present.     Mental Status: She is alert and oriented to person, place, and time.  Psychiatric:        Mood and Affect: Mood normal.  Behavior: Behavior normal.        Thought Content: Thought content normal.        Judgment: Judgment normal.     Results for orders placed or performed in visit on 05/30/23  Urine Culture   Collection Time: 05/30/23  3:46 PM   Specimen: Urine   UR  Result Value Ref Range   Urine Culture, Routine Final report (A)    Organism ID, Bacteria Comment (A)    ORGANISM ID, BACTERIA Comment   Microscopic Examination   Collection Time: 05/30/23  3:46 PM   Urine  Result Value Ref Range   WBC, UA 0-5 0 - 5 /hpf   RBC, Urine 0-2 0 - 2 /hpf   Epithelial Cells (non renal) 0-10 0 - 10 /hpf   Bacteria, UA Moderate (A) None seen/Few  Urinalysis, Routine w reflex microscopic   Collection Time: 05/30/23  3:46 PM  Result Value Ref Range   Specific Gravity, UA >1.030 (H) 1.005 - 1.030   pH, UA 5.5 5.0 - 7.5   Color, UA Yellow Yellow   Appearance Ur Cloudy (A) Clear   Leukocytes,UA Negative Negative   Protein,UA 2+ (A) Negative/Trace   Glucose, UA Negative Negative   Ketones, UA Negative Negative   RBC, UA Trace (A) Negative   Bilirubin, UA Negative Negative   Urobilinogen, Ur 0.2 0.2 - 1.0 mg/dL   Nitrite, UA Negative Negative   Microscopic Examination See below:   NuSwab Vaginitis Plus (VG+)   Collection Time: 05/30/23  4:14 PM  Result Value Ref Range   Atopobium vaginae Low - 0 Score   BVAB 2 Low - 0 Score   Megasphaera 1 Low - 0 Score   Candida albicans, NAA Negative Negative   Candida glabrata, NAA Negative Negative   Trich vag by NAA Negative Negative   Chlamydia trachomatis, NAA Negative Negative   Neisseria gonorrhoeae, NAA Negative Negative        Pertinent labs & imaging results that were available during my care of the patient were reviewed by me and considered in my medical decision making.  Assessment & Plan:  Nichole [name] was seen today for dental pain.  Diagnoses and all orders for this visit:  TMJ (temporomandibular joint disorder) -     naproxen (NAPROSYN) 500 MG tablet; Take 1 tablet (500 mg total) by mouth 2 (two) times daily with a meal for 14 days.  Irregular menses Urine pregnancy negative.  -     Pregnancy, urine     Assessment and Plan    Temporomandibular Joint Disorder (TMJ) Jaw pain and clicking, especially when yawning or opening wide, indicate TMJ disorder. No jaw locking, but discomfort and clicking suggest tension in the joint, likely causing temporal headaches. Symptoms have persisted for three days. Inflammation or aggravation of the TMJ joint is likely contributing to these symptoms. - Prescribe naproxen to be taken twice daily with food for two weeks to reduce inflammation and alleviate pain. - Advise to chew softer foods and avoid wide mouth opening to prevent further joint strain. - Instruct to report if symptoms do not improve or worsen, as a referral to a specialist may be necessary.          Continue all other maintenance medications.  Follow up plan: Return if symptoms worsen or fail to improve.   Continue healthy lifestyle choices, including diet (rich in fruits, vegetables, and lean proteins, and low in salt and simple carbohydrates) and exercise (at least 30 minutes of moderate  physical activity daily).  Educational handout given for TMJ  The above assessment and management plan was discussed with the patient. The patient verbalized understanding of and has agreed to the management plan. Patient is aware to call the clinic if they develop any new symptoms or if symptoms persist or worsen. Patient is aware when to return to the clinic for a follow-up visit. Patient educated on when it is  appropriate to go to the emergency department.   Kattie Parrot, FNP-C [facility] [phone]

## 2023-07-13 ENCOUNTER — Emergency Department (HOSPITAL_BASED_OUTPATIENT_CLINIC_OR_DEPARTMENT_OTHER)

## 2023-07-13 ENCOUNTER — Other Ambulatory Visit: Payer: Self-pay

## 2023-07-13 ENCOUNTER — Emergency Department (HOSPITAL_BASED_OUTPATIENT_CLINIC_OR_DEPARTMENT_OTHER): Admission: EM | Admit: 2023-07-13 | Discharge: 2023-07-14 | Disposition: A | Discharge status: Home / Self Care

## 2023-07-13 DIAGNOSIS — R112 Nausea with vomiting, unspecified: Secondary | ICD-10-CM | POA: Diagnosis not present

## 2023-07-13 DIAGNOSIS — R1013 Epigastric pain: Secondary | ICD-10-CM | POA: Diagnosis not present

## 2023-07-13 DIAGNOSIS — R1011 Right upper quadrant pain: Secondary | ICD-10-CM | POA: Diagnosis not present

## 2023-07-13 LAB — CBC
HCT: 38.2 % (ref 36.0–46.0)
Hemoglobin: 12.5 g/dL (ref 12.0–15.0)
MCH: 25.8 pg — ABNORMAL LOW (ref 26.0–34.0)
MCHC: 32.7 g/dL (ref 30.0–36.0)
MCV: 78.8 fL — ABNORMAL LOW (ref 80.0–100.0)
Platelets: 219 10*3/uL (ref 150–400)
RBC: 4.85 MIL/uL (ref 3.87–5.11)
RDW: 14.1 % (ref 11.5–15.5)
WBC: 6.5 10*3/uL (ref 4.0–10.5)
nRBC: 0 % (ref 0.0–0.2)

## 2023-07-13 LAB — COMPREHENSIVE METABOLIC PANEL WITH GFR
ALT: 32 U/L (ref 0–44)
AST: 35 U/L (ref 15–41)
Albumin: 4.6 g/dL (ref 3.5–5.0)
Alkaline Phosphatase: 74 U/L (ref 38–126)
Anion gap: 11 (ref 5–15)
BUN: 10 mg/dL (ref 6–20)
CO2: 24 mmol/L (ref 22–32)
Calcium: 9.7 mg/dL (ref 8.9–10.3)
Chloride: 104 mmol/L (ref 98–111)
Creatinine, Ser: 0.68 mg/dL (ref 0.44–1.00)
GFR, Estimated: >60 mL/min (ref >60)
Glucose, Bld: 99 mg/dL (ref 70–99)
Potassium: 3.8 mmol/L (ref 3.5–5.1)
Sodium: 139 mmol/L (ref 135–145)
Total Bilirubin: 0.3 mg/dL (ref 0.0–1.2)
Total Protein: 7.2 g/dL (ref 6.5–8.1)

## 2023-07-13 LAB — LIPASE, BLOOD: Lipase: 38 U/L (ref 11–51)

## 2023-07-13 LAB — URINALYSIS, ROUTINE W REFLEX MICROSCOPIC
Bilirubin Urine: NEGATIVE
Glucose, UA: NEGATIVE mg/dL
Hgb urine dipstick: NEGATIVE
Ketones, ur: NEGATIVE mg/dL
Leukocytes,Ua: NEGATIVE
Nitrite: NEGATIVE
Protein, ur: NEGATIVE mg/dL
Specific Gravity, Urine: 1.02 (ref 1.005–1.030)
pH: 6.5 (ref 5.0–8.0)

## 2023-07-13 LAB — PREGNANCY, URINE: Preg Test, Ur: NEGATIVE

## 2023-07-13 MED ORDER — ONDANSETRON HCL 4 MG/2ML IJ SOLN
4.0000 mg | Freq: Once | INTRAMUSCULAR | Status: AC
  Administered 2023-07-13: 4 mg via INTRAVENOUS
  Filled 2023-07-13: qty 2

## 2023-07-13 MED ORDER — ONDANSETRON 4 MG PO TBDP: 4.0000 mg | ORAL_TABLET | Freq: Once | ORAL | Status: DC | PRN

## 2023-07-13 MED ORDER — ONDANSETRON 4 MG PO TBDP: 4.0000 mg | ORAL_TABLET | Freq: Three times a day (TID) | ORAL | 0 refills | Status: DC | PRN

## 2023-07-13 MED ORDER — HYDROMORPHONE HCL 1 MG/ML IJ SOLN
0.5000 mg | Freq: Once | INTRAMUSCULAR | Status: AC
  Administered 2023-07-13: 0.5 mg via INTRAVENOUS
  Filled 2023-07-13: qty 1

## 2023-07-13 MED ORDER — PANTOPRAZOLE SODIUM 40 MG PO TBEC: 40.0000 mg | DELAYED_RELEASE_TABLET | Freq: Every day | ORAL | 1 refills | Status: DC

## 2023-07-13 MED ORDER — DIPHENHYDRAMINE HCL 50 MG/ML IJ SOLN: 25.0000 mg | Freq: Once | INTRAMUSCULAR | Status: AC

## 2023-07-13 MED ORDER — IOHEXOL 300 MG/ML  SOLN
100.0000 mL | Freq: Once | INTRAMUSCULAR | Status: AC | PRN
  Administered 2023-07-13: 100 mL via INTRAVENOUS

## 2023-07-13 MED ORDER — DIPHENHYDRAMINE HCL 50 MG/ML IJ SOLN
INTRAMUSCULAR | Status: AC
  Administered 2023-07-13: 25 mg via INTRAVENOUS
  Filled 2023-07-13: qty 1

## 2023-07-13 NOTE — ED Notes (Signed)
 Pt coming back from CT, tech informed me that pt was getting itchy and developing a runny nose. Pt endorsed some tingling in the throat, no swelling noted, no difficulty in breathing or swallowing. MD made aware and came to the bedside to assess pt.

## 2023-07-13 NOTE — Discharge Instructions (Addendum)
 Please read and follow all provided instructions.  Your diagnoses today include:  1. Epigastric abdominal pain     Tests performed today include: Complete blood cell count: Normal white blood cell and red blood cell count Complete metabolic panel: Normal liver function test and kidney function Lipase (pancreas function test): Normal Urinalysis (urine test): No sign of infection Pregnancy test (urine or blood, in women only): Negative CT scan of the abdomen pelvis: Did not show any inflammation around the gallbladder or other problems in the abdomen tonight Vital signs. See below for your results today.   Medications prescribed:  Pantoprazole  - stomach acid reducer  Zofran  (ondansetron ) - for nausea and vomiting  Take any prescribed medications only as directed.  Home care instructions:  Follow any educational materials contained in this packet.  Follow-up instructions: Please follow-up with your primary care provider in the next 7 days for further evaluation of your symptoms.    Return instructions:  SEEK IMMEDIATE MEDICAL ATTENTION IF: The pain does not go away or becomes severe  A temperature above 101F develops  Repeated vomiting occurs (multiple episodes)  The pain becomes localized to portions of the abdomen. The right side could possibly be appendicitis. In an adult, the left lower portion of the abdomen could be colitis or diverticulitis.  Blood is being passed in stools or vomit (bright red or black tarry stools)  You develop chest pain, difficulty breathing, dizziness or fainting, or become confused, poorly responsive, or inconsolable (young children) If you have any other emergent concerns regarding your health  Additional Information: Abdominal (belly) pain can be caused by many things. Your caregiver performed an examination and possibly ordered blood/urine tests and imaging (CT scan, x-rays, ultrasound). Many cases can be observed and treated at home after initial  evaluation in the emergency department. Even though you are being discharged home, abdominal pain can be unpredictable. Therefore, you need a repeated exam if your pain does not resolve, returns, or worsens. Most patients with abdominal pain don't have to be admitted to the hospital or have surgery, but serious problems like appendicitis and gallbladder attacks can start out as nonspecific pain. Many abdominal conditions cannot be diagnosed in one visit, so follow-up evaluations are very important.  Your vital signs today were: BP 100/63   Pulse 67   Temp 98.1 F (36.7 C) (Oral)   Resp 14   Ht 5\' 4"  (1.626 m)   Wt 102.5 kg   SpO2 99%   BMI 38.79 kg/m  If your blood pressure (bp) was elevated above 135/85 this visit, please have this repeated by your doctor within one month. --------------

## 2023-07-13 NOTE — ED Triage Notes (Signed)
 Pt POV reporting RUQ abd pain that radiates to back that began yesterday, +n/v/d. Afebrile.

## 2023-07-13 NOTE — ED Provider Notes (Signed)
 [place] EMERGENCY DEPARTMENT AT [facility] Provider Note   CSN: [id number] Arrival date & time: 07/13/23  1920     History  No chief complaint on file.   Nichole [name] is a 23 y.o. female.  Patient with no past surgical history presents to the emergency department for evaluation of upper abdominal pain, nausea and vomiting starting around noon today.  Pain is in the upper abdomen with radiation to the back.  No urinary symptoms.  No chest pain or shortness of breath.  No reported fevers.       Home Medications Prior to Admission medications   Medication Sig Start Date End Date Taking? Authorizing Provider  albuterol  (VENTOLIN  HFA) 108 (90 Base) MCG/ACT inhaler Inhale 2 puffs into the lungs every 6 (six) hours as needed. 12/06/21   Dettinger, Lucio Sabin, MD  medroxyPROGESTERone  (DEPO-PROVERA ) 150 MG/ML injection Inject 1 mL (150 mg total) into the muscle every 3 (three) months. Patient not taking: Reported on 07/03/2023 01/14/23   Dettinger, Lucio Sabin, MD  naproxen  (NAPROSYN ) 500 MG tablet Take 1 tablet (500 mg total) by mouth 2 (two) times daily with a meal for 14 days. 07/03/23 07/17/23  Galvin Jules, FNP  pantoprazole  (PROTONIX ) 40 MG tablet TAKE 1 TABLET BY MOUTH EVERY DAY Patient not taking: Reported on 07/03/2023 05/06/23   Dettinger, Lucio Sabin, MD      Allergies    Other    Review of Systems   Review of Systems  Physical Exam Updated Vital Signs BP 113/73   Pulse 97   Temp 98.1 F (36.7 C) (Oral)   Resp 18   Ht 5\' 4"  (1.626 m)   Wt 102.5 kg   SpO2 99%   BMI 38.79 kg/m  Physical Exam Vitals and nursing note reviewed.  Constitutional:      General: She is not in acute distress.    Appearance: She is well-developed.  HENT:     Head: Normocephalic and atraumatic.     Right Ear: External ear normal.     Left Ear: External ear normal.     Nose: Nose normal.  Eyes:     Conjunctiva/sclera: Conjunctivae normal.  Cardiovascular:     Rate and  Rhythm: Normal rate and regular rhythm.     Heart sounds: No murmur heard. Pulmonary:     Effort: No respiratory distress.     Breath sounds: No wheezing, rhonchi or rales.  Abdominal:     Palpations: Abdomen is soft.     Tenderness: There is abdominal tenderness in the right upper quadrant and epigastric area. There is no guarding or rebound. Negative signs include Murphy's sign and McBurney's sign.    Musculoskeletal:     Cervical back: Normal range of motion and neck supple.     Right lower leg: No edema.     Left lower leg: No edema.  Skin:    General: Skin is warm and dry.     Findings: No rash.  Neurological:     General: No focal deficit present.     Mental Status: She is alert. Mental status is at baseline.     Motor: No weakness.  Psychiatric:        Mood and Affect: Mood normal.     ED Results / Procedures / Treatments   Labs (all labs ordered are listed, but only abnormal results are displayed) Labs Reviewed  CBC - Abnormal; Notable for the following components:      Result Value  MCV 78.8 (*)    MCH 25.8 (*)    All other components within normal limits  LIPASE, BLOOD  COMPREHENSIVE METABOLIC PANEL WITH GFR  URINALYSIS, ROUTINE W REFLEX MICROSCOPIC  PREGNANCY, URINE    EKG None  Radiology CT ABDOMEN PELVIS W CONTRAST Result Date: 07/13/2023 CLINICAL DATA:  Right upper quadrant abdominal pain radiating to back, nausea/vomiting/diarrhea EXAM: CT ABDOMEN AND PELVIS WITH CONTRAST TECHNIQUE: Multidetector CT imaging of the abdomen and pelvis was performed using the standard protocol following bolus administration of intravenous contrast. RADIATION DOSE REDUCTION: This exam was performed according to the departmental dose-optimization program which includes automated exposure control, adjustment of the mA and/or kV according to patient size and/or use of iterative reconstruction technique. CONTRAST:  100mL OMNIPAQUE IOHEXOL 300 MG/ML  SOLN COMPARISON:  None  Available. FINDINGS: Lower chest: No acute pleural or parenchymal lung disease. Hepatobiliary: No focal liver abnormality is seen. No gallstones, gallbladder wall thickening, or biliary dilatation. Pancreas: Unremarkable. No pancreatic ductal dilatation or surrounding inflammatory changes. Spleen: Normal in size without focal abnormality. Adrenals/Urinary Tract: Adrenal glands are unremarkable. Kidneys are normal, without renal calculi, focal lesion, or hydronephrosis. Bladder is unremarkable. Stomach/Bowel: No bowel obstruction or ileus. Normal appendix central lower abdomen. No bowel wall thickening or inflammatory change. Vascular/Lymphatic: No significant vascular findings are present. No enlarged abdominal or pelvic lymph nodes. Reproductive: Uterus and bilateral adnexa are unremarkable. Other: No free fluid or free intraperitoneal gas. No abdominal wall hernia. Musculoskeletal: No acute or destructive bony abnormalities. Reconstructed images demonstrate no additional findings. IMPRESSION: 1. No acute intra-abdominal or intrapelvic process. Normal appendix. Electronically Signed   By: Bobbye Burrow M.D.   On: 07/13/2023 23:38    Procedures Procedures    Medications Ordered in ED Medications  ondansetron  (ZOFRAN -ODT) disintegrating tablet 4 mg (has no administration in time range)    ED Course/ Medical Decision Making/ A&P    Patient seen and examined. History obtained directly from patient. Work-up including labs, imaging, EKG ordered in triage, if performed, were reviewed.    Labs/EKG: Independently reviewed and interpreted.  This included: CBC with normal white blood cell count and hemoglobin; CMP unremarkable with normal transaminases and bilirubin; lipase normal.  UA and pregnancy pending.  Imaging: Considered right upper quadrant ultrasound versus CT imaging of the abdomen pelvis.  Ultrasound not currently available at the facility.  Patient without Abigail Abler sign.  Will evaluate with CT  abdomen pelvis.  Patient made aware of possibility of need to return tomorrow for ultrasound with any abnormalities.  Medications/Fluids: None ordered  Most recent vital signs reviewed and are as follows: BP 113/73   Pulse 97   Temp 98.1 F (36.7 C) (Oral)   Resp 18   Ht 5\' 4"  (1.626 m)   Wt 102.5 kg   SpO2 99%   BMI 38.79 kg/m   Initial impression: Upper abdominal pain  11:48 PM Reassessment performed. Patient appears comfortable.  Labs personally reviewed and interpreted including: UA without signs of infection, pregnancy negative.  Imaging personally visualized and interpreted including: CT abdomen pelvis, agree no acute findings.  Gallbladder appears normal.  Reviewed pertinent lab work and imaging with patient at bedside. Questions answered.   Most current vital signs reviewed and are as follows: BP 100/63   Pulse 67   Temp 98.1 F (36.7 C) (Oral)   Resp 14   Ht 5\' 4"  (1.626 m)   Wt 102.5 kg   SpO2 99%   BMI 38.79 kg/m   Plan: Discharge to  home.   Prescriptions written for: Pantoprazole  (previously prescribed, patient is currently out), Zofran   Other home care instructions discussed: Nida Barrow diet, maintain good hydration, avoid any foods which would make symptoms worse.  ED return instructions discussed: The patient was urged to return to the Emergency Department immediately with worsening of current symptoms, worsening abdominal pain, persistent vomiting, blood noted in stools, fever, or any other concerns. The patient verbalized understanding.   Follow-up instructions discussed: Patient encouraged to follow-up with their PCP in 7 days.                                 Medical Decision Making Amount and/or Complexity of Data Reviewed Labs: ordered. Radiology: ordered.  Risk Prescription drug management.   For this patient's complaint of abdominal pain, the following conditions were considered on the differential diagnosis: gastritis/PUD,  enteritis/duodenitis, appendicitis, cholelithiasis/cholecystitis, cholangitis, pancreatitis, ruptured viscus, colitis, diverticulitis, small/large bowel obstruction, proctitis, cystitis, pyelonephritis, ureteral colic, aortic dissection, aortic aneurysm. In women, ectopic pregnancy, pelvic inflammatory disease, ovarian cysts, and tubo-ovarian abscess were also considered. Atypical chest etiologies were also considered including ACS, PE, and pneumonia.  Lab workup is very reassuring tonight.  CT scan without any obvious gallbladder problems or other findings.  Patient will be restarted on medicine for GERD and given nausea medicine to use as needed.  She appears well at time of discharge.  Very low concern for PE or pulmonary etiology.  Patient in no distress, no chest pain, no shortness of breath.  The patient's vital signs, pertinent lab work and imaging were reviewed and interpreted as discussed in the ED course. Hospitalization was considered for further testing, treatments, or serial exams/observation. However as patient is well-appearing, has a stable exam, and reassuring studies today, I do not feel that they warrant admission at this time. This plan was discussed with the patient who verbalizes agreement and comfort with this plan and seems reliable and able to return to the Emergency Department with worsening or changing symptoms.          Final Clinical Impression(s) / ED Diagnoses Final diagnoses:  Epigastric abdominal pain    Rx / DC Orders ED Discharge Orders          Ordered    pantoprazole  (PROTONIX ) 40 MG tablet  Daily        07/13/23 2346    ondansetron  (ZOFRAN -ODT) 4 MG disintegrating tablet  Every 8 hours PRN        07/13/23 2346              Lyna Sandhoff, PA-C 07/13/23 2350    Rolinda Climes, DO 07/14/23 1455

## 2023-08-14 ENCOUNTER — Encounter: Payer: Self-pay | Admitting: Family Medicine

## 2023-08-14 ENCOUNTER — Ambulatory Visit: Admitting: Family Medicine

## 2023-08-14 ENCOUNTER — Other Ambulatory Visit (HOSPITAL_COMMUNITY)
Admission: RE | Admit: 2023-08-14 | Discharge: 2023-08-14 | Disposition: A | Discharge status: Home / Self Care | Source: Ambulatory Visit | Attending: Family Medicine | Admitting: Family Medicine

## 2023-08-14 VITALS — BP 114/73 | HR 76 | Ht 64.0 in | Wt 229.0 lb

## 2023-08-14 DIAGNOSIS — W57XXXA Bitten or stung by nonvenomous insect and other nonvenomous arthropods, initial encounter: Secondary | ICD-10-CM

## 2023-08-14 DIAGNOSIS — Z23 Encounter for immunization: Secondary | ICD-10-CM

## 2023-08-14 DIAGNOSIS — N898 Other specified noninflammatory disorders of vagina: Secondary | ICD-10-CM

## 2023-08-14 DIAGNOSIS — N941 Unspecified dyspareunia: Secondary | ICD-10-CM

## 2023-08-14 MED ORDER — TRIAMCINOLONE ACETONIDE 0.1 % EX CREA: 1.0000 | TOPICAL_CREAM | Freq: Two times a day (BID) | CUTANEOUS | 0 refills | Status: AC

## 2023-08-14 NOTE — Progress Notes (Signed)
 BP 114/73   Pulse 76   Ht 5\' 4"  (1.626 m)   Wt 229 lb (103.9 kg)   SpO2 98%   BMI 39.31 kg/m    Subjective:   Patient ID: Nichole [name], female    DOB: 2000-07-17, 23 y.o.   MRN: [id number]  HPI: Nichole [name] is a 23 y.o. female presenting on 08/14/2023 for bug bites (Mainly upper body. Red, itching, burning. Benadryl  cream so help.)   HPI Vaginal discharge and pain with intercourse Patient is coming in today complaining of vaginal discharge and pain with intercourse.  She says she is pretty much at this since she became sexually active off and on and sometimes it gets worse and sometimes gets better.  She says it has been worse recently but she has been noticing a lot of discharge with it.  She says most of the time when she has intercourse it is painful once he penetrates.  Bug bites Patient has multiple scattered bug bites on her upper torso and some on her neck and a few on her arms.  She has may be 10 bites in total.  She feels like they came on over the past couple weeks, 1 at a time and then they are very itchy and swell up and she uses Benadryl  cream and it does help some but she still been getting them.  She thinks she does have ants in her house and thinks they may have bit her while she was sleeping.  She does have pets but said they recently got treated.  She denies any fevers or chills or redness or warmth just the small pink bug bites.  Relevant past medical, surgical, family and social history reviewed and updated as indicated. Interim medical history since our last visit reviewed. Allergies and medications reviewed and updated.  Review of Systems  Constitutional:  Negative for chills and fever.  HENT:  Negative for congestion, ear discharge and ear pain.   Eyes:  Negative for redness and visual disturbance.  Respiratory:  Negative for chest tightness and shortness of breath.   Cardiovascular:  Negative for chest pain and leg swelling.  Genitourinary:   Positive for dyspareunia, vaginal discharge and vaginal pain. Negative for difficulty urinating and dysuria.  Musculoskeletal:  Negative for back pain and gait problem.  Skin:  Positive for rash.  Neurological:  Negative for light-headedness and headaches.  Psychiatric/Behavioral:  Negative for agitation and behavioral problems.   All other systems reviewed and are negative.   Per HPI unless specifically indicated above   Allergies as of 08/14/2023       Reactions   Other Other (See Comments)   Patient has noted that one of her arms has become red/irritated/"bumpy" from an unknown/unnamed allergen to which she feels she's been exposed while in the hospital.        Medication List        Accurate as of Aug 14, 2023 12:14 PM. If you have any questions, ask your nurse or doctor.          albuterol  108 (90 Base) MCG/ACT inhaler Commonly known as: VENTOLIN  HFA Inhale 2 puffs into the lungs every 6 (six) hours as needed.   medroxyPROGESTERone  150 MG/ML injection Commonly known as: Depo-Provera  Inject 1 mL (150 mg total) into the muscle every 3 (three) months.   ondansetron  4 MG disintegrating tablet Commonly known as: ZOFRAN -ODT Take 1 tablet (4 mg total) by mouth every 8 (eight) hours as needed for nausea or vomiting.  pantoprazole  40 MG tablet Commonly known as: PROTONIX  Take 1 tablet (40 mg total) by mouth daily.   triamcinolone  cream 0.1 % Commonly known as: KENALOG  Apply 1 Application topically 2 (two) times daily. Started by: Lucio Sabin Nikhil Osei         Objective:   BP 114/73   Pulse 76   Ht 5\' 4"  (1.626 m)   Wt 229 lb (103.9 kg)   SpO2 98%   BMI 39.31 kg/m   Wt Readings from Last 3 Encounters:  08/14/23 229 lb (103.9 kg)  07/13/23 226 lb (102.5 kg)  07/03/23 228 lb 3.2 oz (103.5 kg)    Physical Exam Vitals and nursing note reviewed. Exam conducted with a chaperone present.  Constitutional:      General: She is not in acute distress.     Appearance: She is well-developed. She is not diaphoretic.  Eyes:     Conjunctiva/sclera: Conjunctivae normal.  Cardiovascular:     Rate and Rhythm: Normal rate and regular rhythm.     Heart sounds: Normal heart sounds. No murmur heard. Pulmonary:     Effort: Pulmonary effort is normal. No respiratory distress.     Breath sounds: Normal breath sounds. No wheezing.  Abdominal:     General: Abdomen is flat. Bowel sounds are normal. There is no distension.     Palpations: Abdomen is soft.     Tenderness: There is no abdominal tenderness. There is no guarding or rebound.  Genitourinary:    Exam position: Lithotomy position.     Labia:        Right: No rash or tenderness.        Left: No rash or tenderness.      Vagina: Vaginal discharge and tenderness present. No erythema.     Cervix: Discharge present. No erythema.     Uterus: Normal. Not enlarged and not tender.      Adnexa: Right adnexa normal and left adnexa normal.       Right: No mass or tenderness.         Left: No mass or tenderness.    Skin:    General: Skin is warm and dry.     Findings: Rash (Small pink papules, couple on her neck and a couple on her upper torso and a couple on each arm 1 on her upper back.) present.  Neurological:     Mental Status: She is alert and oriented to person, place, and time.     Coordination: Coordination normal.  Psychiatric:        Behavior: Behavior normal.       Assessment & Plan:   Problem List Items Addressed This Visit   None Visit Diagnoses       Bug bite, initial encounter    -  Primary   Relevant Medications   triamcinolone  cream (KENALOG ) 0.1 %     Encounter for immunization       Relevant Orders   Pneumococcal conjugate vaccine 20-valent (Completed)     Vaginal discharge       Relevant Orders   Cervicovaginal ancillary only   RPR   HepB+HepC+HIV Panel     Will send off for wet prep  Will also do STD testing  Sent triamcinolone  cream to help with bug bites as  needed.  Follow up plan: Return if symptoms worsen or fail to improve.  Counseling provided for all of the vaccine components Orders Placed This Encounter  Procedures   Pneumococcal conjugate vaccine 20-valent  RPR   HepB+HepC+HIV Panel    Jolyne Needs, MD [facility] 08/14/2023, 12:14 PM

## 2023-08-15 LAB — CERVICOVAGINAL ANCILLARY ONLY
Bacterial Vaginitis (gardnerella): NEGATIVE
Candida Glabrata: NEGATIVE
Candida Vaginitis: NEGATIVE
Chlamydia: NEGATIVE
Comment: NEGATIVE
Comment: NEGATIVE
Comment: NEGATIVE
Comment: NEGATIVE
Comment: NEGATIVE
Comment: NORMAL
Neisseria Gonorrhea: NEGATIVE
Trichomonas: NEGATIVE

## 2023-08-15 LAB — HEPB+HEPC+HIV PANEL
HIV Screen 4th Generation wRfx: NONREACTIVE
Hep B C IgM: NEGATIVE
Hep B Core Total Ab: NEGATIVE
Hep B E Ab: NONREACTIVE
Hep B E Ag: NEGATIVE
Hep B Surface Ab, Qual: UNDETERMINED
Hep C Virus Ab: NONREACTIVE
Hepatitis B Surface Ag: NEGATIVE

## 2023-08-15 LAB — RPR: RPR Ser Ql: NONREACTIVE

## 2023-08-16 ENCOUNTER — Ambulatory Visit: Payer: Self-pay

## 2023-08-16 ENCOUNTER — Ambulatory Visit: Payer: Self-pay | Admitting: Family Medicine

## 2023-08-16 NOTE — Telephone Encounter (Signed)
 Made an appt with Dr. Arther Bill 6/4 at 8:55am. Pt aware. She states that she had HA, loss of vision in one eye, N&V, BLE edema. Symptoms have improved. Awre to go to hospital/ urgent care if symptoms worsen or do not improve.

## 2023-08-16 NOTE — Addendum Note (Signed)
 Addended by: Michaela Adie B on: 08/16/2023 09:20 AM   Modules accepted: Orders

## 2023-08-16 NOTE — Telephone Encounter (Signed)
 Chief Complaint:  headache, foot pain, vision change Symptoms: none currently Frequency: one episode  Pertinent Negatives: Patient denies symptoms at this time. Denied any other symptoms during headache episode Disposition: [] ED /[] Urgent Care (no appt availability in office) / [] Appointment(In office/virtual)/ []  [place] [facility]/ [] Home Care/ [] Refused Recommended Disposition /[] [place] [facility]/ [x]  Follow-up with PCP Additional Notes:  OV 08/14/23. Last night developed headache with vision changes that resolved. She had symptoms for one hour. Started with distorted-wavy vision, then developed headache, right foot pain that radiated up her leg. She did vomit from headache. All symptoms resolved within one hour. Asymptomatic at this time. PCP follow up advised. Patient states she is expecting a call to schedule ultrasound and would like to have follow up same day if possible. Educated on care advice as documented in protocol, patient verbalized understanding. Discussed reasons to call back. Please follow up with Nichole [name].     Copied from CRM [id number]. Topic: Clinical - Red Word Triage >> Aug 16, 2023  9:45 AM Baldomero Bone wrote: Red Word that prompted transfer to Nurse Triage: Patient eyesight went out last night, foot started swelling and hurting when she laid down, along with a headache. Callback number [phone]. Reason for Disposition  Headache  [1] Brief (now gone) blurred vision AND [2] unexplained  Protocols used: Headache-A-AH, Vision Loss or Change-A-AH

## 2023-08-21 ENCOUNTER — Ambulatory Visit: Admitting: Family Medicine

## 2023-08-21 ENCOUNTER — Encounter: Payer: Self-pay | Admitting: Family Medicine

## 2023-08-21 VITALS — BP 103/71 | HR 85 | Ht 64.0 in | Wt 229.0 lb

## 2023-08-21 DIAGNOSIS — R1030 Lower abdominal pain, unspecified: Secondary | ICD-10-CM

## 2023-08-21 DIAGNOSIS — G43809 Other migraine, not intractable, without status migrainosus: Secondary | ICD-10-CM

## 2023-08-21 NOTE — Progress Notes (Signed)
 BP 103/71   Pulse 85   Ht 5\' 4"  (1.626 m)   Wt 229 lb (103.9 kg)   SpO2 98%   BMI 39.31 kg/m    Subjective:   Patient ID: Nichole [name], female    DOB: Sep 28, 2000, 23 y.o.   MRN: [id number]  HPI: Nichole [name] is a 23 y.o. female presenting on 08/21/2023 for Abdominal Pain and Migraine   HPI Lower abdominal pain Patient has had persistent issues with lower abdominal pain.  She has had some constipation and we treated that she says she is having regular bowel movements.  She says it still hurts with bowel movements but then will feel better at other times.  She says she is having normal bowel movements and not having to push too often.  She has done the bowel regiment and trying to change her diet.  She has had CT scans and urinalysis and vaginal swabs and have not come up with other answers.  Migraine headaches Patient is still having migraine headaches frequently.  They have not been lasting for multiple days but when they do come they do.  It hurts especially on the left frontal and parietal region.  She says when she goes and she turns out the lights she feels better.  Relevant past medical, surgical, family and social history reviewed and updated as indicated. Interim medical history since our last visit reviewed. Allergies and medications reviewed and updated.  Review of Systems  Constitutional:  Negative for chills and fever.  HENT:  Negative for congestion, ear discharge and ear pain.   Eyes:  Negative for visual disturbance.  Respiratory:  Negative for chest tightness and shortness of breath.   Cardiovascular:  Negative for chest pain and leg swelling.  Gastrointestinal:  Positive for abdominal distention, abdominal pain and constipation. Negative for nausea and vomiting.  Genitourinary:  Negative for difficulty urinating and dysuria.  Musculoskeletal:  Negative for back pain and gait problem.  Skin:  Negative for rash.  Neurological:  Positive for headaches.  Negative for dizziness, speech difficulty, weakness, light-headedness and numbness.  Psychiatric/Behavioral:  Negative for agitation and behavioral problems.   All other systems reviewed and are negative.   Per HPI unless specifically indicated above   Allergies as of 08/21/2023       Reactions   Other Other (See Comments)   Patient has noted that one of her arms has become red/irritated/"bumpy" from an unknown/unnamed allergen to which she feels she's been exposed while in the hospital.        Medication List        Accurate as of August 21, 2023  9:17 AM. If you have any questions, ask your nurse or doctor.          albuterol  108 (90 Base) MCG/ACT inhaler Commonly known as: VENTOLIN  HFA Inhale 2 puffs into the lungs every 6 (six) hours as needed.   medroxyPROGESTERone  150 MG/ML injection Commonly known as: Depo-Provera  Inject 1 mL (150 mg total) into the muscle every 3 (three) months.   ondansetron  4 MG disintegrating tablet Commonly known as: ZOFRAN -ODT Take 1 tablet (4 mg total) by mouth every 8 (eight) hours as needed for nausea or vomiting.   pantoprazole  40 MG tablet Commonly known as: PROTONIX  Take 1 tablet (40 mg total) by mouth daily.   triamcinolone  cream 0.1 % Commonly known as: KENALOG  Apply 1 Application topically 2 (two) times daily.         Objective:   BP 103/71  Pulse 85   Ht 5\' 4"  (1.626 m)   Wt 229 lb (103.9 kg)   SpO2 98%   BMI 39.31 kg/m   Wt Readings from Last 3 Encounters:  08/21/23 229 lb (103.9 kg)  08/14/23 229 lb (103.9 kg)  07/13/23 226 lb (102.5 kg)    Physical Exam Vitals and nursing note reviewed.  Constitutional:      General: She is not in acute distress.    Appearance: She is well-developed. She is not diaphoretic.  Eyes:     Conjunctiva/sclera: Conjunctivae normal.  Cardiovascular:     Rate and Rhythm: Normal rate and regular rhythm.     Heart sounds: Normal heart sounds. No murmur heard. Pulmonary:      Effort: Pulmonary effort is normal. No respiratory distress.     Breath sounds: Normal breath sounds. No wheezing.  Abdominal:     General: Abdomen is flat. Bowel sounds are normal. There is no distension.     Palpations: Abdomen is soft. There is no mass.     Tenderness: There is abdominal tenderness in the right lower quadrant, epigastric area, suprapubic area and left lower quadrant. There is no guarding or rebound.     Hernia: No hernia is present.  Musculoskeletal:        General: No tenderness. Normal range of motion.  Skin:    General: Skin is warm and dry.     Findings: No rash.  Neurological:     Mental Status: She is alert and oriented to person, place, and time.     Coordination: Coordination normal.  Psychiatric:        Behavior: Behavior normal.       Assessment & Plan:   Problem List Items Addressed This Visit   None Visit Diagnoses       Other migraine without status migrainosus, not intractable    -  Primary   Relevant Orders   Ambulatory referral to Neurology     Lower abdominal pain       Relevant Orders   Ambulatory referral to Gastroenterology       She has been taking naproxen  recently and that has upset her stomach some but it seems like most the abdominal pain is lower and intermittent constipation.  Will refer to gastroenterology.  Persistent issues with what appears to be migraines, not improving, she feels like they are worsening and more frequent, will refer to neurology. Follow up plan: Return if symptoms worsen or fail to improve.  Counseling provided for all of the vaccine components Orders Placed This Encounter  Procedures   Ambulatory referral to Neurology   Ambulatory referral to Gastroenterology    Jolyne Needs, MD [facility] 08/21/2023, 9:17 AM

## 2023-09-03 ENCOUNTER — Ambulatory Visit (HOSPITAL_COMMUNITY): Admission: RE | Admit: 2023-09-03 | Source: Ambulatory Visit

## 2023-09-10 ENCOUNTER — Ambulatory Visit (HOSPITAL_COMMUNITY): Admission: RE | Admit: 2023-09-10 | Source: Ambulatory Visit

## 2023-09-27 DIAGNOSIS — N915 Oligomenorrhea, unspecified: Secondary | ICD-10-CM | POA: Diagnosis not present

## 2023-09-27 DIAGNOSIS — N946 Dysmenorrhea, unspecified: Secondary | ICD-10-CM | POA: Diagnosis not present

## 2023-10-09 ENCOUNTER — Other Ambulatory Visit: Payer: Self-pay | Admitting: Family Medicine

## 2023-10-09 DIAGNOSIS — K219 Gastro-esophageal reflux disease without esophagitis: Secondary | ICD-10-CM

## 2023-10-09 DIAGNOSIS — R109 Unspecified abdominal pain: Secondary | ICD-10-CM

## 2023-10-10 ENCOUNTER — Encounter: Payer: Self-pay | Admitting: Family Medicine

## 2023-10-10 ENCOUNTER — Other Ambulatory Visit (HOSPITAL_COMMUNITY)
Admission: RE | Admit: 2023-10-10 | Discharge: 2023-10-10 | Disposition: A | Discharge status: Home / Self Care | Source: Ambulatory Visit | Attending: Family Medicine | Admitting: Family Medicine

## 2023-10-10 ENCOUNTER — Ambulatory Visit: Admitting: Family Medicine

## 2023-10-10 VITALS — BP 91/58 | HR 95 | Ht 64.0 in | Wt 216.0 lb

## 2023-10-10 DIAGNOSIS — Z124 Encounter for screening for malignant neoplasm of cervix: Secondary | ICD-10-CM | POA: Insufficient documentation

## 2023-10-10 DIAGNOSIS — Z01419 Encounter for gynecological examination (general) (routine) without abnormal findings: Secondary | ICD-10-CM | POA: Insufficient documentation

## 2023-10-10 DIAGNOSIS — N926 Irregular menstruation, unspecified: Secondary | ICD-10-CM

## 2023-10-10 LAB — PREGNANCY, URINE: Preg Test, Ur: NEGATIVE

## 2023-10-10 NOTE — Progress Notes (Signed)
 BP (!) 91/58   Pulse 95   Ht 5' 4 (1.626 m)   Wt 216 lb (98 kg)   LMP  (LMP Unknown)   SpO2 97%   BMI 37.08 kg/m    Subjective:   Patient ID: Nichole [name], female    DOB: 08/14/2000, 23 y.o.   MRN: [id number]  HPI: Nichole [name] is a 23 y.o. female presenting on 10/10/2023 for Medical Management of Chronic Issues (CPE with pap)   HPI Well woman exam and physical Patient denies any chest pain, shortness of breath, headaches or vision issues, abdominal complaints, diarrhea, nausea, vomiting, or joint issues.  Patient recently had right pregnancy and about a month and a half ago took a Plan B for it and then she tested positive after that about a month ago but then a couple weeks ago she tested negative.  She has not had a menstrual cycle since 2 months ago.  She is married and sexually active.  She has some vaginal irritation but she normally gets that.  She does have a little cramping going into her back still  Relevant past medical, surgical, family and social history reviewed and updated as indicated. Interim medical history since our last visit reviewed. Allergies and medications reviewed and updated.  Review of Systems  Constitutional:  Negative for chills and fever.  HENT:  Negative for congestion, ear discharge and ear pain.   Eyes:  Negative for redness and visual disturbance.  Respiratory:  Negative for chest tightness and shortness of breath.   Cardiovascular:  Negative for chest pain and leg swelling.  Gastrointestinal:  Positive for abdominal pain (Cramping).  Genitourinary:  Positive for menstrual problem, vaginal discharge and vaginal pain (A small amount of vaginal sensitivity is normal for her, this is no more than her normal). Negative for difficulty urinating, dysuria, flank pain, genital sores and vaginal bleeding.  Musculoskeletal:  Negative for back pain and gait problem.  Skin:  Negative for rash.  Neurological:  Negative for light-headedness and  headaches.  Psychiatric/Behavioral:  Negative for agitation and behavioral problems.   All other systems reviewed and are negative.   Per HPI unless specifically indicated above   Allergies as of 10/10/2023       Reactions   Other Other (See Comments)   Patient has noted that one of her arms has become red/irritated/bumpy from an unknown/unnamed allergen to which she feels she's been exposed while in the hospital.        Medication List        Accurate as of October 10, 2023  3:19 PM. If you have any questions, ask your nurse or doctor.          STOP taking these medications    medroxyPROGESTERone  150 MG/ML injection Commonly known as: Depo-Provera  Stopped by: Fonda LABOR Jatoria Kneeland       TAKE these medications    albuterol  108 (90 Base) MCG/ACT inhaler Commonly known as: VENTOLIN  HFA Inhale 2 puffs into the lungs every 6 (six) hours as needed.   ondansetron  4 MG disintegrating tablet Commonly known as: ZOFRAN -ODT Take 1 tablet (4 mg total) by mouth every 8 (eight) hours as needed for nausea or vomiting.   pantoprazole  40 MG tablet Commonly known as: PROTONIX  TAKE 1 TABLET BY MOUTH EVERY DAY   triamcinolone  cream 0.1 % Commonly known as: KENALOG  Apply 1 Application topically 2 (two) times daily.         Objective:   BP (!) 91/58   Pulse  95   Ht 5' 4 (1.626 m)   Wt 216 lb (98 kg)   LMP  (LMP Unknown)   SpO2 97%   BMI 37.08 kg/m   Wt Readings from Last 3 Encounters:  10/10/23 216 lb (98 kg)  08/21/23 229 lb (103.9 kg)  08/14/23 229 lb (103.9 kg)    Physical Exam Vitals reviewed.  Constitutional:      General: She is not in acute distress.    Appearance: She is well-developed. She is not diaphoretic.  Eyes:     Conjunctiva/sclera: Conjunctivae normal.  Neck:     Thyroid : No thyromegaly.  Cardiovascular:     Rate and Rhythm: Normal rate and regular rhythm.     Heart sounds: Normal heart sounds. No murmur heard. Pulmonary:     Effort:  Pulmonary effort is normal. No respiratory distress.     Breath sounds: Normal breath sounds. No wheezing.  Chest:  Breasts:    Breasts are symmetrical.     Right: No inverted nipple, mass, nipple discharge, skin change or tenderness.     Left: Skin change (Small pimple in left areola that just started, recommended for her to do a topical antibiotic cream and watch it for the next 2 weeks if not improved then let us  know) present. No inverted nipple, mass, nipple discharge or tenderness.  Abdominal:     General: Bowel sounds are normal. There is no distension.     Palpations: Abdomen is soft.     Tenderness: There is no abdominal tenderness. There is no guarding or rebound.     Hernia: No hernia is present.  Genitourinary:    Exam position: Supine.     Labia:        Right: No rash or lesion.        Left: No rash or lesion.      Vagina: Normal.     Cervix: No cervical motion tenderness, discharge or friability.     Uterus: Not deviated, not enlarged, not fixed and not tender.      Adnexa:        Right: No mass or tenderness.         Left: No mass or tenderness.    Musculoskeletal:        General: Normal range of motion.     Cervical back: Neck supple.  Lymphadenopathy:     Cervical: No cervical adenopathy.  Skin:    General: Skin is warm and dry.     Findings: Lesion (Small pimple in her left areola, just started a few days ago, recommended topical antibiotic cream and watch it for the next 2 or 3 weeks and if not improved from there then please let us  know) present. No rash.  Neurological:     Mental Status: She is alert and oriented to person, place, and time.     Coordination: Coordination normal.  Psychiatric:        Behavior: Behavior normal.       Assessment & Plan:   Problem List Items Addressed This Visit   None Visit Diagnoses       Well woman exam with routine gynecological exam    -  Primary   Relevant Orders   Cytology - PAP([place])     Missed menses        Relevant Orders   Pregnancy, urine     Discussed birth control and pregnancy prevention or if she desires pregnancy.  She even though she did the Plan  B she said she would be okay with getting pregnant at this point because she is changing jobs.  Will do urine pregnancy today she declines birth control at this point.  Urine pregnancy was negative and we discussed birth control options and she wants to not do any birth control options at this point.  Follow up plan: Return if symptoms worsen or fail to improve.  Counseling provided for all of the vaccine components Orders Placed This Encounter  Procedures   Pregnancy, urine    Fonda Levins, MD [facility] 10/10/2023, 3:19 PM

## 2023-10-16 LAB — CYTOLOGY - PAP
Adequacy: ABSENT
Diagnosis: NEGATIVE

## 2023-10-17 ENCOUNTER — Ambulatory Visit: Payer: Self-pay | Admitting: Family Medicine

## 2023-11-06 DIAGNOSIS — T148XXA Other injury of unspecified body region, initial encounter: Secondary | ICD-10-CM | POA: Diagnosis not present

## 2023-11-06 DIAGNOSIS — M00031 Staphylococcal arthritis, right wrist: Secondary | ICD-10-CM | POA: Diagnosis not present

## 2023-11-06 DIAGNOSIS — M25531 Pain in right wrist: Secondary | ICD-10-CM | POA: Diagnosis not present

## 2023-11-06 DIAGNOSIS — R0789 Other chest pain: Secondary | ICD-10-CM | POA: Diagnosis not present

## 2023-11-06 DIAGNOSIS — R110 Nausea: Secondary | ICD-10-CM | POA: Diagnosis not present

## 2024-01-02 ENCOUNTER — Telehealth: Payer: Self-pay

## 2024-01-02 NOTE — Telephone Encounter (Signed)
 Spoke with pt. She is fine coming to [place] or [address].  [facility] in [place]. Messaged pt in mychart with there info. Pt did not have a pen and paper when speaking to her. She states that she has a very sensitive tooth that she needs looked at.

## 2024-01-02 NOTE — Telephone Encounter (Signed)
 Copied from CRM [id number]. Topic: Referral - Request for Referral >> Jan 02, 2024 10:23 AM Harlene ORN wrote: Patient is requesting her PCP to recommend her a dentist that is close in her area. Please call back the patient to discuss.

## 2024-03-02 ENCOUNTER — Encounter: Payer: Self-pay | Admitting: Family Medicine

## 2024-03-02 ENCOUNTER — Ambulatory Visit: Admitting: Family Medicine

## 2024-03-02 ENCOUNTER — Ambulatory Visit: Admitting: Nurse Practitioner

## 2024-03-02 VITALS — BP 104/67 | HR 72 | Temp 97.2°F | Ht 64.0 in | Wt 201.0 lb

## 2024-03-02 DIAGNOSIS — G44219 Episodic tension-type headache, not intractable: Secondary | ICD-10-CM | POA: Diagnosis not present

## 2024-03-02 DIAGNOSIS — Z3009 Encounter for other general counseling and advice on contraception: Secondary | ICD-10-CM

## 2024-03-02 DIAGNOSIS — N912 Amenorrhea, unspecified: Secondary | ICD-10-CM | POA: Diagnosis not present

## 2024-03-02 LAB — PREGNANCY, URINE: Preg Test, Ur: NEGATIVE

## 2024-03-02 MED ORDER — NORELGESTROMIN-ETH ESTRADIOL 150-35 MCG/24HR TD PTWK: 1.0000 | MEDICATED_PATCH | TRANSDERMAL | 12 refills | Status: AC

## 2024-03-02 NOTE — Progress Notes (Signed)
 BP 104/67   Pulse 72   Temp (!) 97.2 F (36.2 C)   Ht 5' 4 (1.626 m)   Wt 201 lb (91.2 kg)   SpO2 98%   BMI 34.50 kg/m    Subjective:   Patient ID: Nichole [name], female    DOB: [birth year], 23 y.o.   MRN: [id number]  HPI: Nichole [name] is a 23 y.o. female presenting on 03/02/2024 for Positive home pregnancy test   Discussed the use of AI scribe software for clinical note transcription with the patient, who gave verbal consent to proceed.  History of Present Illness   Nichole [name] is a 23 year old female who presents to discuss birth control options.  Contraceptive management - Interested in resuming birth control, specifically considering the birth control patch to avoid daily oral medication - No positive pregnancy test since July - Previously used Depo-Provera  injection and found it acceptable, but concerned about potential weight gain due to recent significant weight loss - History of allergic reaction to Nexplanon , including burning sensation and discomfort in the arm  Cephalalgia - Experiences headaches - Not currently taking any medications for pain          Relevant past medical, surgical, family and social history reviewed and updated as indicated. Interim medical history since our last visit reviewed. Allergies and medications reviewed and updated.  Review of Systems  Constitutional:  Negative for chills and fever.  Eyes:  Negative for visual disturbance.  Respiratory:  Negative for chest tightness and shortness of breath.   Cardiovascular:  Negative for chest pain and leg swelling.  Genitourinary:  Negative for difficulty urinating, dysuria, menstrual problem and urgency.  Musculoskeletal:  Negative for back pain and gait problem.  Skin:  Negative for rash.  Neurological:  Positive for headaches. Negative for light-headedness.  Psychiatric/Behavioral:  Negative for agitation and behavioral problems.   All other systems reviewed and are  negative.   Per HPI unless specifically indicated above   Allergies as of 03/02/2024       Reactions   Other Other (See Comments)   Patient has noted that one of her arms has become red/irritated/bumpy from an unknown/unnamed allergen to which she feels she's been exposed while in the hospital.        Medication List        Accurate as of March 02, 2024  4:43 PM. If you have any questions, ask your nurse or doctor.          STOP taking these medications    albuterol  108 (90 Base) MCG/ACT inhaler Commonly known as: VENTOLIN  HFA Stopped by: Fonda Levins, MD   ondansetron  4 MG disintegrating tablet Commonly known as: ZOFRAN -ODT Stopped by: Fonda Levins, MD   pantoprazole  40 MG tablet Commonly known as: PROTONIX  Stopped by: Fonda Levins, MD       TAKE these medications    norelgestromin -ethinyl estradiol  150-35 MCG/24HR transdermal patch Commonly known as: XULANE Place 1 patch onto the skin once a week. 1 patch weekly for 3 weeks then skip the fourth week and then restart for 3 weeks again Started by: Fonda Levins, MD   triamcinolone  cream 0.1 % Commonly known as: KENALOG  Apply 1 Application topically 2 (two) times daily.         Objective:   BP 104/67   Pulse 72   Temp (!) 97.2 F (36.2 C)   Ht 5' 4 (1.626 m)   Wt 201 lb (91.2 kg)   SpO2 98%  BMI 34.50 kg/m   Wt Readings from Last 3 Encounters:  03/02/24 201 lb (91.2 kg)  10/10/23 216 lb (98 kg)  08/21/23 229 lb (103.9 kg)    Physical Exam Vitals and nursing note reviewed.  Constitutional:      Appearance: Normal appearance.  Musculoskeletal:        General: No swelling.  Neurological:     Mental Status: She is alert.  Psychiatric:        Mood and Affect: Mood normal.        Behavior: Behavior normal.        Thought Content: Thought content normal.               Results for orders placed or performed in visit on 03/02/24  Pregnancy, urine   Collection  Time: 03/02/24  4:01 PM  Result Value Ref Range   Preg Test, Ur Negative Negative    Assessment & Plan:   Problem List Items Addressed This Visit   None Visit Diagnoses       Birth control counseling    -  Primary   Relevant Medications   norelgestromin -ethinyl estradiol  (XULANE) 150-35 MCG/24HR transdermal patch   Other Relevant Orders   Pregnancy, urine (Completed)     Episodic tension-type headache, not intractable             Woman's Wellness Visit Routine wellness visit with discussion on pain management and medication use. - Advised moderation in Tylenol  and Aleve  use. - Recommended alternating between Tylenol  and Aleve  for pain management.  Contraceptive management Desires to restart birth control. Previously used Depo shot and Nexplanon , but experienced allergic reaction to Nexplanon . Interested in trying the birth control patch. Discussed various contraceptive options including birth control pills, patch, Nexplanon , NuvaRing, and IUD. Prefers the patch due to ease of use and no daily intake required. Informed about potential for lighter periods with the patch and the importance of proper placement to avoid dislodgement. - Prescribed birth control patch. - Sent prescription to pharmacy. - Advised on proper placement of the patch to avoid dislodgement.          Follow up plan: Return if symptoms worsen or fail to improve.  Counseling provided for all of the vaccine components Orders Placed This Encounter  Procedures   Pregnancy, urine    Fonda Levins, MD [facility] 03/02/2024, 4:43 PM

## 2024-03-23 ENCOUNTER — Encounter (HOSPITAL_BASED_OUTPATIENT_CLINIC_OR_DEPARTMENT_OTHER): Payer: Self-pay | Admitting: *Deleted

## 2024-03-23 ENCOUNTER — Other Ambulatory Visit: Payer: Self-pay

## 2024-03-23 ENCOUNTER — Emergency Department (HOSPITAL_BASED_OUTPATIENT_CLINIC_OR_DEPARTMENT_OTHER)
Admission: EM | Admit: 2024-03-23 | Discharge: 2024-03-23 | Disposition: A | Discharge status: Home / Self Care | Attending: Emergency Medicine | Admitting: Emergency Medicine

## 2024-03-23 DIAGNOSIS — R0981 Nasal congestion: Secondary | ICD-10-CM | POA: Insufficient documentation

## 2024-03-23 DIAGNOSIS — N939 Abnormal uterine and vaginal bleeding, unspecified: Secondary | ICD-10-CM | POA: Diagnosis present

## 2024-03-23 DIAGNOSIS — M545 Low back pain, unspecified: Secondary | ICD-10-CM | POA: Insufficient documentation

## 2024-03-23 DIAGNOSIS — R1030 Lower abdominal pain, unspecified: Secondary | ICD-10-CM | POA: Insufficient documentation

## 2024-03-23 DIAGNOSIS — R059 Cough, unspecified: Secondary | ICD-10-CM | POA: Diagnosis not present

## 2024-03-23 LAB — CBC WITH DIFFERENTIAL/PLATELET
Abs Immature Granulocytes: 0.01 K/uL (ref 0.00–0.07)
Basophils Absolute: 0 K/uL (ref 0.0–0.1)
Basophils Relative: 0 %
Eosinophils Absolute: 0 K/uL (ref 0.0–0.5)
Eosinophils Relative: 1 %
HCT: 38.2 % (ref 36.0–46.0)
Hemoglobin: 13.3 g/dL (ref 12.0–15.0)
Immature Granulocytes: 0 %
Lymphocytes Relative: 49 %
Lymphs Abs: 2.5 K/uL (ref 0.7–4.0)
MCH: 29.4 pg (ref 26.0–34.0)
MCHC: 34.8 g/dL (ref 30.0–36.0)
MCV: 84.5 fL (ref 80.0–100.0)
Monocytes Absolute: 0.3 K/uL (ref 0.1–1.0)
Monocytes Relative: 5 %
Neutro Abs: 2.4 K/uL (ref 1.7–7.7)
Neutrophils Relative %: 45 %
Platelets: 225 K/uL (ref 150–400)
RBC: 4.52 MIL/uL (ref 3.87–5.11)
RDW: 12.4 % (ref 11.5–15.5)
WBC: 5.2 K/uL (ref 4.0–10.5)
nRBC: 0 % (ref 0.0–0.2)

## 2024-03-23 LAB — COMPREHENSIVE METABOLIC PANEL WITH GFR
ALT: 14 U/L (ref 0–44)
AST: 18 U/L (ref 15–41)
Albumin: 4.4 g/dL (ref 3.5–5.0)
Alkaline Phosphatase: 75 U/L (ref 38–126)
Anion gap: 11 (ref 5–15)
BUN: 8 mg/dL (ref 6–20)
CO2: 26 mmol/L (ref 22–32)
Calcium: 10.3 mg/dL (ref 8.9–10.3)
Chloride: 101 mmol/L (ref 98–111)
Creatinine, Ser: 0.48 mg/dL (ref 0.44–1.00)
GFR, Estimated: >60 mL/min
Glucose, Bld: 88 mg/dL (ref 70–99)
Potassium: 4.1 mmol/L (ref 3.5–5.1)
Sodium: 138 mmol/L (ref 135–145)
Total Bilirubin: 0.3 mg/dL (ref 0.0–1.2)
Total Protein: 7.9 g/dL (ref 6.5–8.1)

## 2024-03-23 LAB — HCG, SERUM, QUALITATIVE: Preg, Serum: NEGATIVE

## 2024-03-23 NOTE — ED Provider Notes (Signed)
 " [place] EMERGENCY DEPARTMENT AT [facility] Provider Note   CSN: [id number] Arrival date & time: 03/23/24  1430     Patient presents with: Vaginal Bleeding   Nichole [name] is a 24 y.o. female.   24 y.o. female  was evaluated in triage.  Pt complains of vaginal bleeding x 2 weeks, lower abdominal pain and lower back pain, heavy bleeding at times. Also with cough, congestion, unsure if related to or causing the bleeding. Feels shaky/weak.  Currently using pads and tampons, has used 2 of each today.           Prior to Admission medications  Medication Sig Start Date End Date Taking? Authorizing Provider  norelgestromin -ethinyl estradiol  (XULANE) 150-35 MCG/24HR transdermal patch Place 1 patch onto the skin once a week. 1 patch weekly for 3 weeks then skip the fourth week and then restart for 3 weeks again 03/02/24   Dettinger, Fonda LABOR, MD  triamcinolone  cream (KENALOG ) 0.1 % Apply 1 Application topically 2 (two) times daily. 08/14/23   Dettinger, Fonda LABOR, MD    Allergies: Other    Review of Systems Negative except as per HPI  Updated Vital Signs BP 108/73   Pulse 78   Temp 98.1 F (36.7 C)   Resp 17   LMP 03/09/2024   SpO2 99%   Physical Exam Vitals and nursing note reviewed.  Constitutional:      General: She is not in acute distress.    Appearance: She is well-developed. She is not diaphoretic.  HENT:     Head: Normocephalic and atraumatic.  Pulmonary:     Effort: Pulmonary effort is normal.  Abdominal:     Palpations: Abdomen is soft.     Tenderness: There is no abdominal tenderness. There is no right CVA tenderness or left CVA tenderness.  Musculoskeletal:        General: No swelling or tenderness.     Lumbar back: No tenderness or bony tenderness.  Skin:    General: Skin is warm and dry.     Findings: No erythema or rash.  Neurological:     Mental Status: She is alert and oriented to person, place, and time.  Psychiatric:         Behavior: Behavior normal.     (all labs ordered are listed, but only abnormal results are displayed) Labs Reviewed  COMPREHENSIVE METABOLIC PANEL WITH GFR  CBC WITH DIFFERENTIAL/PLATELET  HCG, SERUM, QUALITATIVE    EKG: None  Radiology: No results found.   Procedures   Medications Ordered in the ED - No data to display                                  Medical Decision Making Amount and/or Complexity of Data Reviewed Labs: ordered.   This patient presents to the ED for concern of vaginal bleeding, this involves an extensive number of treatment options, and is a complaint that carries with it a high risk of complications and morbidity.  The differential diagnosis includes but not limited to pregnancy related bleeding, anemia, dysfunctional uterine bleeding   Co morbidities / Chronic conditions that complicate the patient evaluation  Asthma   Additional history obtained:  Additional history obtained from EMR External records from outside source obtained and reviewed including prior visit to PCP dated 03/02/2024 where she was started on birth control patch.   Lab Tests:  I Ordered, and personally interpreted labs.  The pertinent results include: hCG negative.  CBC with normal H&H.  CMP within normals.   Problem List / ED Course / Critical interventions / Medication management  24 year old female presents emergency room with concern for abnormal vaginal bleeding.  Patient recently started on birth control patch.  She started having bleeding 3 days into her week 3 patch at which point she decided to remove the patch and has not placed a new patch.  She is well-appearing on exam, her abdomen is soft nontender, there is no CVA tenderness.  Labs reviewed and reassuring, she is hemodynamically stable.  Recommend she discuss birth control options with her primary care provider and follow-up. I have reviewed the patients home medicines and have made adjustments as  needed   Social Determinants of Health:  Has PCP   Test / Admission - Considered:  Stable for dc      Final diagnoses:  Vaginal bleeding    ED Discharge Orders     None          Beverley Leita DELENA DEVONNA 03/23/24 1950    Doretha Folks, MD 03/24/24 1516  "

## 2024-03-23 NOTE — ED Triage Notes (Signed)
 Pt is here for evaluation of vaginal bleeding x 2 weeks.  Pt has been having lower abdominal and lower back pain with this.  Bleeding was intermittently heavy but then would slow down.  Pt was also feeling unwell with URI.  Pt has been feeling shaky.

## 2024-03-23 NOTE — ED Provider Triage Note (Signed)
 Emergency Medicine Provider Triage Evaluation Note  Nichole [name] , a 24 y.o. female  was evaluated in triage.  Pt complains of vaginal bleeding x 2 weeks, lower abdominal pain and lower back pain, heavy bleeding at times. Also with cough, congestion, unsure if related to or causing the bleeding. Feels shaky/weak.  Currently using pads and tampons, has used 2 of each today.   Review of Systems  Positive: Lower abdominal pain, low back pain, vaginal bleeding Negative:   Physical Exam  BP 116/77 (BP Location: Right Arm)   Pulse 79   Temp 98.5 F (36.9 C)   Resp 16   SpO2 99%  Gen:   Awake, no distress   Resp:  Normal effort  MSK:   Moves extremities without difficulty  Other:    Medical Decision Making  Medically screening exam initiated at 2:43 PM.  Appropriate orders placed.  Albirda Nava-[name] was informed that the remainder of the evaluation will be completed by another provider, this initial triage assessment does not replace that evaluation, and the importance of remaining in the ED until their evaluation is complete.     Beverley Leita LABOR, PA-C 03/23/24 1445

## 2024-03-23 NOTE — Discharge Instructions (Signed)
 Follow up with your primary care to discuss birth control options.
